# Patient Record
Sex: Male | Born: 1950 | Race: White | Hispanic: No | Marital: Married | Smoking: Former smoker
Health system: Southern US, Community
[De-identification: ages and names within clinical notes are randomized; demographics above are authoritative.]

## PROBLEM LIST (undated history)

## (undated) DIAGNOSIS — C801 Malignant (primary) neoplasm, unspecified: Secondary | ICD-10-CM

## (undated) DIAGNOSIS — K219 Gastro-esophageal reflux disease without esophagitis: Secondary | ICD-10-CM

## (undated) DIAGNOSIS — E785 Hyperlipidemia, unspecified: Secondary | ICD-10-CM

## (undated) DIAGNOSIS — M1990 Unspecified osteoarthritis, unspecified site: Secondary | ICD-10-CM

## (undated) DIAGNOSIS — T7840XA Allergy, unspecified, initial encounter: Secondary | ICD-10-CM

## (undated) DIAGNOSIS — R519 Headache, unspecified: Secondary | ICD-10-CM

## (undated) DIAGNOSIS — I10 Essential (primary) hypertension: Secondary | ICD-10-CM

## (undated) DIAGNOSIS — L089 Local infection of the skin and subcutaneous tissue, unspecified: Secondary | ICD-10-CM

## (undated) DIAGNOSIS — Z8619 Personal history of other infectious and parasitic diseases: Secondary | ICD-10-CM

## (undated) DIAGNOSIS — Z87442 Personal history of urinary calculi: Secondary | ICD-10-CM

## (undated) DIAGNOSIS — R296 Repeated falls: Secondary | ICD-10-CM

## (undated) DIAGNOSIS — B958 Unspecified staphylococcus as the cause of diseases classified elsewhere: Secondary | ICD-10-CM

## (undated) DIAGNOSIS — H269 Unspecified cataract: Secondary | ICD-10-CM

## (undated) DIAGNOSIS — F419 Anxiety disorder, unspecified: Secondary | ICD-10-CM

## (undated) DIAGNOSIS — I8390 Asymptomatic varicose veins of unspecified lower extremity: Secondary | ICD-10-CM

## (undated) DIAGNOSIS — H9319 Tinnitus, unspecified ear: Secondary | ICD-10-CM

## (undated) DIAGNOSIS — S42002A Fracture of unspecified part of left clavicle, initial encounter for closed fracture: Secondary | ICD-10-CM

## (undated) HISTORY — PX: TONGUE SURGERY: SHX810

## (undated) HISTORY — PX: I & D EXTREMITY: SHX5045

## (undated) HISTORY — PX: BRISTOW PROCEDURE: SHX5186

## (undated) HISTORY — PX: OTHER SURGICAL HISTORY: SHX169

## (undated) HISTORY — DX: Allergy, unspecified, initial encounter: T78.40XA

## (undated) HISTORY — PX: TOTAL KNEE ARTHROPLASTY: SHX125

## (undated) HISTORY — PX: LITHOTRIPSY: SUR834

## (undated) HISTORY — PX: PICC LINE PLACE PERIPHERAL (ARMC HX): HXRAD1248

## (undated) HISTORY — PX: JOINT REPLACEMENT: SHX530

## (undated) HISTORY — PX: SHOULDER ARTHROSCOPY: SHX128

## (undated) HISTORY — PX: LEG SURGERY: SHX1003

## (undated) HISTORY — DX: Hyperlipidemia, unspecified: E78.5

## (undated) HISTORY — PX: NASAL TURBINATE REDUCTION: SHX2072

## (undated) HISTORY — DX: Unspecified cataract: H26.9

---

## 2007-03-13 ENCOUNTER — Ambulatory Visit: Payer: Self-pay | Admitting: Gastroenterology

## 2007-03-25 ENCOUNTER — Ambulatory Visit: Payer: Self-pay | Admitting: Gastroenterology

## 2007-03-25 ENCOUNTER — Encounter: Payer: Self-pay | Admitting: Gastroenterology

## 2011-10-16 DIAGNOSIS — C801 Malignant (primary) neoplasm, unspecified: Secondary | ICD-10-CM

## 2011-10-16 HISTORY — DX: Malignant (primary) neoplasm, unspecified: C80.1

## 2011-10-19 DIAGNOSIS — I8390 Asymptomatic varicose veins of unspecified lower extremity: Secondary | ICD-10-CM | POA: Insufficient documentation

## 2011-10-19 DIAGNOSIS — R0683 Snoring: Secondary | ICD-10-CM | POA: Insufficient documentation

## 2011-11-07 DIAGNOSIS — K148 Other diseases of tongue: Secondary | ICD-10-CM | POA: Insufficient documentation

## 2011-11-23 DIAGNOSIS — C029 Malignant neoplasm of tongue, unspecified: Secondary | ICD-10-CM | POA: Insufficient documentation

## 2011-11-28 DIAGNOSIS — C01 Malignant neoplasm of base of tongue: Secondary | ICD-10-CM | POA: Insufficient documentation

## 2012-04-11 DIAGNOSIS — Z9660 Presence of unspecified orthopedic joint implant: Secondary | ICD-10-CM | POA: Insufficient documentation

## 2012-04-11 DIAGNOSIS — Z9889 Other specified postprocedural states: Secondary | ICD-10-CM | POA: Insufficient documentation

## 2013-03-31 DIAGNOSIS — M899 Disorder of bone, unspecified: Secondary | ICD-10-CM | POA: Insufficient documentation

## 2013-03-31 DIAGNOSIS — S3991XA Unspecified injury of abdomen, initial encounter: Secondary | ICD-10-CM | POA: Insufficient documentation

## 2014-04-22 DIAGNOSIS — M799 Soft tissue disorder, unspecified: Secondary | ICD-10-CM | POA: Insufficient documentation

## 2014-04-22 DIAGNOSIS — M2550 Pain in unspecified joint: Secondary | ICD-10-CM | POA: Insufficient documentation

## 2014-07-07 ENCOUNTER — Encounter: Payer: Self-pay | Admitting: Emergency Medicine

## 2014-07-07 ENCOUNTER — Emergency Department (INDEPENDENT_AMBULATORY_CARE_PROVIDER_SITE_OTHER): Payer: 59

## 2014-07-07 ENCOUNTER — Emergency Department
Admission: EM | Admit: 2014-07-07 | Discharge: 2014-07-07 | Disposition: A | Discharge status: Home / Self Care | Payer: 59 | Source: Home / Self Care | Attending: Family Medicine | Admitting: Family Medicine

## 2014-07-07 DIAGNOSIS — S70359A Superficial foreign body, unspecified thigh, initial encounter: Secondary | ICD-10-CM

## 2014-07-07 DIAGNOSIS — Z1810 Retained metal fragments, unspecified: Secondary | ICD-10-CM

## 2014-07-07 DIAGNOSIS — S70259A Superficial foreign body, unspecified hip, initial encounter: Secondary | ICD-10-CM

## 2014-07-07 DIAGNOSIS — S80859A Superficial foreign body, unspecified lower leg, initial encounter: Secondary | ICD-10-CM

## 2014-07-07 DIAGNOSIS — IMO0002 Reserved for concepts with insufficient information to code with codable children: Secondary | ICD-10-CM

## 2014-07-07 DIAGNOSIS — S80851A Superficial foreign body, right lower leg, initial encounter: Secondary | ICD-10-CM

## 2014-07-07 DIAGNOSIS — S90559A Superficial foreign body, unspecified ankle, initial encounter: Secondary | ICD-10-CM

## 2014-07-07 HISTORY — DX: Local infection of the skin and subcutaneous tissue, unspecified: B95.8

## 2014-07-07 HISTORY — DX: Malignant (primary) neoplasm, unspecified: C80.1

## 2014-07-07 HISTORY — DX: Unspecified staphylococcus as the cause of diseases classified elsewhere: L08.9

## 2014-07-07 MED ORDER — SULFAMETHOXAZOLE-TRIMETHOPRIM 800-160 MG PO TABS: 2.0000 | ORAL_TABLET | Freq: Two times a day (BID) | ORAL | Status: DC

## 2014-07-07 MED ORDER — CEPHALEXIN 500 MG PO CAPS: 500.0000 mg | ORAL_CAPSULE | Freq: Four times a day (QID) | ORAL | Status: DC

## 2014-07-07 MED ORDER — TETANUS-DIPHTH-ACELL PERTUSSIS 5-2.5-18.5 LF-MCG/0.5 IM SUSP
0.5000 mL | Freq: Once | INTRAMUSCULAR | Status: AC
  Administered 2014-07-07: 0.5 mL via INTRAMUSCULAR

## 2014-07-07 NOTE — Discharge Instructions (Signed)
Thank you for coming in today. Follow up with one of the general surgery groups. Go to the emergency room if you develop a fever or significant pus from your leg. Take both antibiotics for one week. Come back as needed.   Sliver Removal You have had a sliver (splinter) removed. This has caused a wound that extends through some or all layers of the skin and possibly into the subcutaneous tissue. This is the tissue just beneath the skin. Because these wounds can not be cleaned well, it is necessary to watch closely for infection. AFTER THE PROCEDURE  If a cut (incision) was necessary to remove this, it may have been repaired for you by your caregiver either with suturing, stapling, or adhesive strips. These keep together the skin edges and allow better and faster healing. HOME CARE INSTRUCTIONS   A dressing may have been applied. This may be changed once per day or as instructed. If the dressing sticks, it may be soaked off with a gauze pad or clean cloth that has been dampened with soapy water or hydrogen peroxide.  It is difficult to remove all slivers or foreign bodies as they may break or splinter into smaller pieces. Be aware that your body will work to remove the foreign substance. That is, the foreign body may work itself out of the wound. That is normal.  Watch for signs of infection and notify your caregiver if you suspect a sliver or foreign body remains in the wound.  You may have received a recommendation to follow up with your physician or a specialist. It is very important to call for or keep follow-up appointments in order to avoid infection or other complications.  Only take over-the-counter or prescription medicines for pain, discomfort, or fever as directed by your caregiver.  If antibiotics were prescribed, be sure to finish all of the medicine. If you did not receive a tetanus shot today because you did not recall when your last one was given, check with your caregiver in  the next day or two during follow up to determine if one is needed. SEEK MEDICAL CARE IF:   The area around the wound has new or worsening redness or tenderness.  Pus is coming from the wound  There is a foul smell from the wound or dressing  The edges of a wound that had been repaired break open SEEK IMMEDIATE MEDICAL CARE IF:   Red streaks are coming from the wound  An unexplained oral temperature above 102 F (38.9 C) develops. Document Released: 09/28/2000 Document Revised: 12/24/2011 Document Reviewed: 05/17/2008 [facility] Patient Information 2015 [place], [place]. This information is not intended to replace advice given to you by your health care provider. Make sure you discuss any questions you have with your health care provider.

## 2014-07-07 NOTE — ED Notes (Signed)
Pt reports that a piece of metal hit his RLE yesterday. He c/o pain and redness in the area.

## 2014-07-07 NOTE — ED Provider Notes (Signed)
Allen Robertson is a 63 y.o. male who presents to Urgent Care today for right lower leg pain and swelling. Patient was working with metal yesterday when a small piece hit him in the lower right leg. He notes pain redness and swelling. He is concerned that he may have a retained piece of metal.  No medications used yet. 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  His last tetanus vaccination was in 2009   Past Medical History  Diagnosis Date  . Cancer     tongue  . Staph skin infection    History  Substance Use Topics  . Smoking status: Former Research scientist (life sciences)  . Smokeless tobacco: Not on file  . Alcohol Use: Yes   ROS as above Medications: No current facility-administered medications for this encounter.   Current Outpatient Prescriptions  Medication Sig Dispense Refill  . ibuprofen (ADVIL,MOTRIN) 800 MG tablet Take 800 mg by mouth every 8 (eight) hours as needed.      . Multiple Vitamins-Minerals (CENTRUM ADULTS PO) Take by mouth.      . cephALEXin (KEFLEX) 500 MG capsule Take 1 capsule (500 mg total) by mouth 4 (four) times daily.  28 capsule  0  . sulfamethoxazole-trimethoprim (SEPTRA DS) 800-160 MG per tablet Take 2 tablets by mouth 2 (two) times daily.  28 tablet  0    Exam:  BP 122/78  Pulse 81  Temp(Src) 98.2 F (36.8 C) (Oral)  Resp 18  Ht 6\' 2"  (1.88 m)  Wt 274 lb (124.286 kg)  BMI 35.16 kg/m2  SpO2 97% Gen: Well NAD Right leg: Medial distal posterior lower leg with small puncture wound with mild surrounding erythema. No fluctuance or induration no. No pus expressible. Foot motion capillary refill and pulses are intact.  Attempted foreign body removal:  Consent obtained and timeout performed.  Area cleaned with alcohol A small incision was made at the puncture wound. Small hemostats were used to explore the wound. No metallic foreign body was palpable or visible. A decision was made to discontinue the procedure at this time. Minimal blood loss less than 30 mL.    No results  found for this or any previous visit (from the past 24 hour(s)). Dg Tibia/fibula Right  07/07/2014   CLINICAL DATA:  63 year old male with potential foreign body in his lower leg. Pain swelling and redness.  EXAM: RIGHT TIBIA AND FIBULA - 2 VIEW  COMPARISON:  None.  FINDINGS: Metallic radiopaque object projects medial to the distal third of the tibia on the anterior view and on the lateral view this appears to be in the posterior leg. This measures 2-3 mm  No acute bony abnormality.  IMPRESSION: No acute bony abnormality.  Radiopaque foreign body at the posterior medial leg, overlying the distal third of the tibia measures approximately 2-3 mm.  Signed,  Dulcy Fanny. Earleen Newport, DO  [facility]  [facility]   Electronically Signed   By: Corrie Mckusick O.D.   On: 07/07/2014 20:20    Assessment and Plan: 63 y.o. male with right leg foreign body. Unable to easily remove at bedside. Tetanus vaccination updated. Patient treated with antibiotics empirically Keflex and Bactrim. Followup with general surgery for further management. Patient may need fluoroscopic guided foreign body removal versus routine management.  Discussed warning signs or symptoms. Please see discharge instructions. Patient expresses understanding.     Gregor Hams, MD 07/08/14 [phone]

## 2014-07-08 DIAGNOSIS — M799 Soft tissue disorder, unspecified: Secondary | ICD-10-CM | POA: Insufficient documentation

## 2014-07-08 DIAGNOSIS — M795 Residual foreign body in soft tissue: Secondary | ICD-10-CM | POA: Insufficient documentation

## 2015-03-16 ENCOUNTER — Other Ambulatory Visit (HOSPITAL_COMMUNITY): Payer: Self-pay | Admitting: Orthopedic Surgery

## 2015-03-16 DIAGNOSIS — M1711 Unilateral primary osteoarthritis, right knee: Secondary | ICD-10-CM

## 2015-03-25 ENCOUNTER — Ambulatory Visit (HOSPITAL_COMMUNITY)
Admission: RE | Admit: 2015-03-25 | Discharge: 2015-03-25 | Disposition: A | Discharge status: Home / Self Care | Payer: Non-veteran care | Source: Ambulatory Visit | Attending: Orthopedic Surgery | Admitting: Orthopedic Surgery

## 2015-03-25 ENCOUNTER — Encounter (HOSPITAL_COMMUNITY)
Admission: RE | Admit: 2015-03-25 | Discharge: 2015-03-25 | Disposition: A | Discharge status: Home / Self Care | Payer: Non-veteran care | Source: Ambulatory Visit | Attending: Orthopedic Surgery | Admitting: Orthopedic Surgery

## 2015-03-25 DIAGNOSIS — M1711 Unilateral primary osteoarthritis, right knee: Secondary | ICD-10-CM

## 2015-03-25 DIAGNOSIS — Z96651 Presence of right artificial knee joint: Secondary | ICD-10-CM | POA: Diagnosis not present

## 2015-03-25 MED ORDER — TECHNETIUM TC 99M MEDRONATE IV KIT
25.0000 | PACK | Freq: Once | INTRAVENOUS | Status: AC | PRN
  Administered 2015-03-25: 25 via INTRAVENOUS

## 2015-08-31 ENCOUNTER — Encounter (HOSPITAL_COMMUNITY): Payer: Self-pay

## 2015-08-31 ENCOUNTER — Encounter (HOSPITAL_COMMUNITY)
Admission: RE | Admit: 2015-08-31 | Discharge: 2015-08-31 | Disposition: A | Discharge status: Home / Self Care | Payer: 59 | Source: Ambulatory Visit | Attending: Orthopedic Surgery | Admitting: Orthopedic Surgery

## 2015-08-31 DIAGNOSIS — Z01812 Encounter for preprocedural laboratory examination: Secondary | ICD-10-CM | POA: Insufficient documentation

## 2015-08-31 HISTORY — DX: Personal history of other infectious and parasitic diseases: Z86.19

## 2015-08-31 HISTORY — DX: Repeated falls: R29.6

## 2015-08-31 HISTORY — DX: Tinnitus, unspecified ear: H93.19

## 2015-08-31 HISTORY — DX: Personal history of urinary calculi: Z87.442

## 2015-08-31 HISTORY — DX: Asymptomatic varicose veins of unspecified lower extremity: I83.90

## 2015-08-31 HISTORY — DX: Fracture of unspecified part of left clavicle, initial encounter for closed fracture: S42.002A

## 2015-08-31 HISTORY — DX: Gastro-esophageal reflux disease without esophagitis: K21.9

## 2015-08-31 LAB — URINALYSIS, ROUTINE W REFLEX MICROSCOPIC
Bilirubin Urine: NEGATIVE
Glucose, UA: NEGATIVE mg/dL
HGB URINE DIPSTICK: NEGATIVE
Ketones, ur: NEGATIVE mg/dL
Leukocytes, UA: NEGATIVE
Nitrite: NEGATIVE
PH: 6.5 (ref 5.0–8.0)
Protein, ur: NEGATIVE mg/dL
SPECIFIC GRAVITY, URINE: 1.017 (ref 1.005–1.030)

## 2015-08-31 LAB — SURGICAL PCR SCREEN
MRSA, PCR: NEGATIVE
STAPHYLOCOCCUS AUREUS: NEGATIVE

## 2015-08-31 LAB — PROTIME-INR
INR: 1.12 (ref 0.00–1.49)
PROTHROMBIN TIME: 14.6 s (ref 11.6–15.2)

## 2015-08-31 LAB — BASIC METABOLIC PANEL
ANION GAP: 7 (ref 5–15)
BUN: 18 mg/dL (ref 6–20)
CALCIUM: 9.5 mg/dL (ref 8.9–10.3)
CO2: 27 mmol/L (ref 22–32)
CREATININE: 1.14 mg/dL (ref 0.61–1.24)
Chloride: 105 mmol/L (ref 101–111)
GLUCOSE: 84 mg/dL (ref 65–99)
Potassium: 4.6 mmol/L (ref 3.5–5.1)
Sodium: 139 mmol/L (ref 135–145)

## 2015-08-31 LAB — TYPE AND SCREEN
ABO/RH(D): O POS
Antibody Screen: NEGATIVE

## 2015-08-31 LAB — CBC
HCT: 46.4 % (ref 39.0–52.0)
Hemoglobin: 16.2 g/dL (ref 13.0–17.0)
MCH: 33.8 pg (ref 26.0–34.0)
MCHC: 34.9 g/dL (ref 30.0–36.0)
MCV: 96.9 fL (ref 78.0–100.0)
PLATELETS: 173 10*3/uL (ref 150–400)
RBC: 4.79 MIL/uL (ref 4.22–5.81)
RDW: 12.6 % (ref 11.5–15.5)
WBC: 5.8 10*3/uL (ref 4.0–10.5)

## 2015-08-31 LAB — ABO/RH: ABO/RH(D): O POS

## 2015-08-31 LAB — APTT: APTT: 33 s (ref 24–37)

## 2015-08-31 NOTE — Progress Notes (Addendum)
Clearance note per chart per Dr Claiborne Billings 07/05/2015 and 06/08/2015  H&P per Claiborne Billings 07/05/2015 on chart

## 2015-08-31 NOTE — Patient Instructions (Signed)
Allen Robertson  08/31/2015   Your procedure is scheduled on: Monday September 12, 2015   Report to [facility]  Entrance take [place]  elevators to 3rd floor to  [place] at 5:30 AM.  Call this number if you have problems the morning of surgery [phone]   Remember: ONLY 1 PERSON MAY GO WITH YOU TO SHORT STAY TO GET  READY MORNING OF [place].  Do not eat food or drink liquids :After Midnight.     Take these medicines the morning of surgery with A SIP OF WATER: NONE                               You may not have any metal on your body including hair pins and              piercings  Do not wear jewelry,  lotions, powders or colognes, deodorant                           Men may shave face and neck.   Do not bring valuables to the hospital. [place].  Contacts, dentures or bridgework may not be worn into surgery.  Leave suitcase in the car. After surgery it may be brought to your room.    Please read over the following fact sheets you were given:MRSA INFORMATION SHEET; INCENTIVE SPIROMETER; BLOOD TRANSFUSION INFORMATION SHEET  _____________________________________________________________________             [facility] - Preparing for Surgery Before surgery, you can play an important role.  Because skin is not sterile, your skin needs to be as free of germs as possible.  You can reduce the number of germs on your skin by washing with CHG (chlorahexidine gluconate) soap before surgery.  CHG is an antiseptic cleaner which kills germs and bonds with the skin to continue killing germs even after washing. Please DO NOT use if you have an allergy to CHG or antibacterial soaps.  If your skin becomes reddened/irritated stop using the CHG and inform your nurse when you arrive at Short Stay. Do not shave (including legs and underarms) for at least 48 hours prior to the first CHG shower.  You may shave  your face/neck. Please follow these instructions carefully:  1.  Shower with CHG Soap the night before surgery and the  morning of Surgery.  2.  If you choose to wash your hair, wash your hair first as usual with your  normal  shampoo.  3.  After you shampoo, rinse your hair and body thoroughly to remove the  shampoo.                           4.  Use CHG as you would any other liquid soap.  You can apply chg directly  to the skin and wash                       Gently with a scrungie or clean washcloth.  5.  Apply the CHG Soap to your body ONLY FROM THE NECK DOWN.   Do not use on face/ open  Wound or open sores. Avoid contact with eyes, ears mouth and genitals (private parts).                       Wash face,  Genitals (private parts) with your normal soap.             6.  Wash thoroughly, paying special attention to the area where your surgery  will be performed.  7.  Thoroughly rinse your body with warm water from the neck down.  8.  DO NOT shower/wash with your normal soap after using and rinsing off  the CHG Soap.                9.  Pat yourself dry with a clean towel.            10.  Wear clean pajamas.            11.  Place clean sheets on your bed the night of your first shower and do not  sleep with pets. Day of Surgery : Do not apply any lotions/deodorants the morning of surgery.  Please wear clean clothes to the hospital/surgery center.  FAILURE TO FOLLOW THESE INSTRUCTIONS MAY RESULT IN THE CANCELLATION OF YOUR SURGERY PATIENT SIGNATURE_________________________________  NURSE SIGNATURE__________________________________  ________________________________________________________________________   Allen Robertson  An incentive spirometer is a tool that can help keep your lungs clear and active. This tool measures how well you are filling your lungs with each breath. Taking long deep breaths may help reverse or decrease the chance of developing  breathing (pulmonary) problems (especially infection) following:  A long period of time when you are unable to move or be active. BEFORE THE PROCEDURE   If the spirometer includes an indicator to show your best effort, your nurse or respiratory therapist will set it to a desired goal.  If possible, sit up straight or lean slightly forward. Try not to slouch.  Hold the incentive spirometer in an upright position. INSTRUCTIONS FOR USE   Sit on the edge of your bed if possible, or sit up as far as you can in bed or on a chair.  Hold the incentive spirometer in an upright position.  Breathe out normally.  Place the mouthpiece in your mouth and seal your lips tightly around it.  Breathe in slowly and as deeply as possible, raising the piston or the ball toward the top of the column.  Hold your breath for 3-5 seconds or for as long as possible. Allow the piston or ball to fall to the bottom of the column.  Remove the mouthpiece from your mouth and breathe out normally.  Rest for a few seconds and repeat Steps 1 through 7 at least 10 times every 1-2 hours when you are awake. Take your time and take a few normal breaths between deep breaths.  The spirometer may include an indicator to show your best effort. Use the indicator as a goal to work toward during each repetition.  After each set of 10 deep breaths, practice coughing to be sure your lungs are clear. If you have an incision (the cut made at the time of surgery), support your incision when coughing by placing a pillow or rolled up towels firmly against it. Once you are able to get out of bed, walk around indoors and cough well. You may stop using the incentive spirometer when instructed by your caregiver.  RISKS AND COMPLICATIONS  Take your time so you do not get  dizzy or light-headed.  If you are in pain, you may need to take or ask for pain medication before doing incentive spirometry. It is harder to take a deep breath if you  are having pain. AFTER USE  Rest and breathe slowly and easily.  It can be helpful to keep track of a log of your progress. Your caregiver can provide you with a simple table to help with this. If you are using the spirometer at home, follow these instructions: [place] IF:   You are having difficultly using the spirometer.  You have trouble using the spirometer as often as instructed.  Your pain medication is not giving enough relief while using the spirometer.  You develop fever of 100.5 F (38.1 C) or higher. SEEK IMMEDIATE MEDICAL CARE IF:   You cough up bloody sputum that had not been present before.  You develop fever of 102 F (38.9 C) or greater.  You develop worsening pain at or near the incision site. MAKE SURE YOU:   Understand these instructions.  Will watch your condition.  Will get help right away if you are not doing well or get worse. Document Released: 02/11/2007 Document Revised: 12/24/2011 Document Reviewed: 04/14/2007 [employer] Patient Information 2014 [employer], [place].   ________________________________________________________________________  WHAT IS A BLOOD TRANSFUSION? Blood Transfusion Information  A transfusion is the replacement of blood or some of its parts. Blood is made up of multiple cells which provide different functions.  Red blood cells carry oxygen and are used for blood loss replacement.  White blood cells fight against infection.  Platelets control bleeding.  Plasma helps clot blood.  Other blood products are available for specialized needs, such as hemophilia or other clotting disorders. BEFORE THE TRANSFUSION  Who gives blood for transfusions?   Healthy volunteers who are fully evaluated to make sure their blood is safe. This is blood bank blood. Transfusion therapy is the safest it has ever been in the practice of medicine. Before blood is taken from a donor, a complete history is taken to make sure that person has  no history of diseases nor engages in risky social behavior (examples are intravenous drug use or sexual activity with multiple partners). The donor's travel history is screened to minimize risk of transmitting infections, such as malaria. The donated blood is tested for signs of infectious diseases, such as HIV and hepatitis. The blood is then tested to be sure it is compatible with you in order to minimize the chance of a transfusion reaction. If you or a relative donates blood, this is often done in anticipation of surgery and is not appropriate for emergency situations. It takes many days to process the donated blood. RISKS AND COMPLICATIONS Although transfusion therapy is very safe and saves many lives, the main dangers of transfusion include:   Getting an infectious disease.  Developing a transfusion reaction. This is an allergic reaction to something in the blood you were given. Every precaution is taken to prevent this. The decision to have a blood transfusion has been considered carefully by your caregiver before blood is given. Blood is not given unless the benefits outweigh the risks. AFTER THE TRANSFUSION  Right after receiving a blood transfusion, you will usually feel much better and more energetic. This is especially true if your red blood cells have gotten low (anemic). The transfusion raises the level of the red blood cells which carry oxygen, and this usually causes an energy increase.  The nurse administering the transfusion will  monitor you carefully for complications. HOME CARE INSTRUCTIONS  No special instructions are needed after a transfusion. You may find your energy is better. Speak with your caregiver about any limitations on activity for underlying diseases you may have. SEEK MEDICAL CARE IF:   Your condition is not improving after your transfusion.  You develop redness or irritation at the intravenous (IV) site. SEEK IMMEDIATE MEDICAL CARE IF:  Any of the following  symptoms occur over the next 12 hours:  Shaking chills.  You have a temperature by mouth above 102 F (38.9 C), not controlled by medicine.  Chest, back, or muscle pain.  People around you feel you are not acting correctly or are confused.  Shortness of breath or difficulty breathing.  Dizziness and fainting.  You get a rash or develop hives.  You have a decrease in urine output.  Your urine turns a dark color or changes to pink, red, or brown. Any of the following symptoms occur over the next 10 days:  You have a temperature by mouth above 102 F (38.9 C), not controlled by medicine.  Shortness of breath.  Weakness after normal activity.  The white part of the eye turns yellow (jaundice).  You have a decrease in the amount of urine or are urinating less often.  Your urine turns a dark color or changes to pink, red, or brown. Document Released: 09/28/2000 Document Revised: 12/24/2011 Document Reviewed: 05/17/2008 [facility] Patient Information 2014 [place], [place].  _______________________________________________________________________

## 2015-09-10 NOTE — Anesthesia Preprocedure Evaluation (Addendum)
Anesthesia Evaluation  Patient identified by MRN, date of birth, ID band Patient awake    Reviewed: Allergy & Precautions, NPO status , Patient's Chart, lab work & pertinent test results  History of Anesthesia Complications Negative for: history of anesthetic complications  Airway Mallampati: II  TM Distance: >3 FB Neck ROM: Full    Dental  (+) Caps, Dental Advisory Given   Pulmonary former smoker,    Pulmonary exam normal        Cardiovascular negative cardio ROS Normal cardiovascular exam     Neuro/Psych negative neurological ROS  negative psych ROS   GI/Hepatic negative GI ROS, Neg liver ROS, GERD  ,  Endo/Other  Morbid obesity  Renal/GU negative Renal ROS     Musculoskeletal   Abdominal   Peds  Hematology   Anesthesia Other Findings   Reproductive/Obstetrics                           Anesthesia Physical Anesthesia Plan  ASA: II  Anesthesia Plan: MAC and Spinal   Post-op Pain Management:    Induction:   Airway Management Planned: Simple Face Mask  Additional Equipment:   Intra-op Plan:   Post-operative Plan:   Informed Consent: I have reviewed the patients History and Physical, chart, labs and discussed the procedure including the risks, benefits and alternatives for the proposed anesthesia with the patient or authorized representative who has indicated his/her understanding and acceptance.   Dental advisory given  Plan Discussed with: CRNA, Anesthesiologist and Surgeon  Anesthesia Plan Comments:       Anesthesia Quick Evaluation

## 2015-09-11 MED ORDER — DEXTROSE 5 % IV SOLN
3.0000 g | INTRAVENOUS | Status: AC
  Administered 2015-09-12: 3 g via INTRAVENOUS
  Filled 2015-09-11: qty 3000

## 2015-09-11 NOTE — H&P (Signed)
TOTAL KNEE REVISION ADMISSION H&P  Patient is being admitted for conversion of right UKR to TKA.  Subjective:  Chief Complaint:  Right knee pain S/P UKR  HPI: Allen Robertson, 64 y.o. male, has a history of pain and functional disability in the right knee(s) due to failed previous arthroplasty and patient has failed non-surgical conservative treatments for greater than 12 weeks to include NSAID's and/or analgesics, corticosteriod injections, viscosupplementation injections and activity modification. The indications for the revision of the total knee arthroplasty are pain after a UKR. Onset of symptoms was gradual starting 4+ years ago with gradually worsening course since that time.  Prior procedures on the right knee(s) include unicompartmental arthroplasty.  Patient currently rates pain in the right knee(s) at 9 out of 10 with activity. There is night pain, worsening of pain with activity and weight bearing, pain that interferes with activities of daily living, pain with passive range of motion, crepitus and joint swelling.  Patient has evidence of previous UKR by imaging studies. This condition presents safety issues increasing the risk of falls.  There is no current active infection.  Risks, benefits and expectations were discussed with the patient.  Risks including but not limited to the risk of anesthesia, blood clots, nerve damage, blood vessel damage, failure of the prosthesis, infection and up to and including death.  Patient understand the risks, benefits and expectations and wishes to proceed with surgery.   PCP: Olin Hauser, MD  D/C Plans:      Home with HHPT  Post-op Meds:       No Rx given  Tranexamic Acid:      To be given - IV  Decadron:      Is to be given  FYI:     ASA   Norco (ok per patient)     Past Medical History  Diagnosis Date  . Cancer (HCC)     tongue  . Staph skin infection   . Tinnitus   . History of kidney stones   . GERD (gastroesophageal reflux  disease)   . History of chicken pox   . Fracture of left clavicle     2000 / also had infection had to have IV infusion therapy   . Recurrent falls   . Varicose veins     Past Surgical History  Procedure Laterality Date  . Shoulder arthroscopy Left   . Total knee arthroplasty Right     partial  . Tongue surgery      robotic assisted with bilateral neck dissection 11/2011  . Leg surgery Right   . Bristow procedure      left shoulder   . I&d extremity      right lower leg   . Picc line place peripheral ([facility] hx)      history of / secondary to right lower leg infection   . Lithotripsy    . Venous ablation right thigh       2009    No prescriptions prior to admission   Allergies  Allergen Reactions  . Codeine Hives  . Percocet [Oxycodone-Acetaminophen] Rash    Social History  Substance Use Topics  . Smoking status: Former Smoker -- 1.00 packs/day for 9 years    Types: Cigarettes    Quit date: 10/15/1980  . Smokeless tobacco: Former Systems developer    Types: Chew    Quit date: 10/16/2007  . Alcohol Use: Yes     Comment: socially     Family History  Problem Relation  Age of Onset  . Hypertension Mother   . Leukemia Father      Review of Systems  Constitutional: Negative.   HENT: Positive for tinnitus.   Eyes: Negative.   Respiratory: Negative.   Cardiovascular: Negative.   Gastrointestinal: Positive for heartburn.  Genitourinary: Negative.   Musculoskeletal: Positive for joint pain.  Skin: Negative.   Neurological: Negative.   Endo/Heme/Allergies: Negative.      Objective:  Physical Exam  Constitutional: He is oriented to person, place, and time. He appears well-developed and well-nourished.  HENT:  Head: Normocephalic and atraumatic.  Eyes: Pupils are equal, round, and reactive to light.  Neck: Neck supple. No JVD present. No tracheal deviation present. No thyromegaly present.  Cardiovascular: Normal rate, regular rhythm, normal heart sounds and intact distal  pulses.   Respiratory: Effort normal and breath sounds normal. No stridor. No respiratory distress. He has no wheezes.  GI: Soft. There is no tenderness. There is no guarding.  Musculoskeletal:       Right knee: He exhibits swelling, laceration (healed previous incision) and bony tenderness. He exhibits no ecchymosis, no deformity and normal alignment. Tenderness found.  Lymphadenopathy:    He has no cervical adenopathy.  Neurological: He is alert and oriented to person, place, and time.  Skin: Skin is warm and dry.  Psychiatric: He has a normal mood and affect.      Labs:  Estimated body mass index is 35.16 kg/(m^2) as calculated from the following:   Height as of 07/07/14: 6\' 2"  (1.88 m).   Weight as of 07/07/14: 124.286 kg (274 lb).  Imaging Review Plain radiographs demonstrate previous UKR of the right knee(s). The overall alignment is neutral.  The bone quality appears to be good for age and reported activity level.   Assessment/Plan:  Right knee with failed previous UKR.   The patient history, physical examination, clinical judgment of the provider and imaging studies are consistent with failed UKR of the right knee. Revision total knee arthroplasty is deemed medically necessary. The treatment options including medical management, injection therapy, arthroscopy and revision arthroplasty were discussed at length. The risks and benefits of revision total knee arthroplasty were presented and reviewed. The risks due to aseptic loosening, infection, stiffness, patella tracking problems, thromboembolic complications and other imponderables were discussed. The patient acknowledged the explanation, agreed to proceed with the plan and consent was signed. Patient is being admitted for inpatient treatment for surgery, pain control, PT, OT, prophylactic antibiotics, VTE prophylaxis, progressive ambulation and ADL's and discharge planning.The patient is planning to be discharged home with home  health services.     West Pugh Jocabed Cheese   PA-C  09/11/2015, 8:54 PM

## 2015-09-12 ENCOUNTER — Inpatient Hospital Stay (HOSPITAL_COMMUNITY): Payer: Non-veteran care | Admitting: Anesthesiology

## 2015-09-12 ENCOUNTER — Encounter (HOSPITAL_COMMUNITY): Payer: Self-pay | Admitting: *Deleted

## 2015-09-12 ENCOUNTER — Inpatient Hospital Stay (HOSPITAL_COMMUNITY)
Admission: RE | Admit: 2015-09-12 | Discharge: 2015-09-13 | DRG: 468 | Disposition: A | Discharge status: Home Health | Payer: Non-veteran care | Source: Ambulatory Visit | Attending: Orthopedic Surgery | Admitting: Orthopedic Surgery

## 2015-09-12 ENCOUNTER — Encounter (HOSPITAL_COMMUNITY)
Admission: RE | Disposition: A | Discharge status: Home Health | Payer: Self-pay | Source: Ambulatory Visit | Attending: Orthopedic Surgery

## 2015-09-12 DIAGNOSIS — Z9660 Presence of unspecified orthopedic joint implant: Secondary | ICD-10-CM | POA: Insufficient documentation

## 2015-09-12 DIAGNOSIS — K219 Gastro-esophageal reflux disease without esophagitis: Secondary | ICD-10-CM | POA: Diagnosis present

## 2015-09-12 DIAGNOSIS — M1711 Unilateral primary osteoarthritis, right knee: Secondary | ICD-10-CM | POA: Diagnosis present

## 2015-09-12 DIAGNOSIS — Z9889 Other specified postprocedural states: Secondary | ICD-10-CM

## 2015-09-12 DIAGNOSIS — E669 Obesity, unspecified: Secondary | ICD-10-CM | POA: Diagnosis present

## 2015-09-12 DIAGNOSIS — M25561 Pain in right knee: Secondary | ICD-10-CM | POA: Diagnosis present

## 2015-09-12 DIAGNOSIS — Y792 Prosthetic and other implants, materials and accessory orthopedic devices associated with adverse incidents: Secondary | ICD-10-CM | POA: Diagnosis present

## 2015-09-12 DIAGNOSIS — T84092A Other mechanical complication of internal right knee prosthesis, initial encounter: Secondary | ICD-10-CM | POA: Diagnosis present

## 2015-09-12 DIAGNOSIS — Z96651 Presence of right artificial knee joint: Secondary | ICD-10-CM

## 2015-09-12 DIAGNOSIS — Z87442 Personal history of urinary calculi: Secondary | ICD-10-CM | POA: Diagnosis not present

## 2015-09-12 DIAGNOSIS — Z6834 Body mass index (BMI) 34.0-34.9, adult: Secondary | ICD-10-CM | POA: Diagnosis not present

## 2015-09-12 DIAGNOSIS — Z87891 Personal history of nicotine dependence: Secondary | ICD-10-CM | POA: Diagnosis not present

## 2015-09-12 HISTORY — PX: TOTAL KNEE REVISION: SHX996

## 2015-09-12 SURGERY — TOTAL KNEE REVISION
Anesthesia: Monitor Anesthesia Care | Site: Knee | Laterality: Right

## 2015-09-12 MED ORDER — ONDANSETRON HCL 4 MG PO TABS: 4.0000 mg | ORAL_TABLET | Freq: Four times a day (QID) | ORAL | Status: DC | PRN

## 2015-09-12 MED ORDER — SODIUM CHLORIDE 0.9 % IJ SOLN
INTRAMUSCULAR | Status: AC
  Filled 2015-09-12: qty 50

## 2015-09-12 MED ORDER — SODIUM CHLORIDE 0.9 % IJ SOLN
INTRAMUSCULAR | Status: DC | PRN
  Administered 2015-09-12: 30 mL

## 2015-09-12 MED ORDER — FERROUS SULFATE 325 (65 FE) MG PO TABS
325.0000 mg | ORAL_TABLET | Freq: Three times a day (TID) | ORAL | Status: DC
  Administered 2015-09-13 (×2): 325 mg via ORAL
  Filled 2015-09-12 (×4): qty 1

## 2015-09-12 MED ORDER — MIDAZOLAM HCL 2 MG/2ML IJ SOLN
INTRAMUSCULAR | Status: AC
  Filled 2015-09-12: qty 2

## 2015-09-12 MED ORDER — TOBRAMYCIN SULFATE 1.2 G IJ SOLR
INTRAMUSCULAR | Status: AC
  Filled 2015-09-12: qty 2.4

## 2015-09-12 MED ORDER — POTASSIUM CHLORIDE 2 MEQ/ML IV SOLN
INTRAVENOUS | Status: DC
  Administered 2015-09-12 – 2015-09-13 (×3): via INTRAVENOUS
  Filled 2015-09-12 (×6): qty 1000

## 2015-09-12 MED ORDER — GLYCOPYRROLATE 0.2 MG/ML IJ SOLN
INTRAMUSCULAR | Status: DC | PRN
  Administered 2015-09-12 (×2): 0.1 mg via INTRAVENOUS

## 2015-09-12 MED ORDER — KETOROLAC TROMETHAMINE 30 MG/ML IJ SOLN
INTRAMUSCULAR | Status: DC | PRN
  Administered 2015-09-12: 30 mg via INTRAVENOUS

## 2015-09-12 MED ORDER — HYDROMORPHONE HCL 1 MG/ML IJ SOLN
0.5000 mg | INTRAMUSCULAR | Status: DC | PRN
  Administered 2015-09-12 (×2): 1 mg via INTRAVENOUS
  Filled 2015-09-12 (×3): qty 1

## 2015-09-12 MED ORDER — ASPIRIN EC 325 MG PO TBEC
325.0000 mg | DELAYED_RELEASE_TABLET | Freq: Two times a day (BID) | ORAL | Status: DC
  Administered 2015-09-12 – 2015-09-13 (×2): 325 mg via ORAL
  Filled 2015-09-12 (×4): qty 1

## 2015-09-12 MED ORDER — PROPOFOL 10 MG/ML IV BOLUS
INTRAVENOUS | Status: DC | PRN
  Administered 2015-09-12: 20 mg via INTRAVENOUS
  Administered 2015-09-12 (×2): 30 mg via INTRAVENOUS
  Administered 2015-09-12: 40 mg via INTRAVENOUS
  Administered 2015-09-12: 20 mg via INTRAVENOUS
  Administered 2015-09-12: 30 mg via INTRAVENOUS
  Administered 2015-09-12 (×3): 20 mg via INTRAVENOUS
  Administered 2015-09-12: 50 mg via INTRAVENOUS
  Administered 2015-09-12 (×4): 20 mg via INTRAVENOUS
  Administered 2015-09-12 (×3): 30 mg via INTRAVENOUS
  Administered 2015-09-12: 10 mg via INTRAVENOUS
  Administered 2015-09-12: 30 mg via INTRAVENOUS
  Administered 2015-09-12 (×2): 20 mg via INTRAVENOUS
  Administered 2015-09-12 (×2): 30 mg via INTRAVENOUS
  Administered 2015-09-12: 50 mg via INTRAVENOUS
  Administered 2015-09-12: 20 mg via INTRAVENOUS
  Administered 2015-09-12: 40 mg via INTRAVENOUS
  Administered 2015-09-12 (×2): 30 mg via INTRAVENOUS
  Administered 2015-09-12: 10 mg via INTRAVENOUS
  Administered 2015-09-12: 20 mg via INTRAVENOUS

## 2015-09-12 MED ORDER — HYDROCODONE-ACETAMINOPHEN 7.5-325 MG PO TABS
1.0000 | ORAL_TABLET | ORAL | Status: DC
  Administered 2015-09-12 – 2015-09-13 (×5): 2 via ORAL
  Filled 2015-09-12 (×7): qty 2

## 2015-09-12 MED ORDER — MAGNESIUM CITRATE PO SOLN: 1.0000 | Freq: Once | ORAL | Status: DC | PRN

## 2015-09-12 MED ORDER — DOCUSATE SODIUM 100 MG PO CAPS
100.0000 mg | ORAL_CAPSULE | Freq: Two times a day (BID) | ORAL | Status: DC
  Administered 2015-09-12 – 2015-09-13 (×2): 100 mg via ORAL

## 2015-09-12 MED ORDER — METHOCARBAMOL 1000 MG/10ML IJ SOLN
500.0000 mg | Freq: Four times a day (QID) | INTRAVENOUS | Status: DC | PRN
  Filled 2015-09-12: qty 5

## 2015-09-12 MED ORDER — BISACODYL 10 MG RE SUPP: 10.0000 mg | Freq: Every day | RECTAL | Status: DC | PRN

## 2015-09-12 MED ORDER — SODIUM CHLORIDE 0.9 % IR SOLN
Status: DC | PRN
  Administered 2015-09-12: 1000 mL

## 2015-09-12 MED ORDER — PHENOL 1.4 % MT LIQD
1.0000 | OROMUCOSAL | Status: DC | PRN
  Filled 2015-09-12: qty 177

## 2015-09-12 MED ORDER — METOCLOPRAMIDE HCL 10 MG PO TABS: 5.0000 mg | ORAL_TABLET | Freq: Three times a day (TID) | ORAL | Status: DC | PRN

## 2015-09-12 MED ORDER — TRANEXAMIC ACID 1000 MG/10ML IV SOLN
1000.0000 mg | Freq: Once | INTRAVENOUS | Status: AC
  Administered 2015-09-12: 1000 mg via INTRAVENOUS
  Filled 2015-09-12: qty 10

## 2015-09-12 MED ORDER — ONDANSETRON HCL 4 MG/2ML IJ SOLN: 4.0000 mg | Freq: Four times a day (QID) | INTRAMUSCULAR | Status: DC | PRN

## 2015-09-12 MED ORDER — METOCLOPRAMIDE HCL 5 MG/ML IJ SOLN
INTRAMUSCULAR | Status: AC
  Filled 2015-09-12: qty 2

## 2015-09-12 MED ORDER — PROPOFOL 10 MG/ML IV BOLUS
INTRAVENOUS | Status: AC
  Filled 2015-09-12: qty 20

## 2015-09-12 MED ORDER — PROMETHAZINE HCL 25 MG/ML IJ SOLN
6.2500 mg | INTRAMUSCULAR | Status: DC | PRN
Start: 2015-09-12 — End: 2015-09-12

## 2015-09-12 MED ORDER — ATORVASTATIN CALCIUM 10 MG PO TABS
10.0000 mg | ORAL_TABLET | Freq: Every day | ORAL | Status: DC
  Administered 2015-09-12: 10 mg via ORAL
  Filled 2015-09-12 (×2): qty 1

## 2015-09-12 MED ORDER — METOCLOPRAMIDE HCL 5 MG/ML IJ SOLN
INTRAMUSCULAR | Status: DC | PRN
  Administered 2015-09-12: 10 mg via INTRAVENOUS

## 2015-09-12 MED ORDER — KETAMINE HCL 10 MG/ML IJ SOLN
INTRAMUSCULAR | Status: DC | PRN
  Administered 2015-09-12: 20 mg via INTRAVENOUS

## 2015-09-12 MED ORDER — DEXAMETHASONE SODIUM PHOSPHATE 10 MG/ML IJ SOLN
INTRAMUSCULAR | Status: AC
  Filled 2015-09-12: qty 1

## 2015-09-12 MED ORDER — CELECOXIB 200 MG PO CAPS
200.0000 mg | ORAL_CAPSULE | Freq: Two times a day (BID) | ORAL | Status: DC
  Administered 2015-09-12 – 2015-09-13 (×2): 200 mg via ORAL
  Filled 2015-09-12 (×3): qty 1

## 2015-09-12 MED ORDER — PHENYLEPHRINE 40 MCG/ML (10ML) SYRINGE FOR IV PUSH (FOR BLOOD PRESSURE SUPPORT)
PREFILLED_SYRINGE | INTRAVENOUS | Status: AC
  Filled 2015-09-12: qty 10

## 2015-09-12 MED ORDER — ALUM & MAG HYDROXIDE-SIMETH 200-200-20 MG/5ML PO SUSP: 30.0000 mL | ORAL | Status: DC | PRN

## 2015-09-12 MED ORDER — BUPIVACAINE-EPINEPHRINE 0.25% -1:200000 IJ SOLN
INTRAMUSCULAR | Status: DC | PRN
  Administered 2015-09-12: 30 mL

## 2015-09-12 MED ORDER — 0.9 % SODIUM CHLORIDE (POUR BTL) OPTIME
TOPICAL | Status: DC | PRN
  Administered 2015-09-12: 1000 mL

## 2015-09-12 MED ORDER — BUPIVACAINE IN DEXTROSE 0.75-8.25 % IT SOLN
INTRATHECAL | Status: DC | PRN
  Administered 2015-09-12: 15 mg via INTRATHECAL

## 2015-09-12 MED ORDER — LIDOCAINE HCL (CARDIAC) 20 MG/ML IV SOLN
INTRAVENOUS | Status: AC
  Filled 2015-09-12: qty 5

## 2015-09-12 MED ORDER — LACTATED RINGERS IV SOLN
INTRAVENOUS | Status: DC | PRN
  Administered 2015-09-12 (×2): via INTRAVENOUS

## 2015-09-12 MED ORDER — KETAMINE HCL 10 MG/ML IJ SOLN
INTRAMUSCULAR | Status: AC
  Filled 2015-09-12: qty 1

## 2015-09-12 MED ORDER — ONDANSETRON HCL 4 MG/2ML IJ SOLN
INTRAMUSCULAR | Status: AC
  Filled 2015-09-12: qty 2

## 2015-09-12 MED ORDER — VANCOMYCIN HCL 1000 MG IV SOLR
INTRAVENOUS | Status: AC
  Filled 2015-09-12: qty 2000

## 2015-09-12 MED ORDER — ONDANSETRON HCL 4 MG/2ML IJ SOLN
INTRAMUSCULAR | Status: DC | PRN
  Administered 2015-09-12: 4 mg via INTRAVENOUS

## 2015-09-12 MED ORDER — PHENYLEPHRINE HCL 10 MG/ML IJ SOLN
INTRAMUSCULAR | Status: DC | PRN
  Administered 2015-09-12: 80 ug via INTRAVENOUS
  Administered 2015-09-12 (×3): 40 ug via INTRAVENOUS
  Administered 2015-09-12 (×2): 80 ug via INTRAVENOUS
  Administered 2015-09-12 (×2): 40 ug via INTRAVENOUS
  Administered 2015-09-12: 80 ug via INTRAVENOUS
  Administered 2015-09-12 (×2): 40 ug via INTRAVENOUS

## 2015-09-12 MED ORDER — DEXAMETHASONE SODIUM PHOSPHATE 10 MG/ML IJ SOLN
10.0000 mg | Freq: Once | INTRAMUSCULAR | Status: AC
  Administered 2015-09-13: 10 mg via INTRAVENOUS
  Filled 2015-09-12: qty 1

## 2015-09-12 MED ORDER — DIPHENHYDRAMINE HCL 25 MG PO CAPS: 25.0000 mg | ORAL_CAPSULE | Freq: Four times a day (QID) | ORAL | Status: DC | PRN

## 2015-09-12 MED ORDER — PROPOFOL 10 MG/ML IV BOLUS
INTRAVENOUS | Status: AC
  Filled 2015-09-12: qty 40

## 2015-09-12 MED ORDER — KETOROLAC TROMETHAMINE 30 MG/ML IJ SOLN
INTRAMUSCULAR | Status: AC
  Filled 2015-09-12: qty 1

## 2015-09-12 MED ORDER — MIDAZOLAM HCL 5 MG/5ML IJ SOLN
INTRAMUSCULAR | Status: DC | PRN
  Administered 2015-09-12: 2 mg via INTRAVENOUS
  Administered 2015-09-12 (×2): 1 mg via INTRAVENOUS

## 2015-09-12 MED ORDER — METOCLOPRAMIDE HCL 5 MG/ML IJ SOLN: 5.0000 mg | Freq: Three times a day (TID) | INTRAMUSCULAR | Status: DC | PRN

## 2015-09-12 MED ORDER — POLYETHYLENE GLYCOL 3350 17 G PO PACK
17.0000 g | PACK | Freq: Two times a day (BID) | ORAL | Status: DC
  Administered 2015-09-12 – 2015-09-13 (×2): 17 g via ORAL

## 2015-09-12 MED ORDER — CEFAZOLIN SODIUM-DEXTROSE 2-3 GM-% IV SOLR
2.0000 g | Freq: Four times a day (QID) | INTRAVENOUS | Status: AC
Start: 2015-09-12 — End: 2015-09-12
  Administered 2015-09-12 (×2): 2 g via INTRAVENOUS
  Filled 2015-09-12 (×2): qty 50

## 2015-09-12 MED ORDER — DEXAMETHASONE SODIUM PHOSPHATE 10 MG/ML IJ SOLN: 10.0000 mg | Freq: Once | INTRAMUSCULAR | Status: DC

## 2015-09-12 MED ORDER — MENTHOL 3 MG MT LOZG: 1.0000 | LOZENGE | OROMUCOSAL | Status: DC | PRN

## 2015-09-12 MED ORDER — HYDROMORPHONE HCL 1 MG/ML IJ SOLN: 0.2500 mg | INTRAMUSCULAR | Status: DC | PRN

## 2015-09-12 MED ORDER — CHLORHEXIDINE GLUCONATE 4 % EX LIQD: 60.0000 mL | Freq: Once | CUTANEOUS | Status: DC

## 2015-09-12 MED ORDER — METHOCARBAMOL 500 MG PO TABS: 500.0000 mg | ORAL_TABLET | Freq: Four times a day (QID) | ORAL | Status: DC | PRN

## 2015-09-12 MED ORDER — LIDOCAINE HCL (CARDIAC) 20 MG/ML IV SOLN
INTRAVENOUS | Status: DC | PRN
  Administered 2015-09-12: 50 mg via INTRAVENOUS

## 2015-09-12 MED ORDER — DEXAMETHASONE SODIUM PHOSPHATE 10 MG/ML IJ SOLN
INTRAMUSCULAR | Status: DC | PRN
  Administered 2015-09-12: 10 mg via INTRAVENOUS

## 2015-09-12 MED ORDER — BUPIVACAINE-EPINEPHRINE (PF) 0.25% -1:200000 IJ SOLN
INTRAMUSCULAR | Status: AC
  Filled 2015-09-12: qty 30

## 2015-09-12 SURGICAL SUPPLY — 56 items
BAG DECANTER FOR FLEXI CONT (MISCELLANEOUS) IMPLANT
BAG ZIPLOCK 12X15 (MISCELLANEOUS) IMPLANT
BANDAGE ELASTIC 6 VELCRO ST LF (GAUZE/BANDAGES/DRESSINGS) ×2 IMPLANT
BLADE SAW SGTL 13.0X1.19X90.0M (BLADE) ×2 IMPLANT
BLADE SAW SGTL 81X20 HD (BLADE) ×2 IMPLANT
BONE CEMENT GENTAMICIN (Cement) ×4 IMPLANT
BRUSH FEMORAL CANAL (MISCELLANEOUS) IMPLANT
CAP KNEE TOTAL 3 SIGMA ×2 IMPLANT
CEMENT BONE GENTAMICIN 40 (Cement) ×2 IMPLANT
CLOTH BEACON ORANGE TIMEOUT ST (SAFETY) ×2 IMPLANT
CUFF TOURN SGL QUICK 34 (TOURNIQUET CUFF) ×1
CUFF TRNQT CYL 34X4X40X1 (TOURNIQUET CUFF) ×1 IMPLANT
DRAPE U-SHAPE 47X51 STRL (DRAPES) ×2 IMPLANT
DRSG ADAPTIC 3X8 NADH LF (GAUZE/BANDAGES/DRESSINGS) IMPLANT
DRSG AQUACEL AG ADV 3.5X10 (GAUZE/BANDAGES/DRESSINGS) ×2 IMPLANT
DRSG PAD ABDOMINAL 8X10 ST (GAUZE/BANDAGES/DRESSINGS) IMPLANT
DRSG TEGADERM 4X4.75 (GAUZE/BANDAGES/DRESSINGS) IMPLANT
DURAPREP 26ML APPLICATOR (WOUND CARE) ×4 IMPLANT
ELECT REM PT RETURN 9FT ADLT (ELECTROSURGICAL) ×2
ELECTRODE REM PT RTRN 9FT ADLT (ELECTROSURGICAL) ×1 IMPLANT
GAUZE SPONGE 2X2 8PLY STRL LF (GAUZE/BANDAGES/DRESSINGS) IMPLANT
GAUZE SPONGE 4X4 12PLY STRL (GAUZE/BANDAGES/DRESSINGS) IMPLANT
GLOVE BIOGEL M 7.0 STRL (GLOVE) IMPLANT
GLOVE BIOGEL PI IND STRL 7.5 (GLOVE) ×1 IMPLANT
GLOVE BIOGEL PI IND STRL 8.5 (GLOVE) ×1 IMPLANT
GLOVE BIOGEL PI INDICATOR 7.5 (GLOVE) ×1
GLOVE BIOGEL PI INDICATOR 8.5 (GLOVE) ×1
GLOVE ECLIPSE 8.0 STRL XLNG CF (GLOVE) ×2 IMPLANT
GLOVE ORTHO TXT STRL SZ7.5 (GLOVE) ×4 IMPLANT
GOWN STRL REUS W/TWL LRG LVL3 (GOWN DISPOSABLE) ×2 IMPLANT
GOWN STRL REUS W/TWL XL LVL3 (GOWN DISPOSABLE) ×2 IMPLANT
HANDPIECE INTERPULSE COAX TIP (DISPOSABLE) ×1
IMMOBILIZER KNEE 20 (SOFTGOODS)
IMMOBILIZER KNEE 20 THIGH 36 (SOFTGOODS) IMPLANT
LIQUID BAND (GAUZE/BANDAGES/DRESSINGS) ×2 IMPLANT
MANIFOLD NEPTUNE II (INSTRUMENTS) ×2 IMPLANT
NS IRRIG 1000ML POUR BTL (IV SOLUTION) ×2 IMPLANT
PACK TOTAL KNEE CUSTOM (KITS) ×2 IMPLANT
PADDING CAST COTTON 6X4 STRL (CAST SUPPLIES) IMPLANT
POSITIONER SURGICAL ARM (MISCELLANEOUS) ×2 IMPLANT
SET HNDPC FAN SPRY TIP SCT (DISPOSABLE) ×1 IMPLANT
SET PAD KNEE POSITIONER (MISCELLANEOUS) ×2 IMPLANT
SPONGE GAUZE 2X2 STER 10/PKG (GAUZE/BANDAGES/DRESSINGS)
SPONGE LAP 18X18 X RAY DECT (DISPOSABLE) IMPLANT
STAPLER VISISTAT 35W (STAPLE) IMPLANT
SUT MNCRL AB 3-0 PS2 18 (SUTURE) ×2 IMPLANT
SUT VIC AB 1 CT1 36 (SUTURE) ×2 IMPLANT
SUT VIC AB 2-0 CT1 27 (SUTURE) ×3
SUT VIC AB 2-0 CT1 TAPERPNT 27 (SUTURE) ×3 IMPLANT
SUT VLOC 180 0 24IN GS25 (SUTURE) ×2 IMPLANT
SYR 50ML LL SCALE MARK (SYRINGE) ×2 IMPLANT
TOWER CARTRIDGE SMART MIX (DISPOSABLE) ×2 IMPLANT
TRAY FOLEY W/METER SILVER 16FR (SET/KITS/TRAYS/PACK) ×2 IMPLANT
TUBE KAMVAC SUCTION (TUBING) IMPLANT
WATER STERILE IRR 1500ML POUR (IV SOLUTION) ×2 IMPLANT
WRAP KNEE MAXI GEL POST OP (GAUZE/BANDAGES/DRESSINGS) ×2 IMPLANT

## 2015-09-12 NOTE — Anesthesia Postprocedure Evaluation (Signed)
Anesthesia Post Note  Patient: Allen Robertson  Procedure(s) Performed: Procedure(s) (LRB): RIGHT TOTAL KNEE REVISION (Right)  Patient location during evaluation: PACU Anesthesia Type: Spinal and MAC Level of consciousness: awake and alert Pain management: pain level controlled Vital Signs Assessment: post-procedure vital signs reviewed and stable Respiratory status: spontaneous breathing and respiratory function stable Cardiovascular status: blood pressure returned to baseline and stable Postop Assessment: Spinal receding Anesthetic complications: no    Last Vitals:  Filed Vitals:   09/12/15 1000 09/12/15 1015  BP: 124/83 119/67  Pulse: 81 77  Temp:  36.4 C  Resp: 11 11    Last Pain:  Filed Vitals:   09/12/15 1023  PainSc: 0-No pain            L Sensory Level: L5-Outer lower leg, top of foot, great toe R Sensory Level: S1-Sole of foot, small toes  Anandi Abramo DANIEL

## 2015-09-12 NOTE — Interval H&P Note (Signed)
History and Physical Interval Note:  09/12/2015 7:13 AM  Allen Robertson  has presented today for surgery, with the diagnosis of FAILED RIGHT UNI KNEE  The various methods of treatment have been discussed with the patient and family. After consideration of risks, benefits and other options for treatment, the patient has consented to  Procedure(s): RIGHT TOTAL KNEE REVISION (Right) as a surgical intervention .  The patient's history has been reviewed, patient examined, no change in status, stable for surgery.  I have reviewed the patient's chart and labs.  Questions were answered to the patient's satisfaction.     Mauri Pole

## 2015-09-12 NOTE — Brief Op Note (Signed)
09/12/2015  8:55 AM  PATIENT:  Allen Robertson  64 y.o. male  PRE-OPERATIVE DIAGNOSIS:  FAILED RIGHT UNIcompartmental  KNEE replacement  POST-OPERATIVE DIAGNOSIS:  FAILED RIGHT UNIcompartmental  KNEE replacement  PROCEDURE:  Procedure(s): RIGHT TOTAL KNEE REVISION (Right)  SURGEON:  Surgeon(s) and Role:    * Paralee Cancel, MD - Primary  PHYSICIAN ASSISTANT: Danae Orleans, PA-C  ANESTHESIA:   spinal  EBL:  Total I/O In: 1000 [I.V.:1000] Out: 575 [Urine:525; Blood:50]  BLOOD ADMINISTERED:none  DRAINS: none   LOCAL MEDICATIONS USED:  MARCAINE     SPECIMEN:  No Specimen  DISPOSITION OF SPECIMEN:  N/A  COUNTS:  YES  TOURNIQUET:   Total Tourniquet Time Documented: Thigh (Right) - 438 minutes Total: Thigh (Right) - 38 minutes   DICTATION: .Other Dictation: Dictation Number [id number]  PLAN OF CARE: Admit to inpatient   PATIENT DISPOSITION:  PACU - hemodynamically stable.   Delay start of Pharmacological VTE agent (>24hrs) due to surgical blood loss or risk of bleeding: no

## 2015-09-12 NOTE — Transfer of Care (Signed)
Immediate Anesthesia Transfer of Care Note  Patient: Allen Robertson  Procedure(s) Performed: Procedure(s): RIGHT TOTAL KNEE REVISION (Right)  Patient Location: PACU  Anesthesia Type:MAC and Spinal  Level of Consciousness: Patient easily awoken, sedated, comfortable, cooperative, following commands, responds to stimulation.   Airway & Oxygen Therapy: Patient spontaneously breathing, ventilating well, oxygen via simple oxygen mask.  Post-op Assessment: Report given to PACU RN, vital signs reviewed and stable.   Post vital signs: Reviewed and stable.  Complications: No apparent anesthesia complications

## 2015-09-12 NOTE — Anesthesia Procedure Notes (Signed)
Spinal Patient location during procedure: OR Start time: 09/12/2015 7:18 AM End time: 09/12/2015 7:28 AM Staffing Anesthesiologist: Duane Boston Performed by: anesthesiologist  Preanesthetic Checklist Completed: patient identified, surgical consent, pre-op evaluation, timeout performed, IV checked, risks and benefits discussed and monitors and equipment checked Spinal Block Patient position: sitting Prep: DuraPrep Patient monitoring: cardiac monitor, continuous pulse ox and blood pressure Approach: midline Location: L2-3 Injection technique: single-shot Needle Needle type: Pencan  Needle gauge: 24 G Needle length: 9 cm Additional Notes Functioning IV was confirmed and monitors were applied. Sterile prep and drape, including hand hygiene and sterile gloves were used. The patient was positioned and the spine was prepped. The skin was anesthetized with lidocaine.  Free flow of clear CSF was obtained prior to injecting local anesthetic into the CSF.  The spinal needle aspirated freely following injection.  The needle was carefully withdrawn.  The patient tolerated the procedure well.

## 2015-09-12 NOTE — Evaluation (Addendum)
Physical Therapy Evaluation Patient Details Name: Allen Robertson MRN: [id number] DOB: Oct 14, 1951 Today's Date: 09/12/2015   History of Present Illness  R UKA conversion to TKA   Clinical Impression  Pt is s/p TKA resulting in the deficits listed below (see PT Problem List). Pt ambulated 7' with RW and min/guard assist, initiated TKA exercises. Good progress expected Pt will benefit from skilled PT to increase their independence and safety with mobility to allow discharge to the venue listed below.      Follow Up Recommendations Home health PT    Equipment Recommendations  Rolling walker with 5" wheels    Recommendations for Other Services       Precautions / Restrictions Precautions Precautions: Knee Restrictions Weight Bearing Restrictions: No Other Position/Activity Restrictions: WBAT      Mobility  Bed Mobility Overal bed mobility: Needs Assistance Bed Mobility: Supine to Sit     Supine to sit: Min assist     General bed mobility comments: support for RLE  Transfers Overall transfer level: Needs assistance Equipment used: Rolling walker (2 wheeled) Transfers: Sit to/from Stand Sit to Stand: Min assist;From elevated surface         General transfer comment: min A to rise  Ambulation/Gait Ambulation/Gait assistance: Min guard Ambulation Distance (Feet): 60 Feet Assistive device: Rolling walker (2 wheeled) Gait Pattern/deviations: Step-to pattern;Antalgic   Gait velocity interpretation: Below normal speed for age/gender General Gait Details: verbal cues for sequencing and to lift head, distance limited by fatigue  Stairs            Wheelchair Mobility    Modified Rankin (Stroke Patients Only)       Balance Overall balance assessment: Modified Independent                                           Pertinent Vitals/Pain Pain Assessment: 0-10 Pain Score: 4  Pain Location: R knee Pain Descriptors / Indicators:  Sore Pain Intervention(s): Premedicated before session;Monitored during session;Limited activity within patient's tolerance;Ice applied    Home Living Family/patient expects to be discharged to:: Private residence Living Arrangements: Spouse/significant other Available Help at Discharge: Family;Available 24 hours/day Type of Home: House Home Access: Stairs to enter Entrance Stairs-Rails: Right Entrance Stairs-Number of Steps: 2 Home Layout: One level Home Equipment: Shower seat - built in;Crutches;Cane - single point      Prior Function Level of Independence: Independent               Hand Dominance        Extremity/Trunk Assessment                         Communication   Communication: No difficulties  Cognition Arousal/Alertness: Awake/alert Behavior During Therapy: WFL for tasks assessed/performed Overall Cognitive Status: Within Functional Limits for tasks assessed                      General Comments      Exercises Total Joint Exercises Ankle Circles/Pumps: AROM;Both;10 reps;Supine Quad Sets: AROM;Both;10 reps;Supine Heel Slides: AAROM;Right;10 reps;Supine      Assessment/Plan    PT Assessment Patient needs continued PT services  PT Diagnosis Difficulty walking;Acute pain   PT Problem List Decreased strength;Decreased range of motion;Pain;Decreased activity tolerance;Decreased mobility;Decreased knowledge of use of DME  PT Treatment Interventions DME instruction;Gait training;Stair training;Functional mobility training;Therapeutic  activities;Patient/family education;Therapeutic exercise   PT Goals (Current goals can be found in the Care Plan section) Acute Rehab PT Goals Patient Stated Goal: to play golf PT Goal Formulation: With patient/family Time For Goal Achievement: 09/16/15 Potential to Achieve Goals: Good    Frequency 7X/week   Barriers to discharge        Co-evaluation               End of Session Equipment  Utilized During Treatment: Gait belt Activity Tolerance: Patient tolerated treatment well Patient left: in chair;with call bell/phone within reach Nurse Communication: Mobility status         Time: [id number] PT Time Calculation (min) (ACUTE ONLY): 24 min   Charges:   PT Evaluation $Initial PT Evaluation Tier I: 1 Procedure PT Treatments $Gait Training: 8-22 mins   PT G Codes:        Philomena Doheny 09/12/2015, 4:44 PM [phone]

## 2015-09-12 NOTE — Discharge Instructions (Signed)

## 2015-09-13 DIAGNOSIS — E669 Obesity, unspecified: Secondary | ICD-10-CM | POA: Diagnosis present

## 2015-09-13 LAB — CBC
HEMATOCRIT: 37.5 % — AB (ref 39.0–52.0)
Hemoglobin: 13 g/dL (ref 13.0–17.0)
MCH: 33.3 pg (ref 26.0–34.0)
MCHC: 34.7 g/dL (ref 30.0–36.0)
MCV: 96.2 fL (ref 78.0–100.0)
PLATELETS: 170 10*3/uL (ref 150–400)
RBC: 3.9 MIL/uL — ABNORMAL LOW (ref 4.22–5.81)
RDW: 12.7 % (ref 11.5–15.5)
WBC: 11.7 10*3/uL — AB (ref 4.0–10.5)

## 2015-09-13 LAB — BASIC METABOLIC PANEL
ANION GAP: 6 (ref 5–15)
BUN: 15 mg/dL (ref 6–20)
CALCIUM: 8.7 mg/dL — AB (ref 8.9–10.3)
CO2: 25 mmol/L (ref 22–32)
CREATININE: 0.73 mg/dL (ref 0.61–1.24)
Chloride: 107 mmol/L (ref 101–111)
GFR calc Af Amer: >60 mL/min (ref >60)
GLUCOSE: 145 mg/dL — AB (ref 65–99)
Potassium: 4.3 mmol/L (ref 3.5–5.1)
Sodium: 138 mmol/L (ref 135–145)

## 2015-09-13 MED ORDER — FERROUS SULFATE 325 (65 FE) MG PO TABS: 325.0000 mg | ORAL_TABLET | Freq: Three times a day (TID) | ORAL | Status: DC

## 2015-09-13 MED ORDER — DOCUSATE SODIUM 100 MG PO CAPS: 100.0000 mg | ORAL_CAPSULE | Freq: Two times a day (BID) | ORAL | Status: DC

## 2015-09-13 MED ORDER — METHOCARBAMOL 500 MG PO TABS: 500.0000 mg | ORAL_TABLET | Freq: Four times a day (QID) | ORAL | Status: DC | PRN

## 2015-09-13 MED ORDER — POLYETHYLENE GLYCOL 3350 17 G PO PACK: 17.0000 g | PACK | Freq: Two times a day (BID) | ORAL | Status: DC

## 2015-09-13 MED ORDER — ASPIRIN 325 MG PO TBEC: 325.0000 mg | DELAYED_RELEASE_TABLET | Freq: Two times a day (BID) | ORAL | Status: AC

## 2015-09-13 MED ORDER — HYDROCODONE-ACETAMINOPHEN 7.5-325 MG PO TABS: 1.0000 | ORAL_TABLET | ORAL | Status: DC | PRN

## 2015-09-13 NOTE — Progress Notes (Signed)
     Subjective: 1 Day Post-Op Procedure(s) (LRB): RIGHT TOTAL KNEE REVISION (Right)   Patient reports pain as mild, pain controlled. No events throughout the night.  Waiting on delivery of DME equipment to the room.  Ready to be discharged home.  Objective:   VITALS:   Filed Vitals:   09/13/15 0544 09/13/15 1056  BP: 122/70 152/80  Pulse: 75 83  Temp: 98.3 F (36.8 C) 98.6 F (37 C)  Resp: 18 16    Dorsiflexion/Plantar flexion intact Incision: dressing C/D/I No cellulitis present Compartment soft  LABS  Recent Labs  09/13/15 0430  HGB 13.0  HCT 37.5*  WBC 11.7*  PLT 170     Recent Labs  09/13/15 0430  NA 138  K 4.3  BUN 15  CREATININE 0.73  GLUCOSE 145*     Assessment/Plan: 1 Day Post-Op Procedure(s) (LRB): RIGHT TOTAL KNEE REVISION (Right) Foley cath d/c'ed Discussed the Aquacel dressing and care for the dressing. Advance diet Up with therapy D/C IV fluids Discharge home with home health  Follow up in 2 weeks at [facility]. Follow up with OLIN,Lancer Thurner D in 2 weeks.  Contact information:  [facility] [address], [place] [phone]    Obese (BMI 30-39.9); Morbid Obesity (BMI >40)  Estimated body mass index is 34.01 kg/(m^2) as calculated from the following:   Height as of this encounter: 6\' 2"  (1.88 m).   Weight as of this encounter: 120.203 kg (265 lb). Patient also counseled that weight may inhibit the healing process Patient counseled that losing weight will help with future health issues         Allen Robertson. Allen Robertson   PAC  09/13/2015, 12:39 PM

## 2015-09-13 NOTE — Progress Notes (Signed)
Physical Therapy Treatment Patient Details Name: Allen Robertson MRN: [id number] DOB: 12-30-1950 Today's Date: 09/13/2015    History of Present Illness R UKA to TKA     PT Comments    Progressing very well with mobility. Practiced/reviewed exercises, gait training, and stair training. All education completed. Ready to d/c from PT standpoint.  Follow Up Recommendations  Home health PT     Equipment Recommendations  Rolling walker with 5" wheels    Recommendations for Other Services       Precautions / Restrictions Precautions Precautions: Knee Restrictions Weight Bearing Restrictions: No Other Position/Activity Restrictions: WBAT    Mobility  Bed Mobility Overal bed mobility: Needs Assistance Bed Mobility: Supine to Sit;Sit to Supine     Supine to sit: Supervision Sit to supine: Supervision   General bed mobility comments: for safety  Transfers Overall transfer level: Needs assistance Equipment used: Rolling walker (2 wheeled) Transfers: Sit to/from Stand Sit to Stand: Supervision         General transfer comment: for safety  Ambulation/Gait Ambulation/Gait assistance: Min guard Ambulation Distance (Feet): 175 Feet Assistive device: Rolling walker (2 wheeled) Gait Pattern/deviations: Step-to pattern;Step-through pattern;Antalgic     General Gait Details: close guard for safety. Some difficulty with step through gait pattern so instructed pt to continue to step to pattern until less painful and stability improves   Stairs Stairs: Yes   Stair Management: Step to pattern;Backwards;With walker;One rail Left (1 step x1, 2 steps x 1) Number of Stairs: 2 General stair comments: Initially practiced climbing one step with walker however wife reported pt actually has two steps to enter home. Practiced up and over portable steps x1 with use of L handrail.   Wheelchair Mobility    Modified Rankin (Stroke Patients Only)       Balance                                     Cognition Arousal/Alertness: Awake/alert Behavior During Therapy: WFL for tasks assessed/performed Overall Cognitive Status: Within Functional Limits for tasks assessed                      Exercises Total Joint Exercises Ankle Circles/Pumps: AROM;Both;10 reps;Supine Quad Sets: AROM;Both;10 reps;Supine Hip ABduction/ADduction: AROM;Right;10 reps;Supine Straight Leg Raises: AROM;Right;10 reps;Supine Goniometric ROM: ~10-90 degrees    General Comments        Pertinent Vitals/Pain Pain Assessment: 0-10 Pain Score: 4  Pain Location: R knee Pain Descriptors / Indicators: Sore Pain Intervention(s): Monitored during session;Ice applied;Repositioned    Home Living Family/patient expects to be discharged to:: Private residence Living Arrangements: Spouse/significant other Available Help at Discharge: Family;Available 24 hours/day         Home Equipment: Shower seat - built in;Crutches;Cane - single point      Prior Function Level of Independence: Independent          PT Goals (current goals can now be found in the care plan section) Acute Rehab PT Goals Patient Stated Goal: to play golf Progress towards PT goals: Progressing toward goals    Frequency  7X/week    PT Plan Current plan remains appropriate    Co-evaluation             End of Session   Activity Tolerance: Patient tolerated treatment well Patient left: in bed;with call bell/phone within reach;with family/visitor present     Time: [id number] PT Time  Calculation (min) (ACUTE ONLY): 26 min  Charges:  $Gait Training: 8-22 mins $Therapeutic Exercise: 8-22 mins                    G Codes:      Allen Robertson, MPT Pager: [phone]

## 2015-09-13 NOTE — Discharge Summary (Signed)
Physician Discharge Summary  Patient ID: Allen Robertson MRN: [id number] DOB/AGE: [birth year] 64 y.o.  Admit date: 09/12/2015 Discharge date:  09/13/2015  Procedures:  Procedure(s) (LRB): RIGHT TOTAL KNEE REVISION (Right)  Attending Physician:  Dr. 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 Cancer (HCC)     tongue  . Staph skin infection   . Tinnitus   . History of kidney stones   . GERD (gastroesophageal reflux disease)   . History of chicken pox   . Fracture of left clavicle     2000 / also had infection had to have IV infusion therapy   . Recurrent falls   . Varicose veins     HPI:    Allen Robertson, 64 y.o. male, has a history of pain and functional disability in the right knee(s) due to failed previous arthroplasty and patient has failed non-surgical conservative treatments for greater than 12 weeks to include NSAID's and/or analgesics, corticosteriod injections, viscosupplementation injections and activity modification. The indications for the revision of the total knee arthroplasty are pain after a UKR. Onset of symptoms was gradual starting 4+ years ago with gradually worsening course since that time. Prior procedures on the right knee(s) include unicompartmental arthroplasty. Patient currently rates pain in the right knee(s) at 9 out of 10 with activity. There is night pain, worsening of pain with activity and weight bearing, pain that interferes with activities of daily living, pain with passive range of motion, crepitus and joint swelling. Patient has evidence of previous UKR by imaging studies. This condition presents safety issues increasing the risk of falls. There is no current active infection. Risks, benefits and expectations were discussed with the patient. Risks  including but not limited to the risk of anesthesia, blood clots, nerve damage, blood vessel damage, failure of the prosthesis, infection and up to and including death. Patient understand the risks, benefits and expectations and wishes to proceed with surgery.   PCP: Olin Hauser, MD   Discharged Condition: good  Hospital Course:  Patient underwent the above stated procedure on 09/12/2015. Patient tolerated the procedure well and brought to the recovery room in good condition and subsequently to the floor.  POD #1 BP: 152/80 ; Pulse: 83 ; Temp: 98.6 F (37 C) ; Resp: 16 Patient reports pain as mild, pain controlled. No events throughout the night. Waiting on delivery of DME equipment to the room. Ready to be discharged home. Dorsiflexion/plantar flexion intact, incision: dressing C/D/I, no cellulitis present and compartment soft.   LABS  Basename    HGB     13.0  HCT     37.5    Discharge Exam: General appearance: alert, cooperative and no distress Extremities: Homans sign is negative, no sign of DVT, no edema, redness or tenderness in the calves or thighs and no ulcers, gangrene or trophic changes  Disposition: Home with follow up in 2 weeks   Follow-up Information    Follow up with Mauri Pole, MD. Schedule an appointment as soon as possible for a visit in 2 weeks.   Specialty:  Orthopedic Surgery   Contact information:   [address] [place] [address] [phone]       Follow up with [facility].   Why:  physical  therapy   Contact information:   [address] [place] [address] [phone]       Follow up with [facility].   Specialty:  General Practice   Why:  Rolling walker and 3-n-1   Contact information:   [place] [address] [phone]       Discharge Instructions    Call MD / Call 911    Complete by:  As directed   If you experience chest pain or shortness of breath,  CALL 911 and be transported to the hospital emergency room.  If you develope a fever above 101 F, pus (white drainage) or increased drainage or redness at the wound, or calf pain, call your surgeon's office.     Change dressing    Complete by:  As directed   Maintain surgical dressing until follow up in the clinic. If the edges start to pull up, may reinforce with tape. If the dressing is no longer working, may remove and cover with gauze and tape, but must keep the area dry and clean.  Call with any questions or concerns.     Constipation Prevention    Complete by:  As directed   Drink plenty of fluids.  Prune juice may be helpful.  You may use a stool softener, such as Colace (over the counter) 100 mg twice a day.  Use MiraLax (over the counter) for constipation as needed.     Diet - low sodium heart healthy    Complete by:  As directed      Discharge instructions    Complete by:  As directed   Maintain surgical dressing until follow up in the clinic. If the edges start to pull up, may reinforce with tape. If the dressing is no longer working, may remove and cover with gauze and tape, but must keep the area dry and clean.  Follow up in 2 weeks at [facility]. Call with any questions or concerns.     Increase activity slowly as tolerated    Complete by:  As directed   Weight bearing as tolerated with assist device (walker, cane, etc) as directed, use it as long as suggested by your surgeon or therapist, typically at least 4-6 weeks.     TED hose    Complete by:  As directed   Use stockings (TED hose) for 2 weeks on both leg(s).  You may remove them at night for sleeping.             Medication List    STOP taking these medications        ibuprofen 800 MG tablet  Commonly known as:  ADVIL,MOTRIN      TAKE these medications        aspirin 325 MG EC tablet  Take 1 tablet (325 mg total) by mouth 2 (two) times daily.     atorvastatin 10 MG tablet  Commonly known as:   LIPITOR  Take 10 mg by mouth daily.     calcium carbonate 1250 (500 CA) MG chewable tablet  Commonly known as:  OS-CAL  Chew 2 tablets by mouth daily.     CENTRUM ADULTS PO  Take 1 tablet by mouth daily.     docusate sodium 100 MG capsule  Commonly known as:  COLACE  Take 1 capsule (100 mg total) by mouth 2 (two) times daily.     ferrous sulfate 325 (65 FE) MG tablet  Take 1 tablet (325  mg total) by mouth 3 (three) times daily after meals.     HYDROcodone-acetaminophen 7.5-325 MG tablet  Commonly known as:  NORCO  Take 1-2 tablets by mouth every 4 (four) hours as needed for moderate pain.     methocarbamol 500 MG tablet  Commonly known as:  ROBAXIN  Take 1 tablet (500 mg total) by mouth every 6 (six) hours as needed for muscle spasms.     polyethylene glycol packet  Commonly known as:  MIRALAX / GLYCOLAX  Take 17 g by mouth 2 (two) times daily.     Probiotic Caps  Take 1 capsule by mouth daily.         Signed: [place] P[place]. Chandel Zaun   PA-C  09/13/2015, 3:13 PM

## 2015-09-13 NOTE — Evaluation (Signed)
Occupational Therapy Evaluation Patient Details Name: JORDA CUNG MRN: [id number] DOB: 1951/02/14 Today's Date: 09/13/2015    History of Present Illness R UKA to TKA    Clinical Impression   This 64 year old man was admitted for the above surgery.  All education was completed.  No further OT is needed at this time    Follow Up Recommendations  No OT follow up    Equipment Recommendations  None recommended by OT    Recommendations for Other Services       Precautions / Restrictions Precautions Precautions: Knee Restrictions Weight Bearing Restrictions: No Other Position/Activity Restrictions: WBAT      Mobility Bed Mobility   Bed Mobility: Supine to Sit     Supine to sit: Supervision     General bed mobility comments: pt managed RLE--HOB raised  Transfers Overall transfer level: Needs assistance Equipment used: Rolling walker (2 wheeled) Transfers: Sit to/from Stand Sit to Stand: Min guard         General transfer comment: for safety.  Cues for UE/LE placement    Balance                                            ADL Overall ADL's : Needs assistance/impaired     Grooming: Oral care;Supervision/safety;Standing                                 General ADL Comments: ambulated to bathroom and brushed teeth.  Pt reports he sat on commode yesterday and he feels comfortable with this transfer.  Reviewed shower transfer:  pt verbalizes sequence.  Pt has a shorter ledge at home. reviewed precautions     Vision     Perception     Praxis      Pertinent Vitals/Pain Pain Score: 3  Pain Location: R knee Pain Descriptors / Indicators: Sore Pain Intervention(s): Limited activity within patient's tolerance;Monitored during session;Premedicated before session;Repositioned (declined ice)     Hand Dominance     Extremity/Trunk Assessment Upper Extremity Assessment Upper Extremity Assessment: Overall WFL for tasks  assessed           Communication Communication Communication: No difficulties   Cognition Arousal/Alertness: Awake/alert Behavior During Therapy: WFL for tasks assessed/performed Overall Cognitive Status: Within Functional Limits for tasks assessed                     General Comments       Exercises       Shoulder Instructions      Home Living Family/patient expects to be discharged to:: Private residence Living Arrangements: Spouse/significant other Available Help at Discharge: Family;Available 24 hours/day               Bathroom Shower/Tub: Occupational psychologist: Handicapped height     Home Equipment: Shower seat - built in;Crutches;Cane - single point          Prior Functioning/Environment Level of Independence: Independent             OT Diagnosis: Acute pain   OT Problem List:     OT Treatment/Interventions:      OT Goals(Current goals can be found in the care plan section) Acute Rehab OT Goals Patient Stated Goal: to play golf  OT Frequency:  Barriers to D/C:            Co-evaluation              End of Session    Activity Tolerance: Patient tolerated treatment well Patient left: in chair;with call bell/phone within reach;with family/visitor present   Time: [id number] OT Time Calculation (min): 21 min Charges:  OT General Charges $OT Visit: 1 Procedure OT Evaluation $Initial OT Evaluation Tier I: 1 Procedure G-Codes:    Jedi Catalfamo Oct 08, 2015, 9:19 AM   Lesle Chris, OTR/L [phone] 08-Oct-2015

## 2015-09-13 NOTE — Care Management Note (Signed)
Case Management Note  Patient Details  Name: Allen Robertson MRN: [id number] Date of Birth: [birth year]  Subjective/Objective:     S/p Conversion of right partial knee arthroplasty to a right total knee arthroplasty, revision of right total knee replacement               Action/Plan: Discharge planning, spoke with patient and spouse at bedside. Has chosen [place] for [facility] services. Contacted [facility] for referral. [facility] for RW and 3-n-1, faxed orders to them at [phone], Garnette Czech. They will deliver DME to room prior to d/c today.  Expected Discharge Date:  09/14/15               Expected Discharge Plan:  [place]  In-House Referral:  NA  Discharge planning Services  CM Consult  Post Acute Care Choice:  Durable Medical Equipment, Home Health Choice offered to:  Patient  DME Arranged:  3-N-1, Walker rolling DME Agency:  [place]:  PT [facility] Agency:  [place]  Status of Service:  Completed, signed off  Medicare Important Message Given:    Date Medicare IM Given:    Medicare IM give by:    Date Additional Medicare IM Given:    Additional Medicare Important Message give by:     If discussed at [place] of Stay Meetings, dates discussed:    Additional Comments:  Guadalupe Maple, RN 09/13/2015, 1:20 PM

## 2015-09-13 NOTE — Op Note (Signed)
NAMEGIORGI, Allen NO.:  [id number]  MEDICAL RECORD NO.:  [id number]  LOCATION:  74                         FACILITY:  [facility]  PHYSICIAN:  Pietro Cassis. Alvan Dame, M.D.  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  PROCEDURE:  Conversion of right partial knee arthroplasty to a right total knee arthroplasty, revision of right total knee replacement.  COMPONENTS USED:  A DePuy rotating platform, Sigma knee System with a size 5 femur, 5 tibial tray, and a size 5 x 12.5 posterior stabilized insert.  SURGEON:  Pietro Cassis. Alvan Dame, M.D.  ASSISTANT:  Danae Orleans, PA-C.  Note that Mr. Allen Robertson was present for the entirety of the case from preoperative position, perioperative management of the operative extremity, general facilitation of the case and primary wound closure.  ANESTHESIA:  Spinal.  SPECIMENS:  None.  COMPLICATIONS:  None.  DRAINS:  None.  BLOOD LOSS:  Less than 100 mL.  TOURNIQUET:  38 minutes at 250 mmHg.  INDICATIONS FOR PROCEDURE:  Mr. Allen Robertson is a 64 year old pleasant male who had presented to the office for evaluation of painful right partial knee arthroplasty.  Workup was concerning for loosening, but no signs of infection.  He also had noted progressive degenerative joint changes. He had failed conservative measures and at this point ready to proceed with more definitive options.  We reviewed the risk of infection, DVT, component failure, need for future revision surgery in this setting.  We discussed the postoperative course and  expectations compared to partial reverse total knee arthroplasty.  He, at this point, was ready to proceed.  Consent was obtained for benefit of pain relief.  PROCEDURE IN DETAIL:  The patient was brought to the operative theater. Once adequate anesthesia, preoperative antibiotics, Ancef 1 g of tranexamic acid and 10 mg of Decadron administered, he was positioned supine on the operative table.  His right proximal thigh tourniquet was placed.  The right lower extremity was then prepped and draped in sterile fashion using a DeMayo leg holder.  A time-out was performed identifying the patient, planned procedure and extremity.  At this point, the right lower extremity was exsanguinated and tourniquet was elevated to 250 mmHg.  With the knee flexed, his old incision had demarcated.  We extended this proximal and distal for exposure in a total knee arthroplasty setting. Following soft tissue exposure, a median arthrotomy was made encountering clear synovial fluid.  I then also performed medial-based arthrotomy followed by synovectomy for exposure purposes.  The patient was noted to have advanced progressive degenerative changes of patellofemoral and lateral compartments of the knees.  At this point, attention was first directed to the patella.  The patella was everted with towel clips.  After removing the synovium around the patella, we measured the patella, precut measure would be 26 mm.  I resected  down to 15 mm and used a 41 patellar button.  The 41 button lug holes were drilled.  I removed the lateral remaining facet bone.  At this point, attention was directed to the distal femur.  The femoral canal was opened with a drill, irrigated to try to prevent fat emboli. Using an intramedullary guide, I then resected based on the distal femoral component 11 mm off the bone as the 10 mm seemed to be little bit distal on the lateral side.  At this point using an oscillating saw, the  distal femoral cut was made. At this point, as I removed the distal femoral component and then revisited the cut to make certain that it was flushed.  There was no significant bone loss on the distal femur.  At this point, the attention was directed to the tibia.  The tibia was subluxated anteriorly.  Further meniscectomies and scar debridement carried out as necessary for exposure purposes.  Then, using an extramedullary guide, using the stylus set at 10 mm off the lateral proximal tibia, the proximal tibia cut was made.  Initiating this cut, I was able then to elevate the polyethylene insert on the tibia side.  I then using the extramedullary guide, freshened up the proximal tibia cut, noting a little bit of bone loss in the proximal tibia, but not significant.  Once I made this cut, we confirmed the extension gap would be stable with a 10-mm insert and it was.  I then confirmed that the cut was perpendicular to the coronal plane using the tibial tray and alignment rod.  Attention was now redirected back to the femur.  The femur was measured to be a size 5.  The size 5 rotation block was then pinned into position using the C-clamp based on the proximal tibia cut.  The size 5 block was then pinned into position and then the anterior, posterior and chamfer cuts were then made.  Following removal of medial and lateral osteophytes, the final box cut was made based on the lateral aspect of distal femur.  Once this was done, the tibia was subluxated anteriorly and final preparation of the tibia carried out.  With the tibia subluxated anteriorly, the cut surface seemed to be best fit with size 5 tibia.  The size 5 tibial tray was then pinned into position, drilled and keel punched.  There was adequate bone medially and laterally for the keel, thus, felt that there was no need to use MBT revision tray.  I also then drilled sclerotic bone and removed some excessive fibrous tissue of the  area of the cement.  A trial reduction was now carried out with the 5 femur, 5 tibia, first a 10 and then a 12.5 insert, which I found had a stable and balanced ligaments from extension to flexion and noting that the patella tracked through the trochlea without application of pressure.  Given these findings, the trial components were removed, final components were opened on the back table.  We injected the synovial capsule junction of the knee with 0.25% Marcaine, 1 mL of Toradol and 30 mL of normal saline for total of 61 mL.  The knee was then irrigated with normal saline solution pulse lavage.  Once the cement was mixed and prepared, the final components were cemented onto dried cut surface of the bone.  The knee was brought to extension with a 12.5 insert until the cement cured.  Once the cement cured, excessive cement was removed throughout the knee.  Once I was satisfied I was unable to visualize any remaining cement, the final 12.5 insert to match the 5 femur was opened and inserted into the knee.  Tourniquet had been let down after 38 minutes.  Minor hemostasis was obtained during this process.  I then irrigated the knee with normal saline solution again for a total of 1 L.  At this point, the extensor mechanism was reapproximated using #1 Vicryl and 0 V-Loc suture with the knee in flexion.  The remainder of the wound was closed with 2-0 Vicryl and running 3-0 Monocryl.  The knee was cleaned, dried and dressed sterilely using surgical glue and an Aquacel dressing.  The patient was then brought to the recovery room in stable condition tolerating the procedure well.  Findings were reviewed with family.  The patient should stay in the hospital overnight and would be discharged to home with physical therapy either at home or outpatient therapy depending on their wishes.     Pietro Cassis Alvan Dame, M.D.     MDO/MEDQ  D:  09/12/2015  T:  09/13/2015  Job:  [id number]

## 2015-09-18 ENCOUNTER — Encounter (HOSPITAL_COMMUNITY): Payer: Self-pay

## 2015-09-18 ENCOUNTER — Emergency Department (HOSPITAL_COMMUNITY)
Admission: EM | Admit: 2015-09-18 | Discharge: 2015-09-18 | Disposition: A | Discharge status: Home / Self Care | Payer: 59 | Attending: Emergency Medicine | Admitting: Emergency Medicine

## 2015-09-18 ENCOUNTER — Emergency Department (EMERGENCY_DEPARTMENT_HOSPITAL)
Admit: 2015-09-18 | Discharge: 2015-09-18 | Disposition: A | Discharge status: Home / Self Care | Payer: 59 | Attending: Emergency Medicine | Admitting: Emergency Medicine

## 2015-09-18 ENCOUNTER — Emergency Department (HOSPITAL_COMMUNITY): Payer: 59

## 2015-09-18 DIAGNOSIS — Z9181 History of falling: Secondary | ICD-10-CM | POA: Diagnosis not present

## 2015-09-18 DIAGNOSIS — K219 Gastro-esophageal reflux disease without esophagitis: Secondary | ICD-10-CM | POA: Insufficient documentation

## 2015-09-18 DIAGNOSIS — Z87891 Personal history of nicotine dependence: Secondary | ICD-10-CM | POA: Diagnosis not present

## 2015-09-18 DIAGNOSIS — Z7982 Long term (current) use of aspirin: Secondary | ICD-10-CM | POA: Insufficient documentation

## 2015-09-18 DIAGNOSIS — G8918 Other acute postprocedural pain: Secondary | ICD-10-CM | POA: Insufficient documentation

## 2015-09-18 DIAGNOSIS — Z87442 Personal history of urinary calculi: Secondary | ICD-10-CM | POA: Insufficient documentation

## 2015-09-18 DIAGNOSIS — Z85810 Personal history of malignant neoplasm of tongue: Secondary | ICD-10-CM | POA: Insufficient documentation

## 2015-09-18 DIAGNOSIS — M7989 Other specified soft tissue disorders: Secondary | ICD-10-CM

## 2015-09-18 DIAGNOSIS — Z8781 Personal history of (healed) traumatic fracture: Secondary | ICD-10-CM | POA: Diagnosis not present

## 2015-09-18 DIAGNOSIS — Z9889 Other specified postprocedural states: Secondary | ICD-10-CM | POA: Insufficient documentation

## 2015-09-18 DIAGNOSIS — Z8619 Personal history of other infectious and parasitic diseases: Secondary | ICD-10-CM | POA: Insufficient documentation

## 2015-09-18 DIAGNOSIS — M79661 Pain in right lower leg: Secondary | ICD-10-CM | POA: Diagnosis not present

## 2015-09-18 DIAGNOSIS — Z8679 Personal history of other diseases of the circulatory system: Secondary | ICD-10-CM | POA: Insufficient documentation

## 2015-09-18 DIAGNOSIS — Z79899 Other long term (current) drug therapy: Secondary | ICD-10-CM | POA: Insufficient documentation

## 2015-09-18 DIAGNOSIS — Z8614 Personal history of Methicillin resistant Staphylococcus aureus infection: Secondary | ICD-10-CM | POA: Diagnosis not present

## 2015-09-18 DIAGNOSIS — R2241 Localized swelling, mass and lump, right lower limb: Secondary | ICD-10-CM | POA: Insufficient documentation

## 2015-09-18 LAB — BASIC METABOLIC PANEL
Anion gap: 8 (ref 5–15)
BUN: 24 mg/dL — AB (ref 6–20)
CALCIUM: 9.5 mg/dL (ref 8.9–10.3)
CO2: 24 mmol/L (ref 22–32)
CREATININE: 0.92 mg/dL (ref 0.61–1.24)
Chloride: 106 mmol/L (ref 101–111)
GFR calc non Af Amer: >60 mL/min (ref >60)
Glucose, Bld: 103 mg/dL — ABNORMAL HIGH (ref 65–99)
Potassium: 4.3 mmol/L (ref 3.5–5.1)
SODIUM: 138 mmol/L (ref 135–145)

## 2015-09-18 LAB — CBC WITH DIFFERENTIAL/PLATELET
BASOS PCT: 0 %
Basophils Absolute: 0 10*3/uL (ref 0.0–0.1)
EOS ABS: 0.3 10*3/uL (ref 0.0–0.7)
EOS PCT: 3 %
HEMATOCRIT: 42.2 % (ref 39.0–52.0)
HEMOGLOBIN: 14.6 g/dL (ref 13.0–17.0)
LYMPHS ABS: 1.7 10*3/uL (ref 0.7–4.0)
Lymphocytes Relative: 23 %
MCH: 33.1 pg (ref 26.0–34.0)
MCHC: 34.6 g/dL (ref 30.0–36.0)
MCV: 95.7 fL (ref 78.0–100.0)
MONOS PCT: 12 %
Monocytes Absolute: 0.9 10*3/uL (ref 0.1–1.0)
NEUTROS PCT: 62 %
Neutro Abs: 4.7 10*3/uL (ref 1.7–7.7)
Platelets: 229 10*3/uL (ref 150–400)
RBC: 4.41 MIL/uL (ref 4.22–5.81)
RDW: 13 % (ref 11.5–15.5)
WBC: 7.6 10*3/uL (ref 4.0–10.5)

## 2015-09-18 LAB — CK: Total CK: 55 U/L (ref 49–397)

## 2015-09-18 MED ORDER — HYDROCODONE-ACETAMINOPHEN 5-325 MG PO TABS
1.0000 | ORAL_TABLET | Freq: Once | ORAL | Status: AC
  Administered 2015-09-18: 1 via ORAL
  Filled 2015-09-18: qty 1

## 2015-09-18 MED ORDER — IOHEXOL 300 MG/ML  SOLN
100.0000 mL | Freq: Once | INTRAMUSCULAR | Status: AC | PRN
  Administered 2015-09-18: 100 mL via INTRAVENOUS

## 2015-09-18 NOTE — ED Notes (Signed)
Family at bedside. 

## 2015-09-18 NOTE — ED Notes (Signed)
Pt c/o R knee pain starting last night.  Pain score 4/10.  Pt reports having R knee replacement x 6 days ago.  Denies symptoms of infection.  Pt sts he is unable to walk, due to the pain.

## 2015-09-18 NOTE — Progress Notes (Addendum)
VASCULAR LAB PRELIMINARY  PRELIMINARY  PRELIMINARY  PRELIMINARY  Right lower extremity venous duplex completed.    Preliminary report:  There is no DVT or SVT noted in the right lower extremity. There is interstitial fluid noted in the right calf.   Cylas Falzone, RVT 09/18/2015, 11:10 AM

## 2015-09-18 NOTE — ED Provider Notes (Signed)
CSN: [id number]     Arrival date & time 09/18/15  0825 History   First MD Initiated Contact with Patient 09/18/15 0830     Chief Complaint  Patient presents with  . Knee Pain  . Post-op Problem    HPI  Mr. Neiswonger is an 64 y.o. male with history of GERD, multiple staph skin infections, who presents to the ED for evaluation of right calf pain. He had a total knee replacement on 09/12/15 that reportedly went well with no complications. He states that he stayed overnight in the hospital for one night and was discharged home with home PT. He states that since discharge he has had right calf pain but it has been tolerable and he has been doing well with PT. States that since yesterday the pain is so bad that he cannot bear weight. He states he feels like his calf is tight and very tender. He has been taking vicodin q4h around the clock which he states is helpful when at rest but walking/palpation is very tender. Denies history of blood clots. He states he is on 81mg  ASA daily but no other anticoagulation. Of note, pt has a history of infections requiring surgical debridement. Review of EMR reveals he had R knee injury after a fall in 02/2015 that became infected. He reportedly failed outpatient clinda tx and was admitted to [facility] for IV vanc and cefazolin. He states that he has also required multiple surgical debridements for different infected wounds, though it is unclear whether osteomyeltis vs. Nec fac vs other diagnosis. Pt states his current presentation feels like how it has in the past with infection. Denies fever, chills, n/v/d. Denies numbness or tingling.      Past Medical History  Diagnosis Date  . Cancer (HCC)     tongue  . Staph skin infection   . Tinnitus   . History of kidney stones   . GERD (gastroesophageal reflux disease)   . History of chicken pox   . Fracture of left clavicle     2000 / also had infection had to have IV infusion therapy   . Recurrent falls   . Varicose veins     Past Surgical History  Procedure Laterality Date  . Shoulder arthroscopy Left   . Total knee arthroplasty Right     partial  . Tongue surgery      robotic assisted with bilateral neck dissection 11/2011  . Leg surgery Right   . Bristow procedure      left shoulder   . I&d extremity      right lower leg   . Picc line place peripheral ([facility] hx)      history of / secondary to right lower leg infection   . Lithotripsy    . Venous ablation right thigh       2009  . Total knee revision Right 09/12/2015    Procedure: RIGHT TOTAL KNEE REVISION;  Surgeon: Paralee Cancel, MD;  Location: [facility];  Service: Orthopedics;  Laterality: Right;  . Joint replacement     Family History  Problem Relation Age of Onset  . Hypertension Mother   . Leukemia Father    Social History  Substance Use Topics  . Smoking status: Former Smoker -- 1.00 packs/day for 9 years    Types: Cigarettes    Quit date: 10/15/1980  . Smokeless tobacco: Former Systems developer    Types: Chew    Quit date: 10/16/2007  . Alcohol Use: Yes  Comment: socially     Review of Systems  All other systems reviewed and are negative.     Allergies  Codeine and Percocet  Home Medications   Prior to Admission medications   Medication Sig Start Date End Date Taking? Authorizing Provider  aspirin EC 325 MG EC tablet Take 1 tablet (325 mg total) by mouth 2 (two) times daily. 09/13/15 10/11/15  Danae Orleans, PA-C  atorvastatin (LIPITOR) 10 MG tablet Take 10 mg by mouth daily.    Historical Provider, MD  calcium carbonate (OS-CAL) 1250 (500 CA) MG chewable tablet Chew 2 tablets by mouth daily.    Historical Provider, MD  docusate sodium (COLACE) 100 MG capsule Take 1 capsule (100 mg total) by mouth 2 (two) times daily. 09/13/15   Danae Orleans, PA-C  ferrous sulfate 325 (65 FE) MG tablet Take 1 tablet (325 mg total) by mouth 3 (three) times daily after meals. 09/13/15   Danae Orleans, PA-C  HYDROcodone-acetaminophen (NORCO) 7.5-325  MG tablet Take 1-2 tablets by mouth every 4 (four) hours as needed for moderate pain. 09/13/15   Danae Orleans, PA-C  methocarbamol (ROBAXIN) 500 MG tablet Take 1 tablet (500 mg total) by mouth every 6 (six) hours as needed for muscle spasms. 09/13/15   Danae Orleans, PA-C  Multiple Vitamins-Minerals (CENTRUM ADULTS PO) Take 1 tablet by mouth daily.     Historical Provider, MD  polyethylene glycol (MIRALAX / GLYCOLAX) packet Take 17 g by mouth 2 (two) times daily. 09/13/15   Danae Orleans, PA-C  Probiotic CAPS Take 1 capsule by mouth daily.    Historical Provider, MD   BP 114/84 mmHg  Pulse 109  Temp(Src) 97.8 F (36.6 C) (Oral)  Resp 17  SpO2 96% Physical Exam  Constitutional: He is oriented to person, place, and time. No distress.  HENT:  Right Ear: External ear normal.  Left Ear: External ear normal.  Nose: Nose normal.  Mouth/Throat: Oropharynx is clear and moist. No oropharyngeal exudate.  Eyes: Conjunctivae and EOM are normal. Pupils are equal, round, and reactive to light.  Neck: Normal range of motion. Neck supple.  Cardiovascular: Normal rate, regular rhythm, normal heart sounds and intact distal pulses.   Pulmonary/Chest: Effort normal and breath sounds normal. No respiratory distress.  Abdominal: Soft. Bowel sounds are normal. He exhibits no distension. There is no tenderness.  Musculoskeletal:  R knee with some swelling and bruising. Intact but somewhat decreased ROM. R calf is markedly tender to palpation. Slight edema of R calf compared to L. Intact distal pulses.   Neurological: He is alert and oriented to person, place, and time. No cranial nerve deficit or sensory deficit.  Skin: Skin is warm and dry. He is not diaphoretic.  Skin of right calf is taught, though no overt erythema. Mild post surgical bruising.  Psychiatric: He has a normal mood and affect.  Nursing note and vitals reviewed.  Filed Vitals:   09/18/15 0828 09/18/15 0954 09/18/15 1108 09/18/15 1409   BP: 114/84 131/81 133/81 143/77  Pulse: 109 87 87 78  Temp: 97.8 F (36.6 C) 97.9 F (36.6 C) 98.3 F (36.8 C)   TempSrc: Oral Oral Oral   Resp: 17 20 16 17   SpO2: 96% 99% 99% 100%     ED Course  Procedures (including critical care time) Labs Review Labs Reviewed  BASIC METABOLIC PANEL - Abnormal; Notable for the following:    Glucose, Bld 103 (*)    BUN 24 (*)    All other components  within normal limits  CBC WITH DIFFERENTIAL/PLATELET  CK    Imaging Review Ct Tibia Fibula Right W Contrast  09/18/2015  CLINICAL DATA:  Knee replacement surgery 6 days ago. Severe right knee pain. Unable to walk. EXAM: CT OF THE LOWER RIGHT EXTREMITY WITHOUT CONTRAST TECHNIQUE: Multidetector CT imaging of the right lower extremity was performed according to the standard protocol. COMPARISON:  Radiographs 07/07/2014 FINDINGS: Surgical changes related to a recent total knee arthroplasty. I do not see any definite complicating features associated with the hardware. The tibial and femoral components appear well seated without complicating features. No periprosthetic fractures identified. The tibia and fibula are intact. There is a large joint effusion and there is air in the joint. Would not expect air 6 days after surgery but it is possible. If there are clinical findings for infection (erythema, rubor, elevated white blood cell count, etc) joint aspiration may be indicated. I do not see any obvious findings for cellulitis or myofasciitis involving the leg. Difficult to assess the quadriceps and patellar tendons because of artifact from the prosthesis. IMPRESSION: Surgical changes related to a recent total knee arthroplasty. No findings for periprosthetic fracture. Moderate-sized joint effusion containing gas. This is borderline for 6 days after surgery. Recommend clinical correlation with any signs of infection. Joint aspiration may be indicated. Electronically Signed   By: Marijo Sanes M.D.   On: 09/18/2015  13:03   I have personally reviewed and evaluated these images and lab results as part of my medical decision-making.   EKG Interpretation None      MDM   Final diagnoses:  Calf pain, right  Calf swelling   Knee exam nonfocal, slightly decreased ROM as expected six days postop. Given calf pain and swelling am concerned about possible dvt. If DVT study negative I will order CT to r/o soft tissue infection given pt's history, though I have low suspicion for cellulitis or nec fac.   DVT negative. CT shows moderate-sized joint effusion containing gas. Spoke to on-call ortho for Dr. Aurea Graff. Findings are to be expected at this time. No indication for aspiration or antibiotics. Pt may take muscle relaxer as needed and call ortho in the morning.     Anne Ng, PA-C 09/19/15 [place], MD 09/20/15 [phone]

## 2015-09-18 NOTE — Discharge Instructions (Signed)
You were seen in the ER today for evaluation of calf pain. Your workup was negative for blood clot or infection. We spoke to the on-call orthopedic surgeon who reiterated that calf pain after knee surgery is very normal. You may take the muscle relaxer that you have at home as needed. You do not need antibiotics at this time. Please call Dr. Aurea Graff office tomorrow morning to schedule follow-up.  Return to the ER for new or worsening symptoms.    Please obtain all of your results from medical records or have your doctors office obtain the results - share them with your doctor - you should be seen at your doctors office in the next 2 days. Call today to arrange your follow up. Take the medications as prescribed. Please review all of the medicines and only take them if you do not have an allergy to them. Please be aware that if you are taking birth control pills, taking other prescriptions, ESPECIALLY ANTIBIOTICS may make the birth control ineffective - if this is the case, either do not engage in sexual activity or use alternative methods of birth control such as condoms until you have finished the medicine and your family doctor says it is OK to restart them. If you are on a blood thinner such as COUMADIN, be aware that any other medicine that you take may cause the coumadin to either work too much, or not enough - you should have your coumadin level rechecked in next 7 days if this is the case.  ?  It is also a possibility that you have an allergic reaction to any of the medicines that you have been prescribed - Everybody reacts differently to medications and while MOST people have no trouble with most medicines, you may have a reaction such as nausea, vomiting, rash, swelling, shortness of breath. If this is the case, please stop taking the medicine immediately and contact your physician.  ?  You should return to the ER if you develop severe or worsening symptoms.

## 2015-09-18 NOTE — ED Notes (Signed)
MD at bedside. 

## 2016-07-11 ENCOUNTER — Emergency Department
Admission: EM | Admit: 2016-07-11 | Discharge: 2016-07-11 | Disposition: A | Discharge status: Home / Self Care | Payer: 59 | Source: Home / Self Care | Attending: Emergency Medicine | Admitting: Emergency Medicine

## 2016-07-11 ENCOUNTER — Emergency Department: Payer: 59

## 2016-07-11 ENCOUNTER — Encounter: Payer: Self-pay | Admitting: *Deleted

## 2016-07-11 ENCOUNTER — Emergency Department (INDEPENDENT_AMBULATORY_CARE_PROVIDER_SITE_OTHER): Payer: 59

## 2016-07-11 DIAGNOSIS — S7002XA Contusion of left hip, initial encounter: Secondary | ICD-10-CM

## 2016-07-11 DIAGNOSIS — S7001XA Contusion of right hip, initial encounter: Secondary | ICD-10-CM | POA: Diagnosis not present

## 2016-07-11 DIAGNOSIS — M545 Low back pain: Secondary | ICD-10-CM | POA: Diagnosis not present

## 2016-07-11 DIAGNOSIS — R52 Pain, unspecified: Secondary | ICD-10-CM

## 2016-07-11 DIAGNOSIS — M25552 Pain in left hip: Secondary | ICD-10-CM | POA: Diagnosis not present

## 2016-07-11 NOTE — ED Triage Notes (Signed)
Pt c/o RT hip pain x 6 days post fall. He has taken Tylenol prn and applied ice. He reports bruising on his RT hip.

## 2016-07-17 NOTE — ED Provider Notes (Addendum)
[name]    CSN: [id number] Arrival date & time: 07/11/16  1047     History   Chief Complaint Chief Complaint  Patient presents with  . Hip Pain    HPI Allen Robertson is a 65 y.o. male.  Moderate- severe, sharp R hip pain without radiation. Onset right after fall 6 days ago. Able to wt bear, but with pain. Assoc sxs: no void  Or GU problems. No cp or dyspnea. Tried tylenol, ice, with mild relief. HPI  Past Medical History:  Diagnosis Date  . Cancer (HCC)    tongue  . Fracture of left clavicle    2000 / also had infection had to have IV infusion therapy   . GERD (gastroesophageal reflux disease)   . History of chicken pox   . History of kidney stones   . Recurrent falls   . Staph skin infection   . Tinnitus   . Varicose veins     Patient Active Problem List   Diagnosis Date Noted  . Obese 09/13/2015  . 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 BRISTOW PROCEDURE     left shoulder   . I&D EXTREMITY     right lower leg   . JOINT REPLACEMENT    . LEG SURGERY Right   . LITHOTRIPSY    . PICC LINE PLACE PERIPHERAL ([facility]X)     history of / secondary to right lower leg infection   . SHOULDER ARTHROSCOPY Left   . TONGUE SURGERY     robotic assisted with bilateral neck dissection 11/2011  . TOTAL KNEE ARTHROPLASTY Right    partial  . TOTAL KNEE REVISION Right 09/12/2015   Procedure: RIGHT TOTAL KNEE REVISION;  Surgeon: Paralee Cancel, MD;  Location: [facility];  Service: Orthopedics;  Laterality: Right;  . venous ablation right thigh      2009       Home Medications    Prior to Admission medications   Medication Sig Start Date End Date Taking? Authorizing Provider  atorvastatin (LIPITOR) 10 MG tablet Take 10 mg by mouth daily.   Yes Historical Provider, MD  zolpidem (AMBIEN) 10 MG tablet Take 10 mg by mouth at bedtime as needed for sleep.   Yes  Historical Provider, MD  calcium carbonate (OS-CAL) 1250 (500 CA) MG chewable tablet Chew 2 tablets by mouth daily.    Historical Provider, MD  docusate sodium (COLACE) 100 MG capsule Take 1 capsule (100 mg total) by mouth 2 (two) times daily. Patient not taking: Reported on 09/18/2015 09/13/15   Danae Orleans, PA-C  ferrous sulfate 325 (65 FE) MG tablet Take 1 tablet (325 mg total) by mouth 3 (three) times daily after meals. 09/13/15   Danae Orleans, PA-C  Multiple Vitamins-Minerals (CENTRUM ADULTS PO) Take 1 tablet by mouth daily.     Historical Provider, MD  polyethylene glycol (MIRALAX / GLYCOLAX) packet Take 17 g by mouth 2 (two) times daily. Patient not taking: Reported on 09/18/2015 09/13/15   Danae Orleans, PA-C  Probiotic CAPS Take 1 capsule by mouth daily.    Historical Provider, MD    Family History Family History  Problem Relation Age of Onset  . Hypertension Mother   . Leukemia Father     Social History Social History  Substance Use Topics  . Smoking status: Former Smoker    Packs/day: 1.00  Years: 9.00    Types: Cigarettes    Quit date: 10/15/1980  . Smokeless tobacco: Former Systems developer    Types: Chew    Quit date: 10/16/2007  . Alcohol use Yes     Comment: socially      Allergies   Percocet [oxycodone-acetaminophen]   Review of Systems Review of Systems  All other systems reviewed and are negative.    Physical Exam Triage Vital Signs ED Triage Vitals  Enc Vitals Group     BP 07/11/16 1110 119/83     Pulse Rate 07/11/16 1110 108     Resp 07/11/16 1110 18     Temp 07/11/16 1110 98 F (36.7 C)     Temp Source 07/11/16 1110 Oral     SpO2 07/11/16 1110 96 %     Weight 07/11/16 1111 273 lb (123.8 kg)     Height 07/11/16 1111 6\' 2"  (1.88 m)     Head Circumference --      Peak Flow --      Pain Score 07/11/16 1112 3     Pain Loc --      Pain Edu? --      Excl. in [place]? --    No data found.   Updated Vital Signs BP 119/83 (BP Location: Left Arm)    Pulse 108   Temp 98 F (36.7 C) (Oral)   Resp 18   Ht 6\' 2"  (1.88 m)   Wt 273 lb (123.8 kg)   SpO2 96%   BMI 35.05 kg/m   Visual Acuity Right Eye Distance:   Left Eye Distance:   Bilateral Distance:    Right Eye Near:   Left Eye Near:    Bilateral Near:     Physical Exam  Constitutional: He is oriented to person, place, and time. He appears well-developed and well-nourished. No distress.  Uncomfortable from R hip pain. Moves slowly. Can ambulate.  HENT:  Head: Normocephalic and atraumatic.  Eyes: Pupils are equal, round, and reactive to light. No scleral icterus.  Neck: Normal range of motion. Neck supple.  Cardiovascular: Normal rate and regular rhythm.   Pulmonary/Chest: Effort normal.  Abdominal: He exhibits no distension.  Musculoskeletal:       Right hip: He exhibits decreased range of motion, tenderness, bony tenderness and swelling. He exhibits normal strength and no laceration.       Left hip: He exhibits decreased range of motion and bony tenderness.       Lumbar back: He exhibits no bony tenderness, no swelling and no deformity.       Right lower leg: He exhibits no tenderness, no swelling and no edema.       Left lower leg: He exhibits no tenderness, no swelling and no edema.       Legs: Neurological: He is alert and oriented to person, place, and time.  Skin: Skin is warm and dry. No rash noted.  Psychiatric: He has a normal mood and affect. His behavior is normal.  Nursing note and vitals reviewed. P repeated: 88, reg. Lower extremities, motor and sensory and DTR's equal and intact bilaterally.  UC Treatments / Results  Labs (all labs ordered are listed, but only abnormal results are displayed) Labs Reviewed - No data to display  EKG  EKG Interpretation None       Radiology No results found. X-ray right and left hips: DG HIP (WITH OR WITHOUT PELVIS) 3-4V BILAT  COMPARISON:  None.  FINDINGS: There is no evidence  of hip fracture or  dislocation. There is no evidence of arthropathy or other focal bone abnormality. Phlebolith noted on the left. Minimal sclerosis about the pubic symphysis slightly degenerative.  IMPRESSION: No acute osseous abnormality.  Electronically Signed   By: Ashley Royalty M.D.   On: 07/11/2016 12:39  Procedures Procedures (including critical care time)  Medications Ordered in UC Medications - No data to display   Initial Impression / Assessment and Plan / UC Course  I have reviewed the triage vital signs and the nursing notes.  Pertinent labs & imaging results that were available during my care of the patient were reviewed by me and considered in my medical decision making (see chart for details).  Clinical Course  Comment By Time  Initial evaluation of patient. X-rays ordered Jacqulyn Cane, MD 09/27 1201  X-rays of both hips are negative for acute abnormalities. We'll plan on discharge with supportive care and follow-up with PCP or orthopedist in one to 2 weeks. Anticipatory guidance given that major contusion such as this may take several weeks or longer to resolve. Red flags discussed. Patient and wife voiced understanding and agreement Discussed ice, rest and supportive care. Patient prefers to take Tylenol when necessary pain. Also, he has prescription ibuprofen 800 mg at home, advised to take 1 every 8 hours with food as needed for pain Jacqulyn Cane, MD 09/27 1258   Follow-up with your primary care doctor or ortho in 7 days if not improving, or sooner if symptoms become worse. Precautions discussed. Red flags discussed.--ER if any red flags. Questions invited and answered. Patient and wife voiced understanding and agreement.   Final Clinical Impressions(s) / UC Diagnoses   Final diagnoses:  Contusion of hip, right, initial encounter  Contusion of left hip, initial encounter   Over 25 minutes spent, greater than 50% of the time spent for counseling and coordination of  care. New Prescriptions Discharge Medication List as of 07/11/2016  1:16 PM       Jacqulyn Cane, MD 07/17/16 2056    Jacqulyn Cane, MD 07/17/16 2057

## 2016-12-28 ENCOUNTER — Encounter: Payer: Self-pay | Admitting: Physician Assistant

## 2017-01-03 ENCOUNTER — Other Ambulatory Visit (INDEPENDENT_AMBULATORY_CARE_PROVIDER_SITE_OTHER): Payer: 59

## 2017-01-03 ENCOUNTER — Encounter: Payer: Self-pay | Admitting: Physician Assistant

## 2017-01-03 ENCOUNTER — Ambulatory Visit (INDEPENDENT_AMBULATORY_CARE_PROVIDER_SITE_OTHER): Payer: 59 | Admitting: Physician Assistant

## 2017-01-03 VITALS — BP 126/86 | HR 80 | Ht 74.0 in | Wt 279.0 lb

## 2017-01-03 DIAGNOSIS — R197 Diarrhea, unspecified: Secondary | ICD-10-CM

## 2017-01-03 LAB — COMPREHENSIVE METABOLIC PANEL
ALK PHOS: 88 U/L (ref 39–117)
ALT: 22 U/L (ref 0–53)
AST: 22 U/L (ref 0–37)
Albumin: 4.3 g/dL (ref 3.5–5.2)
BILIRUBIN TOTAL: 0.5 mg/dL (ref 0.2–1.2)
BUN: 14 mg/dL (ref 6–23)
CALCIUM: 9.8 mg/dL (ref 8.4–10.5)
CO2: 26 mEq/L (ref 19–32)
CREATININE: 0.82 mg/dL (ref 0.40–1.50)
Chloride: 105 mEq/L (ref 96–112)
GFR: 100.34 mL/min (ref >60.00)
Glucose, Bld: 88 mg/dL (ref 70–99)
Potassium: 4.1 mEq/L (ref 3.5–5.1)
Sodium: 138 mEq/L (ref 135–145)
Total Protein: 7.3 g/dL (ref 6.0–8.3)

## 2017-01-03 LAB — CBC WITH DIFFERENTIAL/PLATELET
BASOS PCT: 1.3 % (ref 0.0–3.0)
Basophils Absolute: 0.1 10*3/uL (ref 0.0–0.1)
Eosinophils Absolute: 0.3 10*3/uL (ref 0.0–0.7)
Eosinophils Relative: 5.5 % — ABNORMAL HIGH (ref 0.0–5.0)
HCT: 45.9 % (ref 39.0–52.0)
Hemoglobin: 15.7 g/dL (ref 13.0–17.0)
LYMPHS PCT: 29.3 % (ref 12.0–46.0)
Lymphs Abs: 1.7 10*3/uL (ref 0.7–4.0)
MCHC: 34.2 g/dL (ref 30.0–36.0)
MCV: 97.7 fl (ref 78.0–100.0)
MONOS PCT: 8.7 % (ref 3.0–12.0)
Monocytes Absolute: 0.5 10*3/uL (ref 0.1–1.0)
NEUTROS ABS: 3.2 10*3/uL (ref 1.4–7.7)
Neutrophils Relative %: 55.2 % (ref 43.0–77.0)
PLATELETS: 183 10*3/uL (ref 150.0–400.0)
RBC: 4.7 Mil/uL (ref 4.22–5.81)
RDW: 13.2 % (ref 11.5–15.5)
WBC: 5.8 10*3/uL (ref 4.0–10.5)

## 2017-01-03 NOTE — Progress Notes (Signed)
Chief Complaint: Diarrhea  HPI:  Allen Robertson is a 66 year old Caucasian male with a past medical history of tongue cancer, GERD, staph skin infections, as well as others listed below, who was referred to me by Olin Hauser, MD for a complaint of diarrhea.     Patient has followed in clinic with Dr. Fuller Plan previously for a screening colonoscopy. This was last performed 03/25/07 with a finding of multiple polyps and internal hemorrhoids. Pathology revealed hyperplastic polyps and patient was told to return to clinic in 10 years for a repeat screening.   Today, the patient presents to clinic accompanied by his wife and tells me that normally he would have one solid bowel movement in the morning and about a month ago, this started to change to multiple bowel movements throughout the day. He tells me at first he would still have a solid bowel movement in the morning but then would have a loose stool after eating 2-3 times a day, now all of his stools are loose. Patient tells me that eating continues to exacerbate the symptom and about 20-30 minutes after eating anything he will have a loose stool. He typically only has one loose stool after eating and then is finished but occasionally can have more. Associated symptoms include a feeling of "bloated fullness", this is typically before a bowel movement and is "not all the time".   Patient's social history is positive for his wife being diagnosed with colon cancer after she changed to "dark diarrhea", so they are both somewhat concerned today.   Patient denies fever, chills, blood in the stool, melena, weight loss, fatigue, anorexia, change in diet, change in medications, recent travel, nausea, vomiting or symptoms that awaken him at night.  Past Medical History:  Diagnosis Date  . Cancer (HCC)    tongue  . Fracture of left clavicle    2000 / also had infection had to have IV infusion therapy   . GERD (gastroesophageal reflux disease)   . History of  chicken pox   . History of kidney stones   . Recurrent falls   . Staph skin infection   . Tinnitus   . Varicose veins     Past Surgical History:  Procedure Laterality Date  . BRISTOW PROCEDURE     left shoulder   . I&D EXTREMITY     right lower leg   . JOINT REPLACEMENT    . LEG SURGERY Right   . LITHOTRIPSY    . PICC LINE PLACE PERIPHERAL ([place] [facility]X)     history of / secondary to right lower leg infection   . SHOULDER ARTHROSCOPY Left   . TONGUE SURGERY     robotic assisted with bilateral neck dissection 11/2011  . TOTAL KNEE ARTHROPLASTY Right    partial  . TOTAL KNEE REVISION Right 09/12/2015   Procedure: RIGHT TOTAL KNEE REVISION;  Surgeon: Paralee Cancel, MD;  Location: [facility];  Service: Orthopedics;  Laterality: Right;  . venous ablation right thigh      2009    Current Outpatient Prescriptions  Medication Sig Dispense Refill  . atorvastatin (LIPITOR) 10 MG tablet Take 10 mg by mouth daily.    . calcium carbonate (OS-CAL) 1250 (500 CA) MG chewable tablet Chew 2 tablets by mouth daily.    . Flaxseed, Linseed, (FLAXSEED OIL) 1000 MG CAPS Take 1 capsule by mouth daily.    Marland Kitchen ibuprofen (ADVIL,MOTRIN) 800 MG tablet Take 800 mg by mouth every 8 (eight) hours as needed.    Marland Kitchen  Multiple Vitamins-Minerals (CENTRUM ADULTS PO) Take 1 tablet by mouth daily.     . Probiotic CAPS Take 1 capsule by mouth daily.    . Wheat Dextrin (BENEFIBER DRINK MIX) PACK Take by mouth daily.    Marland Kitchen zolpidem (AMBIEN) 10 MG tablet Take 10 mg by mouth at bedtime as needed for sleep.     No current facility-administered medications for this visit.     Allergies as of 01/03/2017 - Review Complete 01/03/2017  Allergen Reaction Noted  . Percocet [oxycodone-acetaminophen] Rash 07/07/2014    Family History  Problem Relation Age of Onset  . Hypertension Mother   . Leukemia Father     Social History   Social History  . Marital status: Married    Spouse name: N/A  . Number of children: 2  . Years of  education: N/A   Occupational History  . Retired    Social History Main Topics  . Smoking status: Former Smoker    Packs/day: 1.00    Years: 9.00    Types: Cigarettes    Quit date: 10/15/1980  . Smokeless tobacco: Former Systems developer    Types: Chew    Quit date: 10/16/2007  . Alcohol use Yes     Comment: socially   . Drug use: No  . Sexual activity: Not on file   Other Topics Concern  . Not on file   Social History Narrative  . No narrative on file    Review of Systems:    Constitutional: No weight loss, fever or chills Skin: No rash Cardiovascular: No chest pain Respiratory: No SOB  Gastrointestinal: See HPI and otherwise negative Genitourinary: No dysuria Neurological: No headache Musculoskeletal: No new muscle or joint pain Hematologic: No bleeding  Psychiatric: No history of depression or anxiety   Physical Exam:  Vital signs: BP 126/86 (BP Location: Left Arm, Patient Position: Sitting, Cuff Size: Large)   Pulse 80   Ht 6\' 2"  (1.88 m)   Wt 279 lb (126.6 kg)   BMI 35.82 kg/m   Constitutional:   Pleasant Caucasian male appears to be in NAD, Well developed, Well nourished, alert and cooperative Head:  Normocephalic and atraumatic. Eyes:   PEERL, EOMI. No icterus. Conjunctiva pink. Ears:  Normal auditory acuity. Neck:  Supple Throat: Oral cavity and pharynx without inflammation, swelling or lesion.  Respiratory: Respirations even and unlabored. Lungs clear to auscultation bilaterally.   No wheezes, crackles, or rhonchi.  Cardiovascular: Normal S1, S2. No MRG. Regular rate and rhythm. No peripheral edema, cyanosis or pallor.  Gastrointestinal:  Soft, nondistended, nontender. No rebound or guarding. Normal bowel sounds. No appreciable masses or hepatomegaly. Rectal:  Not performed.  Msk:  Symmetrical without gross deformities. Without edema, no deformity or joint abnormality.  Neurologic:  Alert and  oriented x4;  grossly normal neurologically.  Skin:   Dry and intact  without significant lesions or rashes. Psychiatric:  Demonstrates good judgement and reason without abnormal affect or behaviors.  No recent labs or imaging.  Assessment: 1. Diarrhea: 1 month, 3-4 times a day, exacerbated by eating, patient on a fiber supplement and probiotic which he has been on for years, no change in medications or diet, wife was diagnosed with cancer after change to diarrhea so this is concerning for the family; consider infectious versus IBS versus other  Plan: 1. Ordered labs to include a CBC, CMP and stool studies including a GI pathogen panel and O&P 2. Recommend the patient continue his fiber supplement and trial a change  in probiotics possibly to Align 3. Recommend he stay well hydrated with 6-8 8 ounce glasses of water per day 4. Did discuss that the patient is due for a screening colonoscopy in June of this year, if symptoms persist and labs are negative would recommend that we go ahead and schedule this now with Dr. Fuller Plan 5. Patient to follow in clinic per recommendations after labs  Ellouise Newer, PA-C [facility] 01/03/2017, 9:00 AM  Cc: Olin Hauser, MD

## 2017-01-03 NOTE — Patient Instructions (Signed)
Your physician has requested that you go to the basement for lab work before leaving today.  Try changing your probiotic to Align once daily.

## 2017-01-03 NOTE — Progress Notes (Signed)
Reviewed and agree with initial management plan.  Rosamae Rocque T. Emryn Flanery, MD FACG 

## 2017-01-04 LAB — OVA AND PARASITE EXAMINATION: OP: NONE SEEN

## 2017-01-07 LAB — GASTROINTESTINAL PATHOGEN PANEL PCR
C. difficile Tox A/B, PCR: NOT DETECTED
CRYPTOSPORIDIUM, PCR: NOT DETECTED
Campylobacter, PCR: NOT DETECTED
E COLI (STEC) STX1/STX2, PCR: NOT DETECTED
E coli (ETEC) LT/ST PCR: NOT DETECTED
E coli 0157, PCR: NOT DETECTED
GIARDIA LAMBLIA, PCR: NOT DETECTED
NOROVIRUS, PCR: NOT DETECTED
Rotavirus A, PCR: NOT DETECTED
Salmonella, PCR: NOT DETECTED
Shigella, PCR: NOT DETECTED

## 2017-01-29 ENCOUNTER — Encounter: Payer: Self-pay | Admitting: Gastroenterology

## 2017-05-20 ENCOUNTER — Encounter: Payer: Self-pay | Admitting: Gastroenterology

## 2017-05-27 ENCOUNTER — Observation Stay (HOSPITAL_COMMUNITY)
Admission: EM | Admit: 2017-05-27 | Discharge: 2017-05-28 | Disposition: A | Discharge status: Home / Self Care | Payer: Medicare Other | Attending: Internal Medicine | Admitting: Internal Medicine

## 2017-05-27 ENCOUNTER — Encounter (HOSPITAL_COMMUNITY): Payer: Self-pay | Admitting: Emergency Medicine

## 2017-05-27 ENCOUNTER — Emergency Department (HOSPITAL_COMMUNITY): Payer: Medicare Other

## 2017-05-27 DIAGNOSIS — Z79899 Other long term (current) drug therapy: Secondary | ICD-10-CM | POA: Diagnosis not present

## 2017-05-27 DIAGNOSIS — R079 Chest pain, unspecified: Secondary | ICD-10-CM | POA: Diagnosis not present

## 2017-05-27 DIAGNOSIS — R9431 Abnormal electrocardiogram [ECG] [EKG]: Secondary | ICD-10-CM | POA: Diagnosis present

## 2017-05-27 DIAGNOSIS — E785 Hyperlipidemia, unspecified: Secondary | ICD-10-CM | POA: Diagnosis present

## 2017-05-27 DIAGNOSIS — Z85810 Personal history of malignant neoplasm of tongue: Secondary | ICD-10-CM | POA: Insufficient documentation

## 2017-05-27 DIAGNOSIS — Z96651 Presence of right artificial knee joint: Secondary | ICD-10-CM | POA: Diagnosis not present

## 2017-05-27 DIAGNOSIS — K219 Gastro-esophageal reflux disease without esophagitis: Secondary | ICD-10-CM | POA: Diagnosis present

## 2017-05-27 DIAGNOSIS — Z87891 Personal history of nicotine dependence: Secondary | ICD-10-CM | POA: Diagnosis not present

## 2017-05-27 MED ORDER — ASPIRIN 81 MG PO CHEW
243.0000 mg | CHEWABLE_TABLET | Freq: Once | ORAL | Status: AC
  Administered 2017-05-27: 243 mg via ORAL
  Filled 2017-05-27: qty 3

## 2017-05-27 MED ORDER — NITROGLYCERIN 0.4 MG SL SUBL
0.4000 mg | SUBLINGUAL_TABLET | SUBLINGUAL | Status: DC | PRN
  Administered 2017-05-27: 0.4 mg via SUBLINGUAL
  Filled 2017-05-27: qty 1

## 2017-05-27 NOTE — ED Provider Notes (Signed)
[facility] DEPT Provider Note   CSN: [id number] Arrival date & time: 05/27/17  2254     History   Chief Complaint Chief Complaint  Patient presents with  . Chest Pain    HPI Allen Robertson is a 66 y.o. male.  The history is provided by the patient.  Chest Pain    He had some mild chest pressure last night which resolved. This evening, he had recurrence of chest pressure at about 9 PM. He rates it at 5/10. There is some numbness in his left arm, mild nausea, and he did have some diaphoresis. He denies dyspnea. He denies any exertional component. Nothing makes it better, nothing makes it worse. He states it is axes starting to feel little better now. He did take aspirin 81 mg at home. He is a former smoker having quit 30 years ago. He has a history of hyperlipidemia but no history of a diabetes or hypertension and no family history of premature coronary atherosclerosis.  Past Medical History:  Diagnosis Date  . Cancer (HCC)    tongue  . Fracture of left clavicle    2000 / also had infection had to have IV infusion therapy   . GERD (gastroesophageal reflux disease)   . History of chicken pox   . History of kidney stones   . Recurrent falls   . Staph skin infection   . Tinnitus   . Varicose veins     Patient Active Problem List   Diagnosis Date Noted  . Obese 09/13/2015  . 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 BRISTOW PROCEDURE     left shoulder   . I&D EXTREMITY     right lower leg   . JOINT REPLACEMENT    . LEG SURGERY Right   . LITHOTRIPSY    . PICC LINE PLACE PERIPHERAL ([place] [facility]X)     history of / secondary to right lower leg infection   . SHOULDER ARTHROSCOPY Left   . TONGUE SURGERY     robotic assisted with bilateral neck dissection 11/2011  . TOTAL KNEE ARTHROPLASTY Right    partial  . TOTAL KNEE REVISION Right 09/12/2015   Procedure: RIGHT TOTAL  KNEE REVISION;  Surgeon: Paralee Cancel, MD;  Location: [facility];  Service: Orthopedics;  Laterality: Right;  . venous ablation right thigh      2009       Home Medications    Prior to Admission medications   Medication Sig Start Date End Date Taking? Authorizing Provider  atorvastatin (LIPITOR) 10 MG tablet Take 10 mg by mouth daily.    [provider]  calcium carbonate (OS-CAL) 1250 (500 CA) MG chewable tablet Chew 2 tablets by mouth daily.    [provider]  Flaxseed, Linseed, (FLAXSEED OIL) 1000 MG CAPS Take 1 capsule by mouth daily.    [provider]  ibuprofen (ADVIL,MOTRIN) 800 MG tablet Take 800 mg by mouth every 8 (eight) hours as needed.    [provider]  Multiple Vitamins-Minerals (CENTRUM ADULTS PO) Take 1 tablet by mouth daily.     [provider]  Probiotic CAPS Take 1 capsule by mouth daily.    [provider]  Wheat Dextrin (BENEFIBER DRINK MIX) PACK Take by mouth daily.    [provider]  zolpidem (AMBIEN) 10 MG tablet Take 10 mg by mouth at bedtime as needed  for sleep.    [provider]    Family History Family History  Problem Relation Age of Onset  . Hypertension Mother   . Leukemia Father     Social History Social History  Substance Use Topics  . Smoking status: Former Smoker    Packs/day: 1.00    Years: 9.00    Types: Cigarettes    Quit date: 10/15/1980  . Smokeless tobacco: Former Systems developer    Types: Chew    Quit date: 10/16/2007  . Alcohol use Yes     Comment: socially      Allergies   Percocet [oxycodone-acetaminophen]   Review of Systems Review of Systems  Cardiovascular: Positive for chest pain.  All other systems reviewed and are negative.    Physical Exam Updated Vital Signs BP (!) 137/94 (BP Location: Left Arm)   Pulse 81   Temp 97.8 F (36.6 C) (Oral)   Resp 16   Ht 6\' 2"  (1.88 m)   Wt 122.5 kg (270 lb)   SpO2 96%   BMI 34.67 kg/m   Physical Exam    Nursing note and vitals reviewed.  66 year old male, resting comfortably and in no acute distress. Vital signs are significant for hypertension. Oxygen saturation is 96%, which is normal. Head is normocephalic and atraumatic. PERRLA, EOMI. Oropharynx is clear. Neck is nontender and supple without adenopathy or JVD. Back is nontender and there is no CVA tenderness. Lungs are clear without rales, wheezes, or rhonchi. Chest is nontender. Heart has regular rate and rhythm without murmur. Abdomen is soft, flat, nontender without masses or hepatosplenomegaly and peristalsis is normoactive. Extremities have trace edema, full range of motion is present. Moderate venous stasis changes are present. Skin is warm and dry without rash. Neurologic: Mental status is normal, cranial nerves are intact, there are no motor or sensory deficits.  ED Treatments / Results  Labs (all labs ordered are listed, but only abnormal results are displayed) Labs Reviewed  BASIC METABOLIC PANEL - Abnormal; Notable for the following:       Result Value   Chloride 112 (*)    Glucose, Bld 106 (*)    All other components within normal limits  CBC - Abnormal; Notable for the following:    MCH 34.2 (*)    MCHC 36.4 (*)    All other components within normal limits  D-DIMER, QUANTITATIVE (NOT AT [facility])  DIFFERENTIAL  I-STAT TROPONIN, ED  POCT I-STAT TROPONIN I    EKG  EKG Interpretation  Date/Time:  Monday May 27 2017 23:16:03 EDT Ventricular Rate:  84 PR Interval:    QRS Duration: 93 QT Interval:  352 QTC Calculation: 416 R Axis:   54 Text Interpretation:  Sinus rhythm Low voltage, precordial leads ST elevation, consider inferior injury Baseline wander in lead(s) II III aVF V2 No old tracing to compare Confirmed by Delora Fuel ([id number]) on 05/27/2017 11:33:04 PM       Radiology Dg Chest 2 View  Result Date: 05/28/2017 CLINICAL DATA:  Progressive chest pain for two days. EXAM: CHEST  2 VIEW COMPARISON:   None. FINDINGS: The cardiomediastinal contours are normal. Subsegmental atelectasis or scarring in the lingula. Pulmonary vasculature is normal. No consolidation, pleural effusion, or pneumothorax. No acute osseous abnormalities are seen. Surgical screw in the left glenoid. Widening of the right acromioclavicular joint. IMPRESSION: No acute pulmonary process. Electronically Signed   By: Jeb Levering M.D.   On: 05/28/2017 00:08    Procedures Procedures (including critical  care time)  Medications Ordered in ED Medications  nitroGLYCERIN (NITROSTAT) SL tablet 0.4 mg (0.4 mg Sublingual Given 05/27/17 2359)  aspirin chewable tablet 243 mg (243 mg Oral Given 05/27/17 2359)     Initial Impression / Assessment and Plan / ED Course  I have reviewed the triage vital signs and the nursing notes.  Pertinent labs & imaging results that were available during my care of the patient were reviewed by me and considered in my medical decision making (see chart for details).  Chest discomfort worrisome for cardiac etiology. ECG does have minimal ST elevation in inferior leads, but not sufficient to activate code STEMI. Old records are reviewed, and he has no relevant past visits. He will be given additional aspirin and will be given a trial of nitroglycerin. Heart Score = 4, which puts him at moderate risk of major adverse cardiac events and an extra 30 days.  He had complete relief of pressure with one nitroglycerin. Troponin is normal and d-dimer is normal. Case is discussed with Dr. Roel Cluck of [facility] who agrees to admit the patient, but she does request ECG we repeated. This has been ordered.  Final Clinical Impressions(s) / ED Diagnoses   Final diagnoses:  Chest pain, unspecified type    New Prescriptions New Prescriptions   No medications on file     Delora Fuel, MD 16/10/96 [phone]

## 2017-05-27 NOTE — ED Triage Notes (Signed)
Pt reports having chest pain for the last 2 days along with pain in left arm. Pt states pain feels like a pressure on chest.

## 2017-05-28 ENCOUNTER — Other Ambulatory Visit: Payer: Self-pay

## 2017-05-28 ENCOUNTER — Observation Stay (HOSPITAL_BASED_OUTPATIENT_CLINIC_OR_DEPARTMENT_OTHER): Payer: Medicare Other

## 2017-05-28 ENCOUNTER — Encounter (HOSPITAL_COMMUNITY): Payer: Self-pay | Admitting: Internal Medicine

## 2017-05-28 DIAGNOSIS — R072 Precordial pain: Secondary | ICD-10-CM | POA: Diagnosis not present

## 2017-05-28 DIAGNOSIS — R079 Chest pain, unspecified: Secondary | ICD-10-CM | POA: Diagnosis not present

## 2017-05-28 DIAGNOSIS — Z96651 Presence of right artificial knee joint: Secondary | ICD-10-CM | POA: Diagnosis not present

## 2017-05-28 DIAGNOSIS — E7800 Pure hypercholesterolemia, unspecified: Secondary | ICD-10-CM | POA: Diagnosis not present

## 2017-05-28 DIAGNOSIS — R9431 Abnormal electrocardiogram [ECG] [EKG]: Secondary | ICD-10-CM | POA: Diagnosis present

## 2017-05-28 DIAGNOSIS — K219 Gastro-esophageal reflux disease without esophagitis: Secondary | ICD-10-CM | POA: Diagnosis not present

## 2017-05-28 DIAGNOSIS — E785 Hyperlipidemia, unspecified: Secondary | ICD-10-CM | POA: Diagnosis present

## 2017-05-28 DIAGNOSIS — Z85810 Personal history of malignant neoplasm of tongue: Secondary | ICD-10-CM | POA: Diagnosis not present

## 2017-05-28 DIAGNOSIS — Z79899 Other long term (current) drug therapy: Secondary | ICD-10-CM | POA: Diagnosis not present

## 2017-05-28 LAB — NM MYOCAR MULTI W/SPECT W/WALL MOTION / EF
CHL CUP MPHR: 155 {beats}/min
CHL CUP RESTING HR STRESS: 85 {beats}/min
CSEPED: 8 min
CSEPEW: 8.5 METS
CSEPPHR: 144 {beats}/min
Exercise duration (sec): 25 s
LV dias vol: 117 mL (ref 62–150)
LV sys vol: 54 mL
Percent HR: 92 %
TID: 0.97

## 2017-05-28 LAB — CBC
HEMATOCRIT: 41.2 % (ref 39.0–52.0)
HEMATOCRIT: 43.1 % (ref 39.0–52.0)
HEMOGLOBIN: 15 g/dL (ref 13.0–17.0)
HEMOGLOBIN: 14.7 g/dL (ref 13.0–17.0)
MCH: 34.2 pg — AB (ref 26.0–34.0)
MCH: 32.9 pg (ref 26.0–34.0)
MCHC: 36.4 g/dL — AB (ref 30.0–36.0)
MCHC: 34.1 g/dL (ref 30.0–36.0)
MCV: 93.8 fL (ref 78.0–100.0)
MCV: 96.4 fL (ref 78.0–100.0)
Platelets: 169 10*3/uL (ref 150–400)
Platelets: 180 10*3/uL (ref 150–400)
RBC: 4.39 MIL/uL (ref 4.22–5.81)
RBC: 4.47 MIL/uL (ref 4.22–5.81)
RDW: 12.5 % (ref 11.5–15.5)
RDW: 12.8 % (ref 11.5–15.5)
WBC: 6 10*3/uL (ref 4.0–10.5)
WBC: 6.5 10*3/uL (ref 4.0–10.5)

## 2017-05-28 LAB — ECHOCARDIOGRAM COMPLETE
AVLVOTPG: 6 mmHg
Ao-asc: 32 cm
CHL CUP MV DEC (S): 197
E/e' ratio: 7.31
EWDT: 197 ms
FS: 27 % — AB (ref 28–44)
Height: 74 in
LA vol: 58.5 mL
LAVOLA4C: 54.9 mL
LDCA: 4.15 cm2
LV TDI E'LATERAL: 13.4
LV TDI E'MEDIAL: 8.59
LVEEAVG: 7.31
LVEEMED: 7.31
LVELAT: 13.4 cm/s
LVOT SV: 90 mL
LVOT VTI: 21.7 cm
LVOT peak vel: 123 cm/s
LVOTD: 23 mm
Lateral S' vel: 14.5 cm/s
MVPG: 4 mmHg
MVPKAVEL: 81.5 m/s
MVPKEVEL: 98 m/s
TAPSE: 24.4 mm
Weight: 4360 oz

## 2017-05-28 LAB — DIFFERENTIAL
Basophils Absolute: 0 10*3/uL (ref 0.0–0.1)
Basophils Relative: 1 %
EOS PCT: 5 %
Eosinophils Absolute: 0.3 10*3/uL (ref 0.0–0.7)
LYMPHS ABS: 1.8 10*3/uL (ref 0.7–4.0)
LYMPHS PCT: 29 %
Monocytes Absolute: 0.7 10*3/uL (ref 0.1–1.0)
Monocytes Relative: 11 %
NEUTROS ABS: 3.3 10*3/uL (ref 1.7–7.7)
NEUTROS PCT: 54 %

## 2017-05-28 LAB — TSH: TSH: 2.78 u[IU]/mL (ref 0.350–4.500)

## 2017-05-28 LAB — POCT I-STAT TROPONIN I: TROPONIN I, POC: 0 ng/mL (ref 0.00–0.08)

## 2017-05-28 LAB — BASIC METABOLIC PANEL
ANION GAP: 6 (ref 5–15)
Anion gap: 7 (ref 5–15)
BUN: 16 mg/dL (ref 6–20)
BUN: 13 mg/dL (ref 6–20)
CHLORIDE: 109 mmol/L (ref 101–111)
CO2: 27 mmol/L (ref 22–32)
CO2: 25 mmol/L (ref 22–32)
Calcium: 9.7 mg/dL (ref 8.9–10.3)
Calcium: 9.4 mg/dL (ref 8.9–10.3)
Chloride: 112 mmol/L — ABNORMAL HIGH (ref 101–111)
Creatinine, Ser: 0.94 mg/dL (ref 0.61–1.24)
Creatinine, Ser: 0.94 mg/dL (ref 0.61–1.24)
GFR calc Af Amer: >60 mL/min (ref >60)
GFR calc non Af Amer: >60 mL/min (ref >60)
GLUCOSE: 106 mg/dL — AB (ref 65–99)
Glucose, Bld: 106 mg/dL — ABNORMAL HIGH (ref 65–99)
POTASSIUM: 4.4 mmol/L (ref 3.5–5.1)
POTASSIUM: 3.9 mmol/L (ref 3.5–5.1)
SODIUM: 141 mmol/L (ref 135–145)
Sodium: 145 mmol/L (ref 135–145)

## 2017-05-28 LAB — HEPARIN LEVEL (UNFRACTIONATED)
Heparin Unfractionated: 0.49 IU/mL (ref 0.30–0.70)
Heparin Unfractionated: 0.44 IU/mL (ref 0.30–0.70)

## 2017-05-28 LAB — LIPID PANEL
CHOLESTEROL: 127 mg/dL (ref 0–200)
HDL: 48 mg/dL (ref >40)
LDL Cholesterol: 71 mg/dL (ref 0–99)
Total CHOL/HDL Ratio: 2.6 RATIO
Triglycerides: 38 mg/dL (ref <150)
VLDL: 8 mg/dL (ref 0–40)

## 2017-05-28 LAB — APTT: APTT: 158 s — AB (ref 24–36)

## 2017-05-28 LAB — TROPONIN I

## 2017-05-28 LAB — PROTIME-INR
INR: 1.2
PROTHROMBIN TIME: 15.2 s (ref 11.4–15.2)

## 2017-05-28 LAB — HIV ANTIBODY (ROUTINE TESTING W REFLEX): HIV Screen 4th Generation wRfx: NONREACTIVE

## 2017-05-28 LAB — D-DIMER, QUANTITATIVE (NOT AT ARMC): D DIMER QUANT: 0.3 ug{FEU}/mL (ref 0.00–0.50)

## 2017-05-28 MED ORDER — SODIUM CHLORIDE 0.9% FLUSH: 3.0000 mL | INTRAVENOUS | Status: DC | PRN

## 2017-05-28 MED ORDER — ALPRAZOLAM 0.25 MG PO TABS: 0.2500 mg | ORAL_TABLET | Freq: Two times a day (BID) | ORAL | Status: DC | PRN

## 2017-05-28 MED ORDER — HEPARIN (PORCINE) IN NACL 100-0.45 UNIT/ML-% IJ SOLN
1400.0000 [IU]/h | INTRAMUSCULAR | Status: DC
  Administered 2017-05-28: 1400 [IU]/h via INTRAVENOUS
  Filled 2017-05-28: qty 250

## 2017-05-28 MED ORDER — HEPARIN BOLUS VIA INFUSION
4000.0000 [IU] | Freq: Once | INTRAVENOUS | Status: AC
  Administered 2017-05-28: 4000 [IU] via INTRAVENOUS
  Filled 2017-05-28: qty 4000

## 2017-05-28 MED ORDER — ACETAMINOPHEN 325 MG PO TABS: 650.0000 mg | ORAL_TABLET | ORAL | Status: DC | PRN

## 2017-05-28 MED ORDER — NITROGLYCERIN 0.4 MG SL SUBL: 0.4000 mg | SUBLINGUAL_TABLET | SUBLINGUAL | Status: DC | PRN

## 2017-05-28 MED ORDER — ASPIRIN EC 81 MG PO TBEC: 81.0000 mg | DELAYED_RELEASE_TABLET | Freq: Every day | ORAL | Status: DC

## 2017-05-28 MED ORDER — ASPIRIN 81 MG PO TBEC: 81.0000 mg | DELAYED_RELEASE_TABLET | Freq: Every day | ORAL | 0 refills | Status: DC

## 2017-05-28 MED ORDER — TECHNETIUM TC 99M TETROFOSMIN IV KIT
30.0000 | PACK | Freq: Once | INTRAVENOUS | Status: AC | PRN
  Administered 2017-05-28: 30 via INTRAVENOUS

## 2017-05-28 MED ORDER — TECHNETIUM TC 99M TETROFOSMIN IV KIT
10.0000 | PACK | Freq: Once | INTRAVENOUS | Status: AC | PRN
  Administered 2017-05-28: 10 via INTRAVENOUS

## 2017-05-28 MED ORDER — SODIUM CHLORIDE 0.9% FLUSH
3.0000 mL | Freq: Two times a day (BID) | INTRAVENOUS | Status: DC
  Administered 2017-05-28: 3 mL via INTRAVENOUS

## 2017-05-28 MED ORDER — PANTOPRAZOLE SODIUM 40 MG PO TBEC: 40.0000 mg | DELAYED_RELEASE_TABLET | Freq: Every day | ORAL | 0 refills | Status: DC

## 2017-05-28 MED ORDER — ASPIRIN 81 MG PO CHEW: 324.0000 mg | CHEWABLE_TABLET | ORAL | Status: DC

## 2017-05-28 MED ORDER — SODIUM CHLORIDE 0.9 % IV SOLN
250.0000 mL | INTRAVENOUS | Status: DC | PRN
  Administered 2017-05-28: 250 mL via INTRAVENOUS

## 2017-05-28 MED ORDER — PANTOPRAZOLE SODIUM 40 MG PO TBEC: 40.0000 mg | DELAYED_RELEASE_TABLET | Freq: Every day | ORAL | Status: DC

## 2017-05-28 MED ORDER — ATORVASTATIN CALCIUM 10 MG PO TABS
10.0000 mg | ORAL_TABLET | Freq: Every day | ORAL | Status: DC
  Administered 2017-05-28: 10 mg via ORAL
  Filled 2017-05-28: qty 1

## 2017-05-28 MED ORDER — ZOLPIDEM TARTRATE 5 MG PO TABS
5.0000 mg | ORAL_TABLET | Freq: Every evening | ORAL | Status: DC | PRN
Start: 2017-05-28 — End: 2017-05-28

## 2017-05-28 MED ORDER — ONDANSETRON HCL 4 MG/2ML IJ SOLN: 4.0000 mg | Freq: Four times a day (QID) | INTRAMUSCULAR | Status: DC | PRN

## 2017-05-28 MED ORDER — ASPIRIN 300 MG RE SUPP: 300.0000 mg | RECTAL | Status: DC

## 2017-05-28 NOTE — Progress Notes (Signed)
[place] for heparin Indication: chest pain/ACS  Allergies  Allergen Reactions  . Percocet [Oxycodone-Acetaminophen] Rash    Patient Measurements: Height: 6\' 2"  (188 cm) Weight: 272 lb 8 oz (123.6 kg) IBW/kg (Calculated) : 82.2 Heparin Dosing Weight: 110kg  Vital Signs: Temp: 98.1 F (36.7 C) (08/14 1320) Temp Source: Oral (08/14 1320) BP: 171/85 (08/14 1519) Pulse Rate: 64 (08/14 1320)  Labs:  Recent Labs  05/27/17 2320 05/28/17 0108 05/28/17 0417 05/28/17 1042 05/28/17 1608  HGB 15.0  --  14.7  --   --   HCT 41.2  --  43.1  --   --   PLT 169  --  180  --   --   APTT  --   --  158*  --   --   LABPROT  --   --  15.2  --   --   INR  --   --  1.20  --   --   HEPARINUNFRC  --   --   --  0.49 0.44  CREATININE 0.94  --  0.94  --   --   TROPONINI  --  <0.03  --   --   --     Estimated Creatinine Clearance: 109.5 mL/min (by C-G formula based on SCr of 0.94 mg/dL).   Medical History: Past Medical History:  Diagnosis Date  . Cancer (HCC)    tongue  . Fracture of left clavicle    2000 / also had infection had to have IV infusion therapy   . GERD (gastroesophageal reflux disease)   . History of chicken pox   . History of kidney stones   . Recurrent falls   . Staph skin infection   . Tinnitus   . Varicose veins     Medications:  Prescriptions Prior to Admission  Medication Sig Dispense Refill Last Dose  . atorvastatin (LIPITOR) 10 MG tablet Take 10 mg by mouth daily.   05/27/2017 at Unknown time  . Flaxseed, Linseed, (FLAXSEED OIL) 1000 MG CAPS Take 1,000 mg by mouth daily.    Past Month at Unknown time  . ibuprofen (ADVIL,MOTRIN) 800 MG tablet Take 800 mg by mouth every 8 (eight) hours as needed for headache, mild pain or moderate pain.    Past Month at Unknown time  . Multiple Vitamins-Minerals (CENTRUM ADULTS PO) Take 1 tablet by mouth daily.    05/27/2017 at Unknown time  . SUMAtriptan (IMITREX) 25 MG tablet Take 25 mg  by mouth every 2 (two) hours as needed for migraine or headache. May repeat in 2 hours if headache persists or recurs.   3 months  . Wheat Dextrin (BENEFIBER DRINK MIX) PACK Take 1 scoop by mouth daily.    05/27/2017 at Unknown time  . zolpidem (AMBIEN) 10 MG tablet Take 10 mg by mouth at bedtime as needed for sleep.   05/26/2017   Scheduled:  . aspirin  324 mg Oral NOW   Or  . aspirin  300 mg Rectal NOW  . [START ON 05/29/2017] aspirin EC  81 mg Oral Daily  . atorvastatin  10 mg Oral Daily  . sodium chloride flush  3 mL Intravenous Q12H   Infusions:  . sodium chloride 250 mL (05/28/17 0346)  . heparin 1,400 Units/hr (05/28/17 0346)    Assessment: 66yo male c/o recurrent CP on heparin. Plans noted for stress test vs heart cath. -heparin level is at goal  Goal of Therapy:  Heparin level 0.3-0.7  units/ml Monitor platelets by anticoagulation protocol: Yes   Plan:  -No heparin changes needed -Will recheck a heparin level later today -Daily heparin level and CBC  Thank you Anette Guarneri, PharmD [phone]  05/28/2017 4:50 PM   e

## 2017-05-28 NOTE — ED Notes (Signed)
Report called to [facility]. rn 3 [address] 32[address]  Report called to care link  Paper work complete for care link  Pt signed transfer doc  ---------------------------------------

## 2017-05-28 NOTE — Progress Notes (Signed)
  Echocardiogram 2D Echocardiogram has been performed.  Johny Chess 05/28/2017, 4:58 PM

## 2017-05-28 NOTE — Care Management Obs Status (Signed)
[place] NOTIFICATION   Patient Details  Name: KMARION RAWL MRN: [id number] Date of Birth: 04-Nov-1950   Medicare Observation Status Notification Given:  Yes    Bethena Roys, RN 05/28/2017, 5:08 PM

## 2017-05-28 NOTE — Consult Note (Signed)
Cardiology Consultation:   Patient ID: Allen Robertson; [id number]; 11/07/1950   Admit date: 05/27/2017 Date of Consult: 05/28/2017  Primary Care Provider: Olin Hauser, MD Primary Cardiologist: new - Dr. Percival Robertson Primary Electrophysiologist:     Patient Profile:   Allen Robertson is a 66 y.o. male with a hx of HLD, tongue cancer, and GERD who is being seen today for the evaluation of chest pain at the request of Dr. Eliseo Robertson.  History of Present Illness:   Allen Robertson is new to this service and presented to [facility] with new onset chest pain. He states he started having chest pain 2 days ago. The pain feels like a pressure and radiated to his left arm. The pain has been intermittent with no aggravating or relieving factors and was rated as a 5/10. The pain was associated with nausea and diaphoresis and occurred at rest. He also reports an approximately 2 week history of abdominal pain, diarrhea, and nausea. He visited his PCP who has taken stool samples and told the patient "everything was normal." He states he has not felt well since the first week of August and felt worse yesterday.  He recently returned from a golf trip in which he mostly road in a golf cart, but did walk up large hills without chest pain. The wife at bedside states when he returned from the trip (1.5 hr car ride) that he has swelling in his lower extremities. The family also has a new pool at their house - he has been swimming for exercise and has not experienced chest pain with activity.   On arrival in the ED, he received ASA and nitro, which relieved his pain. D-dimer in ED was negative.    Past Medical History:  Diagnosis Date  . Cancer (HCC)    tongue  . Fracture of left clavicle    2000 / also had infection had to have IV infusion therapy   . GERD (gastroesophageal reflux disease)   . History of chicken pox   . History of kidney stones   . Recurrent falls   . Staph skin infection   . Tinnitus   . Varicose  veins     Past Surgical History:  Procedure Laterality Date  . BRISTOW PROCEDURE     left shoulder   . I&D EXTREMITY     right lower leg   . JOINT REPLACEMENT    . LEG SURGERY Right   . LITHOTRIPSY    . PICC LINE PLACE PERIPHERAL ([place] [facility])     history of / secondary to right lower leg infection   . SHOULDER ARTHROSCOPY Left   . TONGUE SURGERY     robotic assisted with bilateral neck dissection 11/2011  . TOTAL KNEE ARTHROPLASTY Right    partial  . TOTAL KNEE REVISION Right 09/12/2015   Procedure: RIGHT TOTAL KNEE REVISION;  Surgeon: Paralee Cancel, MD;  Location: [facility];  Service: Orthopedics;  Laterality: Right;  . venous ablation right thigh      2009     Inpatient Medications: Scheduled Meds: . aspirin  324 mg Oral NOW   Or  . aspirin  300 mg Rectal NOW  . [START ON 05/29/2017] aspirin EC  81 mg Oral Daily  . atorvastatin  10 mg Oral Daily  . sodium chloride flush  3 mL Intravenous Q12H   Continuous Infusions: . sodium chloride 250 mL (05/28/17 0346)  . heparin 1,400 Units/hr (05/28/17 0346)   PRN Meds: sodium chloride, acetaminophen, ALPRAZolam,  nitroGLYCERIN, ondansetron (ZOFRAN) IV, sodium chloride flush, zolpidem  Allergies:    Allergies  Allergen Reactions  . Percocet [Oxycodone-Acetaminophen] Rash    Social History:   Social History   Social History  . Marital status: Married    Spouse name: N/A  . Number of children: 2  . Years of education: N/A   Occupational History  . Retired    Social History Main Topics  . Smoking status: Former Smoker    Packs/day: 1.00    Years: 9.00    Types: Cigarettes    Quit date: 10/15/1980  . Smokeless tobacco: Former Systems developer    Types: Chew    Quit date: 10/16/2007  . Alcohol use Yes     Comment: socially   . Drug use: No  . Sexual activity: Not on file   Other Topics Concern  . Not on file   Social History Narrative  . No narrative on file    Family History:    Family History  Problem Relation Age of Onset   . Hypertension Mother   . Leukemia Father      ROS:  Please see the history of present illness.  ROS  All other ROS reviewed and negative.     Physical Exam/Data:   Vitals:   05/28/17 0045 05/28/17 0100 05/28/17 0115 05/28/17 0330  BP: 134/85 137/89 (!) 134/91 (!) 151/92  Pulse: 68 69 64 68  Resp: 16 14 17 18   Temp:    98 F (36.7 C)  TempSrc:    Oral  SpO2: 97% 99% 97% 96%  Weight:    272 lb 8 oz (123.6 kg)  Height:    6\' 2"  (1.88 m)    Intake/Output Summary (Last 24 hours) at 05/28/17 0841 Last data filed at 05/28/17 0643  Gross per 24 hour  Intake           102.13 ml  Output                0 ml  Net           102.13 ml   Filed Weights   05/27/17 2318 05/28/17 0330  Weight: 270 lb (122.5 kg) 272 lb 8 oz (123.6 kg)   Body mass index is 34.99 kg/m.  General:  Well nourished, well developed, in no acute distress HEENT: normal Lymph: no adenopathy Neck: no JVD Endocrine:  No thryomegaly Vascular: No carotid bruits; FA pulses 2+ bilaterally without bruits  Cardiac:  normal S1, S2; RRR; no murmur  Lungs:  clear to auscultation bilaterally, no wheezing, rhonchi or rales  Abd: soft, nontender, no hepatomegaly  Ext: trace edema Musculoskeletal:  No deformities, BUE and BLE strength normal and equal Skin: warm and dry  Neuro:  CNs 2-12 intact, no focal abnormalities noted Psych:  Normal affect   EKG:  The EKG was personally reviewed and demonstrates:  Sinus rhythm, ST elevation in inferior leads Telemetry:  Telemetry was personally reviewed and demonstrates:  Sinus rhythm  Relevant CV Studies:  Echocardiogram pending  Laboratory Data:  Chemistry Recent Labs Lab 05/27/17 2320 05/28/17 0417  NA 145 141  K 4.4 3.9  CL 112* 109  CO2 27 25  GLUCOSE 106* 106*  BUN 16 13  CREATININE 0.94 0.94  CALCIUM 9.7 9.4  GFRNONAA >60 >60  GFRAA >60 >60  ANIONGAP 6 7    No results for input(s): PROT, ALBUMIN, AST, ALT, ALKPHOS, BILITOT in the last 168  hours. Hematology Recent Labs Lab 05/27/17 2320  05/28/17 0417  WBC 6.0 6.5  RBC 4.39 4.47  HGB 15.0 14.7  HCT 41.2 43.1  MCV 93.8 96.4  MCH 34.2* 32.9  MCHC 36.4* 34.1  RDW 12.5 12.8  PLT 169 180   Cardiac Enzymes Recent Labs Lab 05/28/17 0108  TROPONINI <0.03    Recent Labs Lab 05/27/17 2359  TROPIPOC 0.00    BNPNo results for input(s): BNP, PROBNP in the last 168 hours.  DDimer  Recent Labs Lab 05/27/17 2348  DDIMER 0.30    Radiology/Studies:  Dg Chest 2 View  Result Date: 05/28/2017 CLINICAL DATA:  Progressive chest pain for two days. EXAM: CHEST  2 VIEW COMPARISON:  None. FINDINGS: The cardiomediastinal contours are normal. Subsegmental atelectasis or scarring in the lingula. Pulmonary vasculature is normal. No consolidation, pleural effusion, or pneumothorax. No acute osseous abnormalities are seen. Surgical screw in the left glenoid. Widening of the right acromioclavicular joint. IMPRESSION: No acute pulmonary process. Electronically Signed   By: Jeb Levering M.D.   On: 05/28/2017 00:08    Assessment and Plan:   1. Chest pain - no history of CAD - troponin x 2 negative - EKG with ST elevation in inferior leads - no older EKGs to compare - D-dimer negative The patient's chest pain occurred at rest and radiated down his right arm. It was associated with lightheadedness and diaphoresis.  He has not had recent chest pain with activity. He also reports a 2-week history of abdominal complaints. He is a former smoker (quit 35 years ago) and has HLD with LDL 71 on 10 mg lipitor. No family history of ACS. Echo pending. Will discuss with attending further ischemic evaluation given his EKG changes in the presence of normal enzymes (stress test vs heart cath).   2. HLD - LDL 71 on 10 mg lipitor - continue statin   3. GERD - continue PPI    Signed, Ledora Bottcher, [place]  05/28/2017 8:41 AM   History and all data above reviewed.  Patient examined.  I  agree with the findings as above.  The patient has chest pain.  This is substernal.  There was tingling in the left arm.  He had no associated symptoms.  This came on at rest and was persistent for many hours.  He has had no acute EKG changes and no enzyme elevation.  He is pain free.  The patient exam reveals COR:RRR  ,  Lungs: clear  ,  Abd: Positive bowel sounds, no rebound no guarding, Ext No edema  .  All available labs, radiology testing, previous records reviewed. Agree with documented assessment and plan. Chest pain:  No objective evidence of ischemia.  Plan Lexiscan Myoview.  If this is normal consider a GI source.   Jeneen Rinks Randi Poullard  10:50 AM  05/28/2017

## 2017-05-28 NOTE — Progress Notes (Signed)
ANTICOAGULATION CONSULT NOTE - Initial Consult  Pharmacy Consult for heparin Indication: chest pain/ACS  Allergies  Allergen Reactions  . Percocet [Oxycodone-Acetaminophen] Rash    Patient Measurements: Height: 6\' 2"  (188 cm) Weight: 270 lb (122.5 kg) IBW/kg (Calculated) : 82.2 Heparin Dosing Weight: 110kg  Vital Signs: Temp: 97.8 F (36.6 C) (08/13 2317) Temp Source: Oral (08/13 2317) BP: 134/91 (08/14 0115) Pulse Rate: 64 (08/14 0115)  Labs:  Recent Labs  05/27/17 2320 05/28/17 0108  HGB 15.0  --   HCT 41.2  --   PLT 169  --   CREATININE 0.94  --   TROPONINI  --  <0.03    Estimated Creatinine Clearance: 108.9 mL/min (by C-G formula based on SCr of 0.94 mg/dL).   Medical History: Past Medical History:  Diagnosis Date  . Cancer (HCC)    tongue  . Fracture of left clavicle    2000 / also had infection had to have IV infusion therapy   . GERD (gastroesophageal reflux disease)   . History of chicken pox   . History of kidney stones   . Recurrent falls   . Staph skin infection   . Tinnitus   . Varicose veins     Medications:  Prescriptions Prior to Admission  Medication Sig Dispense Refill Last Dose  . atorvastatin (LIPITOR) 10 MG tablet Take 10 mg by mouth daily.   05/27/2017 at Unknown time  . Flaxseed, Linseed, (FLAXSEED OIL) 1000 MG CAPS Take 1,000 mg by mouth daily.    Past Month at Unknown time  . ibuprofen (ADVIL,MOTRIN) 800 MG tablet Take 800 mg by mouth every 8 (eight) hours as needed for headache, mild pain or moderate pain.    Past Month at Unknown time  . Multiple Vitamins-Minerals (CENTRUM ADULTS PO) Take 1 tablet by mouth daily.    05/27/2017 at Unknown time  . SUMAtriptan (IMITREX) 25 MG tablet Take 25 mg by mouth every 2 (two) hours as needed for migraine or headache. May repeat in 2 hours if headache persists or recurs.   3 months  . Wheat Dextrin (BENEFIBER DRINK MIX) PACK Take 1 scoop by mouth daily.    05/27/2017 at Unknown time  . zolpidem  (AMBIEN) 10 MG tablet Take 10 mg by mouth at bedtime as needed for sleep.   05/26/2017   Scheduled:  . aspirin  324 mg Oral NOW   Or  . aspirin  300 mg Rectal NOW  . [START ON 05/29/2017] aspirin EC  81 mg Oral Daily  . atorvastatin  10 mg Oral Daily  . sodium chloride flush  3 mL Intravenous Q12H   Infusions:  . sodium chloride      Assessment: 66yo male c/o recurrent CP associated w/ nausea and diaphoresis, initial troponin negative, to begin heparin.  Goal of Therapy:  Heparin level 0.3-0.7 units/ml Monitor platelets by anticoagulation protocol: Yes   Plan:  Will give heparin 4000 units IV bolus x1 followed by gtt at 1400 units/hr and monitor heparin levels and CBC.  Wynona Neat, PharmD, BCPS  05/28/2017,3:06 AM

## 2017-05-28 NOTE — Progress Notes (Signed)
[facility] for heparin Indication: chest pain/ACS  Allergies  Allergen Reactions  . Percocet [Oxycodone-Acetaminophen] Rash    Patient Measurements: Height: 6\' 2"  (188 cm) Weight: 272 lb 8 oz (123.6 kg) IBW/kg (Calculated) : 82.2 Heparin Dosing Weight: 110kg  Vital Signs: Temp: 98 F (36.7 C) (08/14 0330) Temp Source: Oral (08/14 0330) BP: 151/92 (08/14 0330) Pulse Rate: 68 (08/14 0330)  Labs:  Recent Labs  05/27/17 2320 05/28/17 0108 05/28/17 0417 05/28/17 1042  HGB 15.0  --  14.7  --   HCT 41.2  --  43.1  --   PLT 169  --  180  --   APTT  --   --  158*  --   LABPROT  --   --  15.2  --   INR  --   --  1.20  --   HEPARINUNFRC  --   --   --  0.49  CREATININE 0.94  --  0.94  --   TROPONINI  --  <0.03  --   --     Estimated Creatinine Clearance: 109.5 mL/min (by C-G formula based on SCr of 0.94 mg/dL).   Medical History: Past Medical History:  Diagnosis Date  . Cancer (HCC)    tongue  . Fracture of left clavicle    2000 / also had infection had to have IV infusion therapy   . GERD (gastroesophageal reflux disease)   . History of chicken pox   . History of kidney stones   . Recurrent falls   . Staph skin infection   . Tinnitus   . Varicose veins     Medications:  Prescriptions Prior to Admission  Medication Sig Dispense Refill Last Dose  . atorvastatin (LIPITOR) 10 MG tablet Take 10 mg by mouth daily.   05/27/2017 at Unknown time  . Flaxseed, Linseed, (FLAXSEED OIL) 1000 MG CAPS Take 1,000 mg by mouth daily.    Past Month at Unknown time  . ibuprofen (ADVIL,MOTRIN) 800 MG tablet Take 800 mg by mouth every 8 (eight) hours as needed for headache, mild pain or moderate pain.    Past Month at Unknown time  . Multiple Vitamins-Minerals (CENTRUM ADULTS PO) Take 1 tablet by mouth daily.    05/27/2017 at Unknown time  . SUMAtriptan (IMITREX) 25 MG tablet Take 25 mg by mouth every 2 (two) hours as needed for migraine or  headache. May repeat in 2 hours if headache persists or recurs.   3 months  . Wheat Dextrin (BENEFIBER DRINK MIX) PACK Take 1 scoop by mouth daily.    05/27/2017 at Unknown time  . zolpidem (AMBIEN) 10 MG tablet Take 10 mg by mouth at bedtime as needed for sleep.   05/26/2017   Scheduled:  . aspirin  324 mg Oral NOW   Or  . aspirin  300 mg Rectal NOW  . [START ON 05/29/2017] aspirin EC  81 mg Oral Daily  . atorvastatin  10 mg Oral Daily  . sodium chloride flush  3 mL Intravenous Q12H   Infusions:  . sodium chloride 250 mL (05/28/17 0346)  . heparin 1,400 Units/hr (05/28/17 0346)    Assessment: 66yo male c/o recurrent CP on heparin. Plans noted for stress test vs heart cath. -heparin level is at goal  Goal of Therapy:  Heparin level 0.3-0.7 units/ml Monitor platelets by anticoagulation protocol: Yes   Plan:  -No heparin changes needed -Will recheck a heparin level later today -Daily heparin level and CBC  Hildred Laser, Pharm D 05/28/2017 12:39 PM   e

## 2017-05-28 NOTE — H&P (Signed)
[name] HEN:[id number] DOB: [birth year] DOA: 05/27/2017     PCP: Olin Hauser, MD   Outpatient Specialists: Justin Mend   Patient coming from:    home Lives   With family   Chief Complaint: chest pain  HPI: Allen Robertson is a 66 y.o. male with medical history significant of HLD, tongue cancer, GERD,     Presented with 2 day history of chest pain radiating to left arm feels a pressure in his chest. First event was yesterday and was light in nature and has resolved after 2 hour occurred at rest this p.m. patient developed another episode at 9 PM associated some numbness to left arm and  Nausea positive for diaphoresis no vomiting this occurred at rest there is no exertion. It seems that nitroglycerin have made it better. Patient took 81 mg of aspirin home. He have not felt well all day.  2 weeks ago he had a pain in his flank he travelled to the beach both he and his wife had NVD. He felt a bit better but last night his nausea have came back.  He denies fever but had some chills.  He felt over all light headed. Denies shortness of breath.  At home his blood pressure reading have been elevated up to 170.  He states that although he felt very mild pain in ER now it have resolved.  At baseline he has no exertional chest pain.    Regarding pertinent Chronic problems: History of hyperlipidemia on statin. No known history of coronary artery disease no known history of diabetes. Denies ever having a stress test.   IN ER:  Temp (24hrs), Avg:97.8 F (36.6 C), Min:97.8 F (36.6 C), Max:97.8 F (36.6 C)      on arrival  ED Triage Vitals  Enc Vitals Group     BP 05/27/17 2317 (!) 137/94     Pulse Rate 05/27/17 2317 81     Resp 05/27/17 2317 16     Temp 05/27/17 2317 97.8 F (36.6 C)     Temp Source 05/27/17 2317 Oral     SpO2 05/27/17 2317 96 %     Weight 05/27/17 2318 270 lb (122.5 kg)     Height 05/27/17 2318 6\' 2"  (1.88 m)     Head Circumference --      Peak Flow --    Pain Score 05/27/17 2317 5     Pain Loc --      Pain Edu? --      Excl. in [place]? --     Troponin 0.00  d-dimer 0.30  Na 145 K4.4 bicarbonate 27 BUN 16 cr 0.94  WBC 6.0  Hg 15  plt  169 CXT: non-acute   Following Medications were ordered in ER: Medications  nitroGLYCERIN (NITROSTAT) SL tablet 0.4 mg (0.4 mg Sublingual Given 05/27/17 2359)  aspirin chewable tablet 243 mg (243 mg Oral Given 05/27/17 2359)      Hospitalist was called for admission for Chest pain evaluation  review of Systems:    Pertinent positives include: chest pain, nausea  Constitutional:  No weight loss, night sweats, Fevers, chills, fatigue, weight loss  HEENT:  No headaches, Difficulty swallowing,Tooth/dental problems,Sore throat,  No sneezing, itching, ear ache, nasal congestion, post nasal drip,  Cardio-vascular:  No  Orthopnea, PND, anasarca, dizziness, palpitations.no Bilateral lower extremity swelling  GI:  No heartburn, indigestion, abdominal pain, , vomiting, diarrhea, change in bowel habits, loss of appetite, melena, blood in stool, hematemesis Resp:  no shortness of breath at rest. No dyspnea on exertion, No excess mucus, no productive cough, No non-productive cough, No coughing up of blood.No change in color of mucus.No wheezing. Skin:  no rash or lesions. No jaundice GU:  no dysuria, change in color of urine, no urgency or frequency. No straining to urinate.  No flank pain.  Musculoskeletal:  No joint pain or no joint swelling. No decreased range of motion. No back pain.  Psych:  No change in mood or affect. No depression or anxiety. No memory loss.  Neuro: no localizing neurological complaints, no tingling, no weakness, no double vision, no gait abnormality, no slurred speech, no confusion  As per HPI otherwise 10 point review of systems negative.   Past Medical History: Past Medical History:  Diagnosis Date  . Cancer (HCC)    tongue  . Fracture of left clavicle    2000 / also had  infection had to have IV infusion therapy   . GERD (gastroesophageal reflux disease)   . History of chicken pox   . History of kidney stones   . Recurrent falls   . Staph skin infection   . Tinnitus   . Varicose veins    Past Surgical History:  Procedure Laterality Date  . BRISTOW PROCEDURE     left shoulder   . I&D EXTREMITY     right lower leg   . JOINT REPLACEMENT    . LEG SURGERY Right   . LITHOTRIPSY    . PICC LINE PLACE PERIPHERAL ([place] HX)     history of / secondary to right lower leg infection   . SHOULDER ARTHROSCOPY Left   . TONGUE SURGERY     robotic assisted with bilateral neck dissection 11/2011  . TOTAL KNEE ARTHROPLASTY Right    partial  . TOTAL KNEE REVISION Right 09/12/2015   Procedure: RIGHT TOTAL KNEE REVISION;  Surgeon: Paralee Cancel, MD;  Location: [facility];  Service: Orthopedics;  Laterality: Right;  . venous ablation right thigh      2009     Social History:  Ambulatory   Independently     reports that he quit smoking about 36 years ago. His smoking use included Cigarettes. He has a 9.00 pack-year smoking history. He quit smokeless tobacco use about 9 years ago. His smokeless tobacco use included Chew. He reports that he drinks alcohol. He reports that he does not use drugs.  Allergies:   Allergies  Allergen Reactions  . Percocet [Oxycodone-Acetaminophen] Rash       Family History:   Family History  Problem Relation Age of Onset  . Hypertension Mother   . Leukemia Father     Medications: Prior to Admission medications   Medication Sig Start Date End Date Taking? Authorizing Provider  atorvastatin (LIPITOR) 10 MG tablet Take 10 mg by mouth daily.    [provider]  calcium carbonate (OS-CAL) 1250 (500 CA) MG chewable tablet Chew 2 tablets by mouth daily.    [provider]  Flaxseed, Linseed, (FLAXSEED OIL) 1000 MG CAPS Take 1 capsule by mouth daily.    [provider]  ibuprofen (ADVIL,MOTRIN) 800 MG tablet  Take 800 mg by mouth every 8 (eight) hours as needed.    [provider]  Multiple Vitamins-Minerals (CENTRUM ADULTS PO) Take 1 tablet by mouth daily.     [provider]  Probiotic CAPS Take 1 capsule by mouth daily.    [provider]  Wheat Dextrin (BENEFIBER DRINK [place]) PACK  Take by mouth daily.    [provider]  zolpidem (AMBIEN) 10 MG tablet Take 10 mg by mouth at bedtime as needed for sleep.    [provider]    Physical Exam: Patient Vitals for the past 24 hrs:  BP Temp Temp src Pulse Resp SpO2 Height Weight  05/27/17 2318 - - - - - - 6\' 2"  (1.88 m) 122.5 kg (270 lb)  05/27/17 2317 (!) 137/94 97.8 F (36.6 C) Oral 81 16 96 % - -    1. General:  in No Acute distress 2. Psychological: Alert and  Oriented 3. Head/ENT:    Dry Mucous Membranes                          Head Non traumatic, neck supple                          Normal   Dentition 4. SKIN:   decreased Skin turgor,  Skin clean Dry and intact no rash 5. Heart: Regular rate and rhythm no Murmur, Rub or gallop 6. Lungs: Clear to auscultation bilaterally, no wheezes or crackles   7. Abdomen: Soft, non-tender, Non distended 8. Lower extremities: no clubbing, cyanosis, or edema 9. Neurologically Grossly intact, moving all 4 extremities equally  10. MSK: Normal range of motion   body mass index is 34.67 kg/m.  Labs on Admission:   Labs on Admission: I have personally reviewed following labs and imaging studies  CBC:  Recent Labs Lab 05/27/17 2320  WBC 6.0  NEUTROABS 3.3  HGB 15.0  HCT 41.2  MCV 93.8  PLT 950   Basic Metabolic Panel:  Recent Labs Lab 05/27/17 2320  NA 145  K 4.4  CL 112*  CO2 27  GLUCOSE 106*  BUN 16  CREATININE 0.94  CALCIUM 9.7   GFR: Estimated Creatinine Clearance: 108.9 mL/min (by C-G formula based on SCr of 0.94 mg/dL). Liver Function Tests: No results for input(s): AST, ALT, ALKPHOS, BILITOT, PROT, ALBUMIN in the last 168  hours. No results for input(s): LIPASE, AMYLASE in the last 168 hours. No results for input(s): AMMONIA in the last 168 hours. Coagulation Profile: No results for input(s): INR, PROTIME in the last 168 hours. Cardiac Enzymes: No results for input(s): CKTOTAL, CKMB, CKMBINDEX, TROPONINI in the last 168 hours. BNP (last 3 results) No results for input(s): PROBNP in the last 8760 hours. HbA1C: No results for input(s): HGBA1C in the last 72 hours. CBG: No results for input(s): GLUCAP in the last 168 hours. Lipid Profile: No results for input(s): CHOL, HDL, LDLCALC, TRIG, CHOLHDL, LDLDIRECT in the last 72 hours. Thyroid Function Tests: No results for input(s): TSH, T4TOTAL, FREET4, T3FREE, THYROIDAB in the last 72 hours. Anemia Panel: No results for input(s): VITAMINB12, FOLATE, FERRITIN, TIBC, IRON, RETICCTPCT in the last 72 hours. Urine analysis:  Sepsis Labs: @LABRCNTIP (procalcitonin:4,lacticidven:4) )No results found for this or any previous visit (from the past 240 hour(s)).     UA  not ordered  No results found for: HGBA1C  Estimated Creatinine Clearance: 108.9 mL/min (by C-G formula based on SCr of 0.94 mg/dL).  BNP (last 3 results) No results for input(s): PROBNP in the last 8760 hours.   ECG REPORT  Independently reviewed Rate: 84   Rhythm: NSR ST&T Change: Less than 1 mm ST elevation in inferior leads most likely early repolarization QTC 416  Repeat EKG heart rate 65 again minimal  ST elevation inferior leads no progression   Filed Weights   05/27/17 2318  Weight: 122.5 kg (270 lb)     Cultures: No results found for: SDES, SPECREQUEST, CULT, REPTSTATUS   Radiological Exams on Admission: Dg Chest 2 View  Result Date: 05/28/2017 CLINICAL DATA:  Progressive chest pain for two days. EXAM: CHEST  2 VIEW COMPARISON:  None. FINDINGS: The cardiomediastinal contours are normal. Subsegmental atelectasis or scarring in the lingula. Pulmonary vasculature is normal. No  consolidation, pleural effusion, or pneumothorax. No acute osseous abnormalities are seen. Surgical screw in the left glenoid. Widening of the right acromioclavicular joint. IMPRESSION: No acute pulmonary process. Electronically Signed   By: Jeb Levering M.D.   On: 05/28/2017 00:08    Chart has been reviewed    Assessment/Plan  66 y.o. male with medical history significant of HLD, tongue cancer, GERD,     Admitted for Chest pain evaluation.   Present on Admission: . Chest pain- Given risk factors including age and hyperlipidemia for a history of smoking none worrisome story we will admit to telemetry to [facility] discussed with cardiology. Initiate heparin for possible unstable angina. Order echo gram continued cycle cardiac enzymes showed good lipid panel and TSH . Hyperlipidemia continue statin check lipid panel . GERD (gastroesophageal reflux disease) currently stable by symptomatic Abnormal EKG discussed with cardiology most likely early repolarization changes  Other plan as per orders.  DVT prophylaxis:  Heparin  Code Status:  FULL CODE as per patient    Family Communication:   Family   at  Bedside  plan of care was discussed with   Wife  Disposition Plan:      To home once workup is complete and patient is stable                           Consults called: email cardiology  Discussed with Cardiology at night  Admission status:    obs   Level of care     tele       I have spent a total of 61 min on this admission   extra time was spent to discuss case with consultants  Murphy 05/28/2017, 1:24 AM    [facility]  Pager [phone]   after 2 AM please page floor coverage PA If 7AM-7PM, please contact the day team taking care of the patient  [email or web address]  Password [email or web address]

## 2017-05-28 NOTE — Discharge Summary (Signed)
Physician Discharge Summary  Allen Robertson BJS:[id number] DOB: May 16, 1951 DOA: 05/27/2017  PCP: Olin Hauser, MD  Admit date: 05/27/2017 Discharge date: 05/28/2017   Recommendations for Outpatient Follow-Up:   1. Echo results pending 2. Outpatient GI evaluation   Discharge Diagnosis:   Active Problems:   Chest pain   Hyperlipidemia   GERD (gastroesophageal reflux disease)   Abnormal EKG   Discharge disposition:  Home.  Discharge Condition: Improved.  Diet recommendation: Low sodium, heart healthy  Wound care: None.   History of Present Illness:   Allen Robertson is a 66 y.o. male with medical history significant of HLD, tongue cancer, GERD,     Presented with 2 day history of chest pain radiating to left arm feels a pressure in his chest. First event was yesterday and was light in nature and has resolved after 2 hour occurred at rest this p.m. patient developed another episode at 9 PM associated some numbness to left arm and  Nausea positive for diaphoresis no vomiting this occurred at rest there is no exertion. It seems that nitroglycerin have made it better. Patient took 81 mg of aspirin home. He have not felt well all day.  2 weeks ago he had a pain in his flank he travelled to the beach both he and his wife had NVD. He felt a bit better but last night his nausea have came back.  He denies fever but had some chills.  He felt over all light headed. Denies shortness of breath.  At home his blood pressure reading have been elevated up to 170.  He states that although he felt very mild pain in ER now it have resolved.  At baseline he has no exertional chest pain.    Hospital Course by Problem:  Chest pain -CE negative -low risk ST Likely GI source- protonix added, may need GI referral  HTN -monitor as an outpatient and adjust meds  HLD statin    Medical Consultants:    cards   Discharge Exam:   Vitals:   05/28/17 1519 05/28/17 1721  BP: (!)  171/85 (!) 146/87  Pulse:    Resp:    Temp:    SpO2:     Vitals:   05/28/17 1515 05/28/17 1517 05/28/17 1519 05/28/17 1721  BP: (!) 194/93 (!) 181/85 (!) 171/85 (!) 146/87  Pulse:      Resp:      Temp:      TempSrc:      SpO2:      Weight:      Height:          The results of significant diagnostics from this hospitalization (including imaging, microbiology, ancillary and laboratory) are listed below for reference.     Procedures and Diagnostic Studies:   Dg Chest 2 View  Result Date: 05/28/2017 CLINICAL DATA:  Progressive chest pain for two days. EXAM: CHEST  2 VIEW COMPARISON:  None. FINDINGS: The cardiomediastinal contours are normal. Subsegmental atelectasis or scarring in the lingula. Pulmonary vasculature is normal. No consolidation, pleural effusion, or pneumothorax. No acute osseous abnormalities are seen. Surgical screw in the left glenoid. Widening of the right acromioclavicular joint. IMPRESSION: No acute pulmonary process. Electronically Signed   By: Jeb Levering M.D.   On: 05/28/2017 00:08   Nm Myocar Multi W/spect W/wall Motion / Ef  Result Date: 05/28/2017  Blood pressure demonstrated a hypertensive response to exercise.  There was no ST segment deviation noted during stress.  No T wave  inversion was noted during stress.  The study is normal.  This is a low risk study.  The left ventricular ejection fraction is mildly decreased (45-54%).      Labs:   Basic Metabolic Panel:  Recent Labs Lab 05/27/17 2320 05/28/17 0417  NA 145 141  K 4.4 3.9  CL 112* 109  CO2 27 25  GLUCOSE 106* 106*  BUN 16 13  CREATININE 0.94 0.94  CALCIUM 9.7 9.4   GFR Estimated Creatinine Clearance: 109.5 mL/min (by C-G formula based on SCr of 0.94 mg/dL). Liver Function Tests: No results for input(s): AST, ALT, ALKPHOS, BILITOT, PROT, ALBUMIN in the last 168 hours. No results for input(s): LIPASE, AMYLASE in the last 168 hours. No results for input(s): AMMONIA in  the last 168 hours. Coagulation profile  Recent Labs Lab 05/28/17 0417  INR 1.20    CBC:  Recent Labs Lab 05/27/17 2320 05/28/17 0417  WBC 6.0 6.5  NEUTROABS 3.3  --   HGB 15.0 14.7  HCT 41.2 43.1  MCV 93.8 96.4  PLT 169 180   Cardiac Enzymes:  Recent Labs Lab 05/28/17 0108  TROPONINI <0.03   BNP: Invalid input(s): POCBNP CBG: No results for input(s): GLUCAP in the last 168 hours. D-Dimer  Recent Labs  05/27/17 2348  DDIMER 0.30   Hgb A1c No results for input(s): HGBA1C in the last 72 hours. Lipid Profile  Recent Labs  05/28/17 0417  CHOL 127  HDL 48  LDLCALC 71  TRIG 38  CHOLHDL 2.6   Thyroid function studies  Recent Labs  05/28/17 0417  TSH 2.780   Anemia work up No results for input(s): VITAMINB12, FOLATE, FERRITIN, TIBC, IRON, RETICCTPCT in the last 72 hours. Microbiology No results found for this or any previous visit (from the past 240 hour(s)).   Discharge Instructions:   Discharge Instructions    Diet - low sodium heart healthy    Complete by:  As directed    Discharge instructions    Complete by:  As directed    Can take protonix or other acid blocker/PPI.  If symptoms re-occur then PCP can refer to GI for full evaluation   Increase activity slowly    Complete by:  As directed      Allergies as of 05/28/2017      Reactions   Percocet [oxycodone-acetaminophen] Rash      Medication List    STOP taking these medications   BENEFIBER DRINK MIX Pack   Flaxseed Oil 1000 MG Caps   ibuprofen 800 MG tablet Commonly known as:  ADVIL,MOTRIN     TAKE these medications   aspirin 81 MG EC tablet Take 1 tablet (81 mg total) by mouth daily.   atorvastatin 10 MG tablet Commonly known as:  LIPITOR Take 10 mg by mouth daily.   CENTRUM ADULTS PO Take 1 tablet by mouth daily.   pantoprazole 40 MG tablet Commonly known as:  PROTONIX Take 1 tablet (40 mg total) by mouth daily.   SUMAtriptan 25 MG tablet Commonly known as:   IMITREX Take 25 mg by mouth every 2 (two) hours as needed for migraine or headache. May repeat in 2 hours if headache persists or recurs.   zolpidem 10 MG tablet Commonly known as:  AMBIEN Take 10 mg by mouth at bedtime as needed for sleep.      Follow-up Information    Olin Hauser, MD Follow up in 1 week(s).   Specialty:  Family Medicine Contact information: [address] [place] [address] [phone]        Minus Breeding, MD Follow up on 06/27/2017.   Specialty:  Cardiology Why:  Cardiology Hospital Follow-Up on 06/27/2017 at 2:00PM.  Contact information: [address] [place] [address] [phone]            Time coordinating discharge: 35 min  Signed:  JESSICA U VANN   [facility] 05/28/2017, 8:02 PM

## 2017-05-28 NOTE — Progress Notes (Signed)
Received patient by carelink via stretcher from [facility] ED. Awake, alert and oriented. Denies chest pain. Reports pain 0 out 10. Page sent to both [facility] admissions and Dr. Jani Gravel to notify  patient's arrival to floor in room 3W 32. Pt  And spouse at the bedside made aware of heparin drip order. Education provided. Currently infusing at 14 ml/ hr following 4000 units bolus given. [place]

## 2017-05-28 NOTE — Progress Notes (Signed)
Patient admitted after midnight, please see H&P.  Getting stress test currently.  Per cards if low risk ST then most likely a GI source and will need outpatient eval  Eulogio Bear DO

## 2017-05-28 NOTE — Progress Notes (Addendum)
   Allen Robertson presented for a 1-day Treadmill cardiolite today.  No immediate complications.  Stress imaging is pending at this time.  Erma Heritage, PA-C 05/28/2017, 3:22 PM   Stress test showed:    Blood pressure demonstrated a hypertensive response to exercise.  There was no ST segment deviation noted during stress.  No T wave inversion was noted during stress.  The study is normal.  This is a low risk study.  The left ventricular ejection fraction is mildly decreased (45-54%).    Study Conclusions - Left ventricle: The cavity size was normal. Systolic function was   normal. The estimated ejection fraction was in the range of 60%   to 65%. Wall motion was normal; there were no regional wall   motion abnormalities. Left ventricular diastolic function   parameters were normal.   Patient and admitting team made aware of the results. Hospital follow-up has been arranged.  Signed, Erma Heritage, PA-C 05/28/2017, 5:11 PM Pager: [phone]

## 2017-05-29 LAB — HEMOGLOBIN A1C
HEMOGLOBIN A1C: 4.9 % (ref 4.8–5.6)
Mean Plasma Glucose: 94 mg/dL

## 2017-06-07 ENCOUNTER — Encounter: Payer: Self-pay | Admitting: Cardiology

## 2017-06-11 ENCOUNTER — Telehealth: Payer: Self-pay | Admitting: Gastroenterology

## 2017-06-11 NOTE — Telephone Encounter (Signed)
Left message on machine to call back  

## 2017-06-11 NOTE — Telephone Encounter (Signed)
It appears he had complaint of c/p with negative workup and normal Echo recently. No reason why we would have to delay Colo unless he just would like to delay it for 2 mos or so. Thanks-JLL

## 2017-06-12 NOTE — Telephone Encounter (Signed)
The pt has been scheduled for endo and colon. Previsit has not changed.  Pt aware

## 2017-06-12 NOTE — Telephone Encounter (Signed)
Yes, this is ok. JLL

## 2017-06-12 NOTE — Telephone Encounter (Signed)
8/13 went to ED for chest pain and cardiac was r/o the pts wife states they were advised to have an EGD with the colon.  Is it ok to add EGD?

## 2017-06-26 NOTE — Progress Notes (Signed)
Cardiology Office Note   Date:  06/27/2017   ID:  ZACKORY PUDLO, DOB [birth year], MRN [id number]  PCP:  Olin Hauser, MD  Cardiologist:   Minus Breeding, MD    Chief Complaint  Patient presents with  . Chest Pain      History of Present Illness: Allen Robertson is a 66 y.o. male who presents for follow up after a hospitalization last month for chest pain.  He ruled out and had a negative Lexiscan Myoview.  EF was suggested to be low but this was found to be normal on follow up echocardiogram.  Since last time I saw her he has done well.  The patient denies any new symptoms such as chest discomfort, neck or arm discomfort. There has been no new shortness of breath, PND or orthopnea. There have been no reported palpitations, presyncope or syncope.  He is going to have a GI work up.       Past Medical History:  Diagnosis Date  . Cancer (HCC)    tongue  . Fracture of left clavicle    2000 / also had infection had to have IV infusion therapy   . GERD (gastroesophageal reflux disease)   . History of chicken pox   . History of kidney stones   . Recurrent falls   . Staph skin infection   . Tinnitus   . Varicose veins     Past Surgical History:  Procedure Laterality Date  . BRISTOW PROCEDURE     left shoulder   . I&D EXTREMITY     right lower leg   . JOINT REPLACEMENT    . LEG SURGERY Right   . LITHOTRIPSY    . PICC LINE PLACE PERIPHERAL ([facility])     history of / secondary to right lower leg infection   . SHOULDER ARTHROSCOPY Left   . TONGUE SURGERY     robotic assisted with bilateral neck dissection 11/2011  . TOTAL KNEE ARTHROPLASTY Right    partial  . TOTAL KNEE REVISION Right 09/12/2015   Procedure: RIGHT TOTAL KNEE REVISION;  Surgeon: Paralee Cancel, MD;  Location: [facility];  Service: Orthopedics;  Laterality: Right;  . venous ablation right thigh      2009     Current Outpatient Prescriptions  Medication Sig Dispense Refill  . aspirin EC 81 MG EC tablet  Take 1 tablet (81 mg total) by mouth daily. 30 tablet 0  . atorvastatin (LIPITOR) 10 MG tablet Take 10 mg by mouth daily.    . Multiple Vitamins-Minerals (CENTRUM ADULTS PO) Take 1 tablet by mouth daily.     . pantoprazole (PROTONIX) 40 MG tablet Take 1 tablet (40 mg total) by mouth daily. 30 tablet 0  . SUMAtriptan (IMITREX) 25 MG tablet Take 25 mg by mouth every 2 (two) hours as needed for migraine or headache. May repeat in 2 hours if headache persists or recurs.    Marland Kitchen zolpidem (AMBIEN) 10 MG tablet Take 10 mg by mouth at bedtime as needed for sleep.     No current facility-administered medications for this visit.     Allergies:   Percocet [oxycodone-acetaminophen]     ROS:  Please see the history of present illness.   Otherwise, review of systems are positive for none.   All other systems are reviewed and negative.    PHYSICAL EXAM: VS:  BP 115/73   Pulse 78   Ht 6\' 2"  (1.88 m)   Wt 270 lb (122.5  kg)   SpO2 95%   BMI 34.67 kg/m  , BMI Body mass index is 34.67 kg/m. GENERAL:  Well appearing HEENT:  Pupils equal round and reactive, fundi not visualized, oral mucosa unremarkable NECK:  No jugular venous distention, waveform within normal limits, carotid upstroke brisk and symmetric, no bruits, no thyromegaly LYMPHATICS:  No cervical, inguinal adenopathy LUNGS:  Clear to auscultation bilaterally BACK:  No CVA tenderness CHEST:  Unremarkable HEART:  PMI not displaced or sustained,S1 and S2 within normal limits, no S3, no S4, no clicks, no rubs, no murmurs ABD:  Flat, positive bowel sounds normal in frequency in pitch, no bruits, no rebound, no guarding, no midline pulsatile mass, no hepatomegaly, no splenomegaly EXT:  2 plus pulses throughout, no edema, no cyanosis no clubbing   EKG:  EKG is not ordered today.    Recent Labs: 01/03/2017: ALT 22 05/28/2017: BUN 13; Creatinine, Ser 0.94; Hemoglobin 14.7; Platelets 180; Potassium 3.9; Sodium 141; TSH 2.780    Lipid Panel      Component Value Date/Time   CHOL 127 05/28/2017 0417   TRIG 38 05/28/2017 0417   HDL 48 05/28/2017 0417   CHOLHDL 2.6 05/28/2017 0417   VLDL 8 05/28/2017 0417   LDLCALC 71 05/28/2017 0417      Wt Readings from Last 3 Encounters:  06/27/17 270 lb (122.5 kg)  05/28/17 272 lb 8 oz (123.6 kg)  01/03/17 279 lb (126.6 kg)      Other studies Reviewed: Additional studies/ records that were reviewed today include: None. Review of the above records demonstrates:  Please see elsewhere in the note.     ASSESSMENT AND PLAN:    CHEST PAIN:  He has had no further chest pain.  No further work up is indicated.    OBESE: The patient understands the need to lose weight with diet and exercise. We have discussed specific strategies for this.   Current medicines are reviewed at length with the patient today.  The patient does not have concerns regarding medicines.  The following changes have been made:  no change  Labs/ tests ordered today include: None No orders of the defined types were placed in this encounter.    Disposition:   FU with me as needed.      Signed, Minus Breeding, MD  06/27/2017 5:27 PM    [facility]

## 2017-06-27 ENCOUNTER — Ambulatory Visit (INDEPENDENT_AMBULATORY_CARE_PROVIDER_SITE_OTHER): Payer: Medicare Other | Admitting: Cardiology

## 2017-06-27 ENCOUNTER — Encounter: Payer: Self-pay | Admitting: Cardiology

## 2017-06-27 VITALS — BP 115/73 | HR 78 | Ht 74.0 in | Wt 270.0 lb

## 2017-06-27 DIAGNOSIS — R072 Precordial pain: Secondary | ICD-10-CM

## 2017-06-27 NOTE — Patient Instructions (Signed)
Medication Instructions:  Continue current medications  Labwork: None Ordered  Testing/Procedures: None Ordered  Follow-Up: Your physician recommends that you schedule a follow-up appointment in: As Needed   Any Other Special Instructions Will Be Listed Below (If Applicable).   If you need a refill on your cardiac medications before your next appointment, please call your pharmacy.   

## 2017-07-15 ENCOUNTER — Ambulatory Visit (AMBULATORY_SURGERY_CENTER): Payer: Self-pay | Admitting: *Deleted

## 2017-07-15 VITALS — Ht 74.0 in | Wt 273.0 lb

## 2017-07-15 DIAGNOSIS — Z1211 Encounter for screening for malignant neoplasm of colon: Secondary | ICD-10-CM

## 2017-07-15 DIAGNOSIS — R079 Chest pain, unspecified: Secondary | ICD-10-CM

## 2017-07-15 MED ORDER — NA SULFATE-K SULFATE-MG SULF 17.5-3.13-1.6 GM/177ML PO SOLN: ORAL | 0 refills | Status: DC

## 2017-07-15 NOTE — Progress Notes (Signed)
Patient denies any allergies to eggs or soy. Patient denies any problems with anesthesia/sedation. Patient denies any oxygen use at home. Patient denies taking any diet/weight loss medications or blood thinners. EMMI education assisgned to patient on EGD & colonoscopy, this was explained and instructions given to patient.

## 2017-07-15 NOTE — Addendum Note (Signed)
Addended by: Levonne Spiller on: 07/15/2017 12:11 PM   Modules accepted: Orders

## 2017-07-25 ENCOUNTER — Encounter: Payer: 59 | Admitting: Gastroenterology

## 2017-08-07 ENCOUNTER — Encounter: Payer: Self-pay | Admitting: Gastroenterology

## 2017-08-07 ENCOUNTER — Ambulatory Visit (AMBULATORY_SURGERY_CENTER): Payer: Medicare Other | Admitting: Gastroenterology

## 2017-08-07 VITALS — BP 119/79 | HR 85 | Temp 96.8°F | Resp 17 | Ht 74.0 in | Wt 270.0 lb

## 2017-08-07 DIAGNOSIS — D124 Benign neoplasm of descending colon: Secondary | ICD-10-CM

## 2017-08-07 DIAGNOSIS — D125 Benign neoplasm of sigmoid colon: Secondary | ICD-10-CM

## 2017-08-07 DIAGNOSIS — R079 Chest pain, unspecified: Secondary | ICD-10-CM | POA: Diagnosis not present

## 2017-08-07 DIAGNOSIS — K635 Polyp of colon: Secondary | ICD-10-CM | POA: Diagnosis not present

## 2017-08-07 DIAGNOSIS — Z1212 Encounter for screening for malignant neoplasm of rectum: Secondary | ICD-10-CM

## 2017-08-07 DIAGNOSIS — K219 Gastro-esophageal reflux disease without esophagitis: Secondary | ICD-10-CM | POA: Diagnosis not present

## 2017-08-07 DIAGNOSIS — Z1211 Encounter for screening for malignant neoplasm of colon: Secondary | ICD-10-CM | POA: Diagnosis not present

## 2017-08-07 MED ORDER — SODIUM CHLORIDE 0.9 % IV SOLN: 500.0000 mL | INTRAVENOUS | Status: DC

## 2017-08-07 NOTE — Op Note (Signed)
[facility] Patient Name: Allen Robertson Procedure Date: 08/07/2017 1:28 PM MRN: [id number] Endoscopist: Ladene Artist , MD Age: 66 Referring MD:  Date of Birth: 1951-01-21 Gender: Male Account #: [id number] Procedure:                Colonoscopy Indications:              Screening for colorectal malignant neoplasm Medicines:                Monitored Anesthesia Care Procedure:                Pre-Anesthesia Assessment:                           - Prior to the procedure, a History and Physical                            was performed, and patient medications and                            allergies were reviewed. The patient's tolerance of                            previous anesthesia was also reviewed. The risks                            and benefits of the procedure and the sedation                            options and risks were discussed with the patient.                            All questions were answered, and informed consent                            was obtained. Prior Anticoagulants: The patient has                            taken no previous anticoagulant or antiplatelet                            agents. ASA Grade Assessment: II - A patient with                            mild systemic disease. After reviewing the risks                            and benefits, the patient was deemed in                            satisfactory condition to undergo the procedure.                           After obtaining informed consent, the colonoscope  was passed under direct vision. Throughout the                            procedure, the patient's blood pressure, pulse, and                            oxygen saturations were monitored continuously. The                            Colonoscope was introduced through the anus and                            advanced to the the cecum, identified by                            appendiceal orifice and  ileocecal valve. The                            ileocecal valve, appendiceal orifice, and rectum                            were photographed. The quality of the bowel                            preparation was good after significant lavage and                            suctioning. The colonoscopy was performed without                            difficulty. The patient tolerated the procedure                            well. Scope In: 1:44:10 PM Scope Out: 1:57:13 PM Scope Withdrawal Time: 0 hours 11 minutes 31 seconds  Total Procedure Duration: 0 hours 13 minutes 3 seconds  Findings:                 The perianal and digital rectal examinations were                            normal.                           Two sessile polyps were found in the sigmoid colon                            and descending colon. The polyps were 6 mm in size.                            These polyps were removed with a cold snare.                            Resection and retrieval were complete.  Internal hemorrhoids were found during                            retroflexion. The hemorrhoids were small and Grade                            I (internal hemorrhoids that do not prolapse).                           The exam was otherwise without abnormality on                            direct and retroflexion views. Complications:            No immediate complications. Estimated blood loss:                            None. Estimated Blood Loss:     Estimated blood loss: none. Impression:               - Two 6 mm polyps in the sigmoid colon and in the                            descending colon, removed with a cold snare.                            Resected and retrieved.                           - Internal hemorrhoids.                           - The examination was otherwise normal on direct                            and retroflexion views. Recommendation:           - Repeat  colonoscopy in 5 years for surveillance if                            polyp(s) are precancerous, otherwise 10 years for                            screening.                           - Patient has a contact number available for                            emergencies. The signs and symptoms of potential                            delayed complications were discussed with the                            patient. Return to normal activities tomorrow.  Written discharge instructions were provided to the                            patient.                           - Resume previous diet.                           - Continue present medications. Ladene Artist, MD 08/07/2017 2:04:07 PM This report has been signed electronically.

## 2017-08-07 NOTE — Progress Notes (Signed)
Called to room to assist during endoscopic procedure.  Patient ID and intended procedure confirmed with present staff. Received instructions for my participation in the procedure from the performing physician.  

## 2017-08-07 NOTE — Op Note (Signed)
[facility] Patient Name: Allen Robertson Procedure Date: 08/07/2017 1:28 PM MRN: [id number] Endoscopist: Ladene Artist , MD Age: 66 Referring MD:  Date of Birth: 09-26-1951 Gender: Male Account #: [id number] Procedure:                Upper GI endoscopy Indications:              Gastro-esophageal reflux disease, Unexplained chest                            pain Medicines:                Monitored Anesthesia Care Procedure:                Pre-Anesthesia Assessment:                           - Prior to the procedure, a History and Physical                            was performed, and patient medications and                            allergies were reviewed. The patient's tolerance of                            previous anesthesia was also reviewed. The risks                            and benefits of the procedure and the sedation                            options and risks were discussed with the patient.                            All questions were answered, and informed consent                            was obtained. Prior Anticoagulants: The patient has                            taken no previous anticoagulant or antiplatelet                            agents. ASA Grade Assessment: II - A patient with                            mild systemic disease. After reviewing the risks                            and benefits, the patient was deemed in                            satisfactory condition to undergo the procedure.  After obtaining informed consent, the endoscope was                            passed under direct vision. Throughout the                            procedure, the patient's blood pressure, pulse, and                            oxygen saturations were monitored continuously. The                            Model GIF-HQ190 [id number]) scope was introduced                            through the mouth, and advanced to the second  part                            of duodenum. The upper GI endoscopy was                            accomplished without difficulty. The patient                            tolerated the procedure well. Scope In: Scope Out: Findings:                 The examined esophagus was normal.                           Multiple dispersed, small non-bleeding erosions                            were found in the gastric body. There were no                            stigmata of recent bleeding. Biopsies were taken                            with a cold forceps for histology.                           The exam of the stomach was otherwise normal.                           Diffuse moderately erythematous mucosa without                            active bleeding and with no stigmata of bleeding                            was found in the duodenal bulb.                           The second portion of the duodenum was  normal. Complications:            No immediate complications. Estimated Blood Loss:     Estimated blood loss was minimal. Impression:               - Normal esophagus.                           - Non-bleeding erosive gastropathy. Biopsied.                           - Duodenal bulb erythema.                           - Normal second portion of the duodenum. Recommendation:           - Patient has a contact number available for                            emergencies. The signs and symptoms of potential                            delayed complications were discussed with the                            patient. Return to normal activities tomorrow.                            Written discharge instructions were provided to the                            patient.                           - Resume previous diet.                           - Continue present medications.                           - Await pathology results. Ladene Artist, MD 08/07/2017 2:15:40 PM This report has been signed  electronically.

## 2017-08-07 NOTE — Patient Instructions (Signed)
YOU HAD AN ENDOSCOPIC PROCEDURE TODAY AT THE [facility]:   Refer to the procedure report that was given to you for any specific questions about what was found during the examination.  If the procedure report does not answer your questions, please call your gastroenterologist to clarify.  If you requested that your care partner not be given the details of your procedure findings, then the procedure report has been included in a sealed envelope for you to review at your convenience later.  YOU SHOULD EXPECT: Some feelings of bloating in the abdomen. Passage of more gas than usual.  Walking can help get rid of the air that was put into your GI tract during the procedure and reduce the bloating. If you had a lower endoscopy (such as a colonoscopy or flexible sigmoidoscopy) you may notice spotting of blood in your stool or on the toilet paper. If you underwent a bowel prep for your procedure, you may not have a normal bowel movement for a few days.  Please Note:  You might notice some irritation and congestion in your nose or some drainage.  This is from the oxygen used during your procedure.  There is no need for concern and it should clear up in a day or so.  SYMPTOMS TO REPORT IMMEDIATELY:   Following lower endoscopy (colonoscopy or flexible sigmoidoscopy):  Excessive amounts of blood in the stool  Significant tenderness or worsening of abdominal pains  Swelling of the abdomen that is new, acute  Fever of 100F or higher   Following upper endoscopy (EGD)  Vomiting of blood or coffee ground material  New chest pain or pain under the shoulder blades  Painful or persistently difficult swallowing  New shortness of breath  Fever of 100F or higher  Black, tarry-looking stools  For urgent or emergent issues, a gastroenterologist can be reached at any hour by calling [phone].   DIET:  We do recommend a small meal at first, but then you may proceed to your regular diet.  Drink  plenty of fluids but you should avoid alcoholic beverages for 24 hours.  MEDICATIONS: Continue present medications.  Please see handouts given to you by your recovery nurse.  ACTIVITY:  You should plan to take it easy for the rest of today and you should NOT DRIVE or use heavy machinery until tomorrow (because of the sedation medicines used during the test).    FOLLOW UP: Our staff will call the number listed on your records the next business day following your procedure to check on you and address any questions or concerns that you may have regarding the information given to you following your procedure. If we do not reach you, we will leave a message.  However, if you are feeling well and you are not experiencing any problems, there is no need to return our call.  We will assume that you have returned to your regular daily activities without incident.  If any biopsies were taken you will be contacted by phone or by letter within the next 1-3 weeks.  Please call us at [phone] if you have not heard about the biopsies in 3 weeks.   Thank you for allowing us to provide for your healthcare needs today.   SIGNATURES/CONFIDENTIALITY: You and/or your care partner have signed paperwork which will be entered into your electronic medical record.  These signatures attest to the fact that that the information above on your After Visit Summary has been reviewed and is   understood.  Full responsibility of the confidentiality of this discharge information lies with you and/or your care-partner. 

## 2017-08-07 NOTE — Progress Notes (Signed)
Report given to PACU, vss 

## 2017-08-08 ENCOUNTER — Telehealth: Payer: Self-pay

## 2017-08-08 NOTE — Telephone Encounter (Signed)
  Follow up Call-  Call back number 08/07/2017  Post procedure Call Back phone  # [phone]  Permission to leave phone message Yes  Some recent data might be hidden     Patient questions:  Do you have a fever, pain , or abdominal swelling? No. Pain Score  0 *  Have you tolerated food without any problems? Yes.    Have you been able to return to your normal activities? Yes.    Do you have any questions about your discharge instructions: Diet   No. Medications  No. Follow up visit  No.  Do you have questions or concerns about your Care? No.  Actions: * If pain score is 4 or above: No action needed, pain <4.

## 2017-08-23 ENCOUNTER — Encounter: Payer: Self-pay | Admitting: Gastroenterology

## 2017-10-29 ENCOUNTER — Encounter: Payer: Self-pay | Admitting: Sports Medicine

## 2017-10-29 ENCOUNTER — Ambulatory Visit (INDEPENDENT_AMBULATORY_CARE_PROVIDER_SITE_OTHER): Payer: Medicare Other | Admitting: Sports Medicine

## 2017-10-29 DIAGNOSIS — M7062 Trochanteric bursitis, left hip: Secondary | ICD-10-CM

## 2017-10-29 DIAGNOSIS — M1612 Unilateral primary osteoarthritis, left hip: Secondary | ICD-10-CM | POA: Insufficient documentation

## 2017-10-29 NOTE — Patient Instructions (Signed)
Hip Rehabilitation Protocol:  1.  Side leg raises.  3x30 with no weight, then 3x15 with 2 lb ankle weight, then 3x15 with 5 lb ankle weight 2.  Standing hip rotation.  3x30 with no weight, then 3x15 with 2 lb ankle weight, then 3x15 with 5 lb ankle weight. 3.  Side step ups.  3x30 with no weight, then 3x15 with 5 lbs in backpack, then 3x15 with 10 lbs in backpack. 

## 2017-10-29 NOTE — Progress Notes (Signed)
Subjective:    I'm seeing this patient as a consultation for: Dr. Olin Hauser  CC: Left hip pain  HPI: This is a pleasant 67 year old male, for months he has had pain that he localizes on the lateral aspect of his left hip, severe, persistent.  Worse laying on the side at night, localized without radiation.  Has tried NSAIDs, physical therapy, no improvement.  Never had injections.  I reviewed the past medical history, family history, social history, surgical history, and allergies today and no changes were needed.  Please see the problem list section below in epic for further details.  Past Medical History: Past Medical History:  Diagnosis Date  . 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 Fracture of left clavicle    2000 / also had infection had to have IV infusion therapy   . GERD (gastroesophageal reflux disease)   . History of chicken pox   . History of kidney stones   . Hyperlipidemia   . Recurrent falls   . Staph skin infection   . Tinnitus   . Varicose veins    Past Surgical History: Past Surgical History:  Procedure Laterality Date  . BRISTOW PROCEDURE     left shoulder   . I&D EXTREMITY     right lower leg   . JOINT REPLACEMENT    . LEG SURGERY Right   . LITHOTRIPSY    . PICC LINE PLACE PERIPHERAL ([place] [facility])     history of / secondary to right lower leg infection   . SHOULDER ARTHROSCOPY Left   . TONGUE SURGERY     robotic assisted with bilateral neck dissection 11/2011  . TOTAL KNEE ARTHROPLASTY Right    partial  . TOTAL KNEE REVISION Right 09/12/2015   Procedure: RIGHT TOTAL KNEE REVISION;  Surgeon: Paralee Cancel, MD;  Location: [facility];  Service: Orthopedics;  Laterality: Right;  . venous ablation right thigh      2009   Social History: Social History   Socioeconomic History  . Marital status: Married    Spouse name: None  . Number of children: 2  . Years of education: None  . Highest education level: None  Social Needs  . Financial resource strain: None    . Food insecurity - worry: None  . Food insecurity - inability: None  . Transportation needs - medical: None  . Transportation needs - non-medical: None  Occupational History  . Occupation: Retired  Tobacco Use  . Smoking status: Former Smoker    Packs/day: 1.00    Years: 9.00    Pack years: 9.00    Types: Cigarettes    Last attempt to quit: 10/15/1980    Years since quitting: 37.0  . Smokeless tobacco: Former Systems developer    Types: [place] date: 10/16/2007  Substance and Sexual Activity  . Alcohol use: Yes    Alcohol/week: 7.2 oz    Types: 12 Cans of beer per week    Comment: 12 beers per week per pt  . Drug use: No  . Sexual activity: None  Other Topics Concern  . None  Social History Narrative  . None   Family History: Family History  Problem Relation Age of Onset  . Hypertension Mother   . Leukemia Father   . Colon cancer Maternal Grandmother 40   Allergies: Allergies  Allergen Reactions  . Percocet [Oxycodone-Acetaminophen] Rash   Medications: See med rec.  Review of Systems: No headache, visual changes, nausea, vomiting, diarrhea,  constipation, dizziness, abdominal pain, skin rash, fevers, chills, night sweats, weight loss, swollen lymph nodes, body aches, joint swelling, muscle aches, chest pain, shortness of breath, mood changes, visual or auditory hallucinations.   Objective:   General: Well Developed, well nourished, and in no acute distress.  Neuro:  Extra-ocular muscles intact, able to move all 4 extremities, sensation grossly intact.  Deep tendon reflexes tested were normal. Psych: Alert and oriented, mood congruent with affect. ENT:  Ears and nose appear unremarkable.  Hearing grossly normal. Neck: Unremarkable overall appearance, trachea midline.  No visible thyroid enlargement. Eyes: Conjunctivae and lids appear unremarkable.  Pupils equal and round. Skin: Warm and dry, no rashes noted.  Cardiovascular: Pulses palpable, no extremity edema. Left  hip: ROM IR: 60 Deg, ER: 60 Deg, Flexion: 120 Deg, Extension: 100 Deg, Abduction: 45 Deg, Adduction: 45 Deg Strength IR: 5/5, ER: 5/5, Flexion: 5/5, Extension: 5/5, Abduction: 5/5, Adduction: 5/5 Pelvic alignment unremarkable to inspection and palpation. Standing hip rotation and gait without trendelenburg / unsteadiness. Greater trochanter with tenderness to palpation. No tenderness over piriformis. No SI joint tenderness and normal minimal SI movement.   Procedure: Real-time Ultrasound Guided Injection of left greater trochanteric bursa Device: GE Logiq E  Verbal informed consent obtained.  Time-out conducted.  Noted no overlying erythema, induration, or other signs of local infection.  Skin prepped in a sterile fashion.  Local anesthesia: Topical Ethyl chloride.  With sterile technique and under real time ultrasound guidance: Using a 22-gauge spinal needle advanced to the greater trochanter, contacted bone and then injected 1 cc Kenalog 40, 2 cc lidocaine, 2 cc bupivacaine, the needle was withdrawn and I also injected some medication superficial to the gluteus medius tendon sheath. Completed without difficulty  Pain immediately resolved suggesting accurate placement of the medication.  Advised to call if fevers/chills, erythema, induration, drainage, or persistent bleeding.  Images permanently stored and available for review in the ultrasound unit.  Impression: Technically successful ultrasound guided injection.  Impression and Recommendations:   This case required medical decision making of moderate complexity.  Greater trochanteric bursitis, left With weak hip abductors. Has failed conservative measures including therapy and NSAIDs, trochanteric bursa injection with ultrasound guidance as above. Aggressive hip abductor rehab given. Return in 1 month. ___________________________________________ Gwen Her. Dianah Field, M.D., ABFM., CAQSM. Primary Care and [place] Instructor of [place] of [facility] of Medicine

## 2017-10-29 NOTE — Assessment & Plan Note (Signed)
With weak hip abductors. Has failed conservative measures including therapy and NSAIDs, trochanteric bursa injection with ultrasound guidance as above. Aggressive hip abductor rehab given. Return in 1 month.

## 2017-11-26 ENCOUNTER — Encounter: Payer: Self-pay | Admitting: Sports Medicine

## 2017-11-26 ENCOUNTER — Ambulatory Visit (INDEPENDENT_AMBULATORY_CARE_PROVIDER_SITE_OTHER): Payer: Medicare Other | Admitting: Sports Medicine

## 2017-11-26 DIAGNOSIS — M5136 Other intervertebral disc degeneration, lumbar region: Secondary | ICD-10-CM

## 2017-11-26 DIAGNOSIS — M51369 Other intervertebral disc degeneration, lumbar region without mention of lumbar back pain or lower extremity pain: Secondary | ICD-10-CM | POA: Insufficient documentation

## 2017-11-26 DIAGNOSIS — M7062 Trochanteric bursitis, left hip: Secondary | ICD-10-CM | POA: Diagnosis not present

## 2017-11-26 NOTE — Assessment & Plan Note (Signed)
We will start conservatively, he did strain his back while doing a lot of work outside. Adding herniated disc rehab exercises. If no better in a few weeks he can return to see me and we can treat him more aggressively.

## 2017-11-26 NOTE — Progress Notes (Signed)
Subjective:    I'm seeing this patient as a consultation for: Dr. Olin Hauser  CC: Low back pain  HPI: This is a pleasant 67 year old male, for the past week he has had pain in the left side of his low back, he has been cleaning out a utility shed.  Pain is moderate, persistent, localized without radiation, no bowel or bladder dysfunction, saddle numbness, constitutional symptoms, trauma.  Trochanteric bursitis: Resolved with injection and rehab exercises  I reviewed the past medical history, family history, social history, surgical history, and allergies today and no changes were needed.  Please see the problem list section below in epic for further details.  Past Medical History: Past Medical History:  Diagnosis Date  . 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 Fracture of left clavicle    2000 / also had infection had to have IV infusion therapy   . GERD (gastroesophageal reflux disease)   . History of chicken pox   . History of kidney stones   . Hyperlipidemia   . Recurrent falls   . Staph skin infection   . Tinnitus   . Varicose veins    Past Surgical History: Past Surgical History:  Procedure Laterality Date  . BRISTOW PROCEDURE     left shoulder   . I&D EXTREMITY     right lower leg   . JOINT REPLACEMENT    . LEG SURGERY Right   . LITHOTRIPSY    . PICC LINE PLACE PERIPHERAL ([place] [facility]X)     history of / secondary to right lower leg infection   . SHOULDER ARTHROSCOPY Left   . TONGUE SURGERY     robotic assisted with bilateral neck dissection 11/2011  . TOTAL KNEE ARTHROPLASTY Right    partial  . TOTAL KNEE REVISION Right 09/12/2015   Procedure: RIGHT TOTAL KNEE REVISION;  Surgeon: Paralee Cancel, MD;  Location: [facility];  Service: Orthopedics;  Laterality: Right;  . venous ablation right thigh      2009   Social History: Social History   Socioeconomic History  . Marital status: Married    Spouse name: None  . Number of children: 2  . Years of education: None  .  Highest education level: None  Social Needs  . Financial resource strain: None  . Food insecurity - worry: None  . Food insecurity - inability: None  . Transportation needs - medical: None  . Transportation needs - non-medical: None  Occupational History  . Occupation: Retired  Tobacco Use  . Smoking status: Former Smoker    Packs/day: 1.00    Years: 9.00    Pack years: 9.00    Types: Cigarettes    Last attempt to quit: 10/15/1980    Years since quitting: 37.1  . Smokeless tobacco: Former Systems developer    Types: [place] date: 10/16/2007  Substance and Sexual Activity  . Alcohol use: Yes    Alcohol/week: 7.2 oz    Types: 12 Cans of beer per week    Comment: 12 beers per week per pt  . Drug use: No  . Sexual activity: None  Other Topics Concern  . None  Social History Narrative  . None   Family History: Family History  Problem Relation Age of Onset  . Hypertension Mother   . Leukemia Father   . Colon cancer Maternal Grandmother 40   Allergies: Allergies  Allergen Reactions  . Percocet [Oxycodone-Acetaminophen] Rash   Medications: See med rec.  Review of  Systems: No headache, visual changes, nausea, vomiting, diarrhea, constipation, dizziness, abdominal pain, skin rash, fevers, chills, night sweats, weight loss, swollen lymph nodes, body aches, joint swelling, muscle aches, chest pain, shortness of breath, mood changes, visual or auditory hallucinations.   Objective:   General: Well Developed, well nourished, and in no acute distress.  Neuro:  Extra-ocular muscles intact, able to move all 4 extremities, sensation grossly intact.  Deep tendon reflexes tested were normal. Psych: Alert and oriented, mood congruent with affect. ENT:  Ears and nose appear unremarkable.  Hearing grossly normal. Neck: Unremarkable overall appearance, trachea midline.  No visible thyroid enlargement. Eyes: Conjunctivae and lids appear unremarkable.  Pupils equal and round. Skin: Warm and dry, no  rashes noted.  Cardiovascular: Pulses palpable, no extremity edema. Back Exam:  Inspection: Unremarkable  Motion: Flexion 45 deg, Extension 45 deg, Side Bending to 45 deg bilaterally,  Rotation to 45 deg bilaterally  SLR laying: Negative  XSLR laying: Negative  Palpable tenderness: Left quadratus lumborum. FABER: negative. Sensory change: Gross sensation intact to all lumbar and sacral dermatomes.  Reflexes: 2+ at both patellar tendons, 2+ at achilles tendons, Babinski's downgoing.  Strength at foot  Plantar-flexion: 5/5 Dorsi-flexion: 5/5 Eversion: 5/5 Inversion: 5/5  Leg strength  Quad: 5/5 Hamstring: 5/5 Hip flexor: 5/5 Hip abductors: 5/5  Gait unremarkable.  Impression and Recommendations:   This case required medical decision making of moderate complexity.  Lumbar degenerative disc disease We will start conservatively, he did strain his back while doing a lot of work outside. Adding herniated disc rehab exercises. If no better in a few weeks he can return to see me and we can treat him more aggressively.  Greater trochanteric bursitis, left Resolved after injection, has been diligent with the hip rehab exercises. ___________________________________________ Gwen Her. Dianah Field, M.D., ABFM., CAQSM. Primary Care and [place] Instructor of [place] of [facility] of Medicine

## 2017-11-26 NOTE — Assessment & Plan Note (Signed)
Resolved after injection, has been diligent with the hip rehab exercises.

## 2017-12-03 ENCOUNTER — Other Ambulatory Visit: Payer: Self-pay

## 2017-12-03 ENCOUNTER — Encounter (HOSPITAL_COMMUNITY): Payer: Self-pay | Admitting: Emergency Medicine

## 2017-12-03 ENCOUNTER — Emergency Department (HOSPITAL_COMMUNITY)
Admission: EM | Admit: 2017-12-03 | Discharge: 2017-12-03 | Disposition: A | Discharge status: Home / Self Care | Payer: Medicare Other | Attending: Emergency Medicine | Admitting: Emergency Medicine

## 2017-12-03 DIAGNOSIS — Z79899 Other long term (current) drug therapy: Secondary | ICD-10-CM | POA: Insufficient documentation

## 2017-12-03 DIAGNOSIS — Z87891 Personal history of nicotine dependence: Secondary | ICD-10-CM | POA: Insufficient documentation

## 2017-12-03 DIAGNOSIS — I10 Essential (primary) hypertension: Secondary | ICD-10-CM

## 2017-12-03 DIAGNOSIS — R079 Chest pain, unspecified: Secondary | ICD-10-CM

## 2017-12-03 LAB — BASIC METABOLIC PANEL
Anion gap: 11 (ref 5–15)
BUN: 13 mg/dL (ref 6–20)
CHLORIDE: 106 mmol/L (ref 101–111)
CO2: 23 mmol/L (ref 22–32)
CREATININE: 0.97 mg/dL (ref 0.61–1.24)
Calcium: 9.6 mg/dL (ref 8.9–10.3)
GFR calc non Af Amer: >60 mL/min (ref >60)
Glucose, Bld: 97 mg/dL (ref 65–99)
POTASSIUM: 4.4 mmol/L (ref 3.5–5.1)
SODIUM: 140 mmol/L (ref 135–145)

## 2017-12-03 LAB — CBC
HCT: 46.3 % (ref 39.0–52.0)
HEMOGLOBIN: 16.1 g/dL (ref 13.0–17.0)
MCH: 34.1 pg — ABNORMAL HIGH (ref 26.0–34.0)
MCHC: 34.8 g/dL (ref 30.0–36.0)
MCV: 98.1 fL (ref 78.0–100.0)
PLATELETS: 171 10*3/uL (ref 150–400)
RBC: 4.72 MIL/uL (ref 4.22–5.81)
RDW: 12.9 % (ref 11.5–15.5)
WBC: 4.6 10*3/uL (ref 4.0–10.5)

## 2017-12-03 LAB — I-STAT TROPONIN, ED: TROPONIN I, POC: 0 ng/mL (ref 0.00–0.08)

## 2017-12-03 NOTE — ED Notes (Signed)
MD Mackuen at bedside.

## 2017-12-03 NOTE — Discharge Instructions (Signed)
The testing today, is reassuring.  There is no sign of damage to heart.  There is mild elevation of your blood pressure today which may be require treatment if it stays elevated.  Call your doctor for a follow-up appointment in 2 or 3 days to recheck her blood pressure and follow-up on the discomfort which you had today.  Return here, if needed, for problems.

## 2017-12-03 NOTE — ED Provider Notes (Signed)
[facility] EMERGENCY DEPARTMENT Provider Note   CSN: [id number] Arrival date & time: 12/03/17  1158     History   Chief Complaint Chief Complaint  Patient presents with  . Chest Pain    HPI Allen Robertson is a 67 y.o. male.  He presents for evaluation of chest pain, described as tightness which died around 9 AM after eating.  No specific exertion.  The pain persisted as a "tight feeling," until he took aspirin and then it got better after about 30 minutes.  He also noticed some tingling in his left hand, following onset of the chest discomfort.  The tingling is almost gone at this time.  He denies stress as a causative factor.  He did have another similar episode in August 2018, at which time he was comprehensively evaluated with a stress test, Lexiscan Myoview, and cardiac echo, both of which were normal.  He was told to take Zantac for possible acid condition.  He subsequently had upper endoscopy and colonoscopy which were normal.  He continues to take Zantac.  This morning he took an 81 mg aspirin but does not usually take it.  There are no other known modifying factors.     HPI  Past Medical History:  Diagnosis Date  . 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 Fracture of left clavicle    2000 / also had infection had to have IV infusion therapy   . GERD (gastroesophageal reflux disease)   . History of chicken pox   . History of kidney stones   . Hyperlipidemia   . Recurrent falls   . Staph skin infection   . Tinnitus   . Varicose veins     Patient Active Problem List   Diagnosis Date Noted  . Lumbar degenerative disc disease 11/26/2017  . Greater trochanteric bursitis, left 10/29/2017  . Chest pain 05/28/2017  . Hyperlipidemia 05/28/2017  . GERD (gastroesophageal reflux disease) 05/28/2017  . Abnormal EKG 05/28/2017  . Adiposity 09/13/2015  . 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 History of knee surgery 09/12/2015  . History of artificial  joint 09/12/2015  . Foreign body (FB) in soft tissue 07/08/2014  . Lesion of soft tissue 07/08/2014  . Joint pain 04/22/2014  . Soft tissue disorder 04/22/2014  . Groin injury 03/31/2013  . Pubic bone pain 03/31/2013  . History of arthroplasty 04/11/2012  . 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 Cancer of tongue ([place]) 11/23/2011  . Tongue mass 11/07/2011  . Snoring 10/19/2011  . Varicose veins 10/19/2011    Past Surgical History:  Procedure Laterality Date  . BRISTOW PROCEDURE     left shoulder   . I&D EXTREMITY     right lower leg   . JOINT REPLACEMENT    . LEG SURGERY Right   . LITHOTRIPSY    . PICC LINE PLACE PERIPHERAL ([place] [facility]X)     history of / secondary to right lower leg infection   . SHOULDER ARTHROSCOPY Left   . TONGUE SURGERY     robotic assisted with bilateral neck dissection 11/2011  . TOTAL KNEE ARTHROPLASTY Right    partial  . TOTAL KNEE REVISION Right 09/12/2015   Procedure: RIGHT TOTAL KNEE REVISION;  Surgeon: Paralee Cancel, MD;  Location: [facility];  Service: Orthopedics;  Laterality: Right;  . venous ablation right thigh      2009       Home Medications  Prior to Admission medications   Medication Sig Start Date End Date Taking? Authorizing Provider  acetaminophen (TYLENOL) 500 MG tablet Take 1,000 mg by mouth every 6 (six) hours as needed for mild pain.   Yes [provider]  acidophilus (RISAQUAD) CAPS capsule Take 1 capsule by mouth as needed (for stomach).   Yes [provider]  atorvastatin (LIPITOR) 10 MG tablet Take 10 mg by mouth daily at 6 PM.    Yes [provider]  ibuprofen (ADVIL,MOTRIN) 800 MG tablet Take 800 mg by mouth every 8 (eight) hours as needed.   Yes [provider]  Melatonin 10 MG TABS Take 1 tablet by mouth at bedtime.   Yes [provider]  ranitidine (ZANTAC) 75 MG tablet Take 75 mg by mouth daily.    Yes [provider]  SUMAtriptan (IMITREX) 25 MG  tablet Take 25 mg by mouth every 2 (two) hours as needed for migraine or headache. May repeat in 2 hours if headache persists or recurs.   Yes [provider]  zolpidem (AMBIEN) 10 MG tablet Take 10 mg by mouth at bedtime as needed for sleep.   Yes [provider]  Multiple Vitamins-Minerals (CENTRUM ADULTS PO) Take 1 tablet by mouth daily.     [provider]    Family History Family History  Problem Relation Age of Onset  . Hypertension Mother   . Leukemia Father   . Colon cancer Maternal Grandmother 66    Social History Social History   Tobacco Use  . Smoking status: Former Smoker    Packs/day: 1.00    Years: 9.00    Pack years: 9.00    Types: Cigarettes    Last attempt to quit: 10/15/1980    Years since quitting: 37.1  . Smokeless tobacco: Former Systems developer    Types: [place] date: 10/16/2007  Substance Use Topics  . Alcohol use: Yes    Alcohol/week: 7.2 oz    Types: 12 Cans of beer per week    Comment: 12 beers per week per pt  . Drug use: No     Allergies   Percocet [oxycodone-acetaminophen]   Review of Systems Review of Systems  All other systems reviewed and are negative.    Physical Exam Updated Vital Signs BP (!) 151/91   Pulse 72   Temp 97.7 F (36.5 C) (Oral)   Resp (!) 9   Ht _0  (1.88 m)   Wt 121.6 kg (268 lb)   SpO2 99%   BMI 34.41 kg/m   Physical Exam  Constitutional: He is oriented to person, place, and time. He appears well-developed and well-nourished. He does not appear ill.  HENT:  Head: Normocephalic and atraumatic.  Right Ear: External ear normal.  Left Ear: External ear normal.  Eyes: Conjunctivae and EOM are normal. Pupils are equal, round, and reactive to light.  Neck: Normal range of motion and phonation normal. Neck supple.  Cardiovascular: Normal rate, regular rhythm and normal heart sounds.  Pulmonary/Chest: Effort normal and breath sounds normal. He exhibits no bony tenderness.  Abdominal: Soft.  There is no tenderness.  Musculoskeletal: Normal range of motion.  Neurological: He is alert and oriented to person, place, and time. No cranial nerve deficit or sensory deficit. He exhibits normal muscle tone. Coordination normal.  Skin: Skin is warm, dry and intact.  Psychiatric: He has a normal mood and affect. His behavior is normal. Judgment and thought content normal.  Nursing note  and vitals reviewed.    ED Treatments / Results  Labs (all labs ordered are listed, but only abnormal results are displayed) Labs Reviewed  CBC - Abnormal; Notable for the following components:      Result Value   MCH 34.1 (*)    All other components within normal limits  BASIC METABOLIC PANEL  I-STAT TROPONIN, ED    EKG  EKG Interpretation  Date/Time:  Tuesday December 03 2017 12:28:22 EST Ventricular Rate:  86 PR Interval:  168 QRS Duration: 76 QT Interval:  352 QTC Calculation: 421 R Axis:   21 Text Interpretation:  diffuse st elevation slightly different than 20 min ago. Pt has no actvie CP Confirmed by [facility], Courteney [phone]) on 12/03/2017 12:52:08 PM       Radiology No results found.  Procedures Procedures (including critical care time)  Medications Ordered in ED Medications - No data to display   Initial Impression / Assessment and Plan / ED Course  I have reviewed the triage vital signs and the nursing notes.  Pertinent labs & imaging results that were available during my care of the patient were reviewed by me and considered in my medical decision making (see chart for details).      Patient Vitals for the past 24 hrs:  BP Temp Temp src Pulse Resp SpO2 Height Weight  12/03/17 1401 - - - 72 (!) 9 99 % - -  12/03/17 1400 (!) 151/91 - - - - - - -  12/03/17 1208 (!) 146/86 97.7 F (36.5 C) Oral 83 18 97 % _0  (1.88 m) 121.6 kg (268 lb)    3:03 PM Reevaluation with update and discussion. After initial assessment and treatment, an updated evaluation reveals no  change in clinical status, he remains comfortable, findings discussed with patient and wife all questions answered. Daleen Bo     Final Clinical Impressions(s) / ED Diagnoses   Final diagnoses:  Nonspecific chest pain  Hypertension, unspecified type    Nonspecific chest pain, low risk patient, with recent cardiac evaluation, stress test and cardiac echo.  These tests were normal.  Doubt ACS, PE or pneumonia.  Screening today with EKG shows not changed.  Cardiac troponin, CBC and met all normal.  Chest x-ray deferred since he does not have respiratory symptoms.  Blood pressure mildly elevated, nonspecific.  Nursing Notes Reviewed/ Care Coordinated Applicable Imaging Reviewed Interpretation of Laboratory Data incorporated into ED treatment  The patient appears reasonably screened and/or stabilized for discharge and I doubt any other medical condition or other [facility] requiring further screening, evaluation, or treatment in the ED at this time prior to discharge.  Plan: Home Medications-OTC, as needed, continue current prescribed medications; Home Treatments-rest; return here if the recommended treatment, does not improve the symptoms; Recommended follow up-PCP 2 or 3 days for blood pressure check   ED Discharge Orders    None       Daleen Bo, MD 12/03/17 1505

## 2017-12-03 NOTE — ED Triage Notes (Signed)
Pt states at 0900 he had a sudden onset of chest pain that radiates into his left arm. Pt states he felt Flushed through his head into chest.

## 2017-12-03 NOTE — ED Notes (Signed)
Pt took 1 81 mg Asa at 0930.

## 2017-12-03 NOTE — ED Provider Notes (Signed)
EKG showed slight ST elevations diffusely.  EKG repeated appears the same.  Discussed with patient no active chest pain.  Discussed with Dr. [place] from cardiology?  No need for acute intervention.  We will continue his workup, no intevetnion necessary   Macarthur Critchley, MD 12/03/17 1251

## 2017-12-03 NOTE — ED Notes (Addendum)
MD [facility] reviewing 2nd EKG.

## 2018-02-18 ENCOUNTER — Encounter: Payer: Self-pay | Admitting: Emergency Medicine

## 2018-02-18 ENCOUNTER — Emergency Department (INDEPENDENT_AMBULATORY_CARE_PROVIDER_SITE_OTHER): Payer: Medicare Other

## 2018-02-18 ENCOUNTER — Emergency Department (INDEPENDENT_AMBULATORY_CARE_PROVIDER_SITE_OTHER)
Admission: EM | Admit: 2018-02-18 | Discharge: 2018-02-18 | Disposition: A | Discharge status: Home / Self Care | Payer: Medicare Other | Source: Home / Self Care | Attending: Family Medicine | Admitting: Family Medicine

## 2018-02-18 DIAGNOSIS — M542 Cervicalgia: Secondary | ICD-10-CM

## 2018-02-18 DIAGNOSIS — M2578 Osteophyte, vertebrae: Secondary | ICD-10-CM | POA: Diagnosis not present

## 2018-02-18 DIAGNOSIS — M7918 Myalgia, other site: Secondary | ICD-10-CM

## 2018-02-18 DIAGNOSIS — M50123 Cervical disc disorder at C6-C7 level with radiculopathy: Secondary | ICD-10-CM

## 2018-02-18 DIAGNOSIS — M47894 Other spondylosis, thoracic region: Secondary | ICD-10-CM

## 2018-02-18 LAB — POCT CBC W AUTO DIFF (K'VILLE URGENT CARE)

## 2018-02-18 LAB — SEDIMENTATION RATE: SED RATE: 2 mm/h (ref 0–20)

## 2018-02-18 MED ORDER — PREDNISONE 20 MG PO TABS: ORAL_TABLET | ORAL | 0 refills | Status: DC

## 2018-02-18 NOTE — ED Provider Notes (Signed)
[name]    CSN: [id number] Arrival date & time: 02/18/18  1044     History   Chief Complaint Chief Complaint  Patient presents with  . Shoulder Pain    Right shoulder/upper back pain    HPI ETHERIDGE GEIL is a 67 y.o. male.   Patient reports that he played golf two days ago and subsequently had pain in his right neck and scapular area.  The pain has not improved with application of heat, ibuprofen, and massage.  He has a history of chronic right shoulder pain as a result of past injury, but denies increase in shoulder pain.  He feels well otherwise. He has a past history of osteomyelitis of T6-T7, and states that his back pain now feels somewhat similar.    The history is provided by the patient and the spouse.  Back Pain  Location:  Thoracic spine (right scapula) Quality:  Aching Radiates to: right neck. Pain severity:  Mild Pain is:  Same all the time Onset quality:  Sudden Duration:  2 days Timing:  Constant Progression:  Unchanged Chronicity:  New Context comment:  Playing golf Relieved by:  Nothing Exacerbated by: neck movement. Ineffective treatments:  NSAIDs and heating pad Associated symptoms: no fever, no headaches, no numbness, no paresthesias, no weakness and no weight loss     Past Medical History:  Diagnosis Date  . Cancer ([place]) 2013   tongue  . Fracture of left clavicle    2000 / also had infection had to have IV infusion therapy   . GERD (gastroesophageal reflux disease)   . History of chicken pox   . History of kidney stones   . Hyperlipidemia   . Recurrent falls   . Staph skin infection   . Tinnitus   . Varicose veins     Patient Active Problem List   Diagnosis Date Noted  . Lumbar degenerative disc disease 11/26/2017  . Greater trochanteric bursitis, left 10/29/2017  . Chest pain 05/28/2017  . Hyperlipidemia 05/28/2017  . GERD (gastroesophageal reflux disease) 05/28/2017  . Abnormal EKG 05/28/2017  . Adiposity  09/13/2015  . 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 History of knee surgery 09/12/2015  . History of artificial joint 09/12/2015  . Foreign body (FB) in soft tissue 07/08/2014  . Lesion of soft tissue 07/08/2014  . Joint pain 04/22/2014  . Soft tissue disorder 04/22/2014  . Groin injury 03/31/2013  . Pubic bone pain 03/31/2013  . History of arthroplasty 04/11/2012  . 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 Cancer of tongue ([place]) 11/23/2011  . Tongue mass 11/07/2011  . Snoring 10/19/2011  . Varicose veins 10/19/2011    Past Surgical History:  Procedure Laterality Date  . BRISTOW PROCEDURE     left shoulder   . I&D EXTREMITY     right lower leg   . JOINT REPLACEMENT    . LEG SURGERY Right   . LITHOTRIPSY    . PICC LINE PLACE PERIPHERAL ([place] HX)     history of / secondary to right lower leg infection   . SHOULDER ARTHROSCOPY Left   . TONGUE SURGERY     robotic assisted with bilateral neck dissection 11/2011  . TOTAL KNEE ARTHROPLASTY Right    partial  . TOTAL KNEE REVISION Right 09/12/2015   Procedure: RIGHT TOTAL KNEE REVISION;  Surgeon: Paralee Cancel, MD;  Location: [facility];  Service: Orthopedics;  Laterality: Right;  . venous ablation right  thigh      2009       Home Medications    Prior to Admission medications   Medication Sig Start Date End Date Taking? Authorizing Provider  acetaminophen (TYLENOL) 500 MG tablet Take 1,000 mg by mouth every 6 (six) hours as needed for mild pain.    [provider]  acidophilus (RISAQUAD) CAPS capsule Take 1 capsule by mouth as needed (for stomach).    [provider]  atorvastatin (LIPITOR) 10 MG tablet Take 10 mg by mouth daily at 6 PM.     [provider]  ibuprofen (ADVIL,MOTRIN) 800 MG tablet Take 800 mg by mouth every 8 (eight) hours as needed.    [provider]  Melatonin 10 MG TABS Take 1 tablet by mouth at bedtime.    [provider]  Multiple  Vitamins-Minerals (CENTRUM ADULTS PO) Take 1 tablet by mouth daily.     [provider]  predniSONE (DELTASONE) 20 MG tablet Take one tab by mouth twice daily for 4 days, then one daily for 3 days. Take with food. 02/18/18   Kandra Nicolas, MD  ranitidine (ZANTAC) 75 MG tablet Take 75 mg by mouth daily.     [provider]  SUMAtriptan (IMITREX) 25 MG tablet Take 25 mg by mouth every 2 (two) hours as needed for migraine or headache. May repeat in 2 hours if headache persists or recurs.    [provider]  zolpidem (AMBIEN) 10 MG tablet Take 10 mg by mouth at bedtime as needed for sleep.    [provider]    Family History Family History  Problem Relation Age of Onset  . Hypertension Mother   . Leukemia Father   . Colon cancer Maternal Grandmother 48    Social History Social History   Tobacco Use  . Smoking status: Former Smoker    Packs/day: 1.00    Years: 9.00    Pack years: 9.00    Types: Cigarettes    Last attempt to quit: 10/15/1980    Years since quitting: 37.3  . Smokeless tobacco: Former Systems developer    Types: [place] date: 10/16/2007  Substance Use Topics  . Alcohol use: Yes    Alcohol/week: 7.2 oz    Types: 12 Cans of beer per week    Comment: 12 beers per week per pt  . Drug use: No     Allergies   Percocet [oxycodone-acetaminophen]   Review of Systems Review of Systems  Constitutional: Positive for fatigue. Negative for activity change, appetite change, chills, diaphoresis, fever, unexpected weight change and weight loss.  HENT: Negative.   Eyes: Negative.   Respiratory: Negative.   Cardiovascular: Negative.   Gastrointestinal: Negative.   Genitourinary: Negative.   Musculoskeletal: Positive for back pain and neck pain. Negative for myalgias and neck stiffness.  Skin: Negative.   Neurological: Negative for weakness, numbness, headaches and paresthesias.     Physical Exam Triage Vital Signs ED Triage Vitals  Enc  Vitals Group     BP 02/18/18 1110 (!) 158/92     Pulse Rate 02/18/18 1110 79     Resp 02/18/18 1110 18     Temp 02/18/18 1110 98.2 F (36.8 C)     Temp Source 02/18/18 1110 Oral     SpO2 02/18/18 1110 99 %     Weight 02/18/18 1111 269 lb (122 kg)     Height 02/18/18 1111 6\' 2"  (1.88 m)  Head Circumference --      Peak Flow --      Pain Score 02/18/18 1111 6     Pain Loc --      Pain Edu? --      Excl. in [place]? --    No data found.  Updated Vital Signs BP (!) 158/92 (BP Location: Right Arm)   Pulse 79   Temp 98.2 F (36.8 C) (Oral)   Resp 18   Ht 6\' 2"  (1.88 m)   Wt 269 lb (122 kg)   SpO2 99%   BMI 34.54 kg/m   Visual Acuity Right Eye Distance:   Left Eye Distance:   Bilateral Distance:    Right Eye Near:   Left Eye Near:    Bilateral Near:     Physical Exam  Constitutional: He appears well-developed and well-nourished. No distress.  HENT:  Head: Normocephalic.  Right Ear: External ear normal.  Left Ear: External ear normal.  Nose: Nose normal.  Mouth/Throat: Oropharynx is clear and moist.  Eyes: Pupils are equal, round, and reactive to light.  Neck: Normal range of motion. Neck supple.    There is mild posterior neck tenderness to palpation as noted on diagram.  Patient's pain is increased by right lateral flexion of his neck.  Cardiovascular: Normal heart sounds.  Pulmonary/Chest: Breath sounds normal.      Mild tenderness to palpation thoracic spine as noted on diagram.  There is tenderness to palpation right rhomboid muscles.   Musculoskeletal: He exhibits no edema.       Right shoulder: He exhibits decreased range of motion. He exhibits no tenderness and no bony tenderness.  Right shoulder:  No tenderness to palpation.  Apley test negative.  Empty can negative.  Lymphadenopathy:    He has no cervical adenopathy.  Neurological: He is alert.  Skin: Skin is warm and dry. Rash noted.  Nursing note and vitals reviewed.    UC Treatments / Results    Labs (all labs ordered are listed, but only abnormal results are displayed) Labs Reviewed  SEDIMENTATION RATE  POCT CBC W AUTO DIFF ([facility]):  WBC 4.1; LY 38.8; MO 5.8; GR 55.4; Hgb 15.8; Platelets 182     EKG None  Radiology Dg Cervical Spine Complete  Result Date: 02/18/2018 CLINICAL DATA:  Right-sided neck and upper back pain. Numbness and tingling in right upper extremity. EXAM: CERVICAL SPINE - COMPLETE 4+ VIEW COMPARISON:  None. FINDINGS: The cervical spine is well seen to the mid C7 level. The inferior half of C7 and the cervicothoracic junction are poorly visualized. No malalignment. No fractures. Moderate multilevel degenerative disc disease with anterior osteophytes. Probable tiny posterior osteophyte at C6-7. The neural foramina are patent. The lateral masses of C1 align with C2. The odontoid process is unremarkable. IMPRESSION: Moderate degenerative changes. Electronically Signed   By: Dorise Bullion III M.D   On: 02/18/2018 12:32   Dg Thoracic Spine 2 View  Result Date: 02/18/2018 CLINICAL DATA:  Pain.  No trauma. EXAM: THORACIC SPINE 2 VIEWS COMPARISON:  Chest x-ray May 27, 2017 FINDINGS: Limited views of the chest are normal. No traumatic malalignment. Severe degenerative changes with anterior osteophytes. No compression fractures are identified. No other abnormalities. IMPRESSION: Severe degenerative changes with anterior osteophytes. No other acute abnormalities. Electronically Signed   By: Dorise Bullion III M.D   On: 02/18/2018 12:34    Procedures Procedures (including critical care time)  Medications Ordered in UC Medications - No data to  display  Initial Impression / Assessment and Plan / UC Course  I have reviewed the triage vital signs and the nursing notes.  Pertinent labs & imaging results that were available during my care of the patient were reviewed by me and considered in my medical decision making (see chart for details).    Normal CBC  reassuring.  No evidence thoracic osteomyelitis at this time, but will check sed rate. ? Mild cervical radiculopathy and/or muscle spasm from physical activity, in combination with DJD of cervical and thoracic spine. Will begin prednisone burst/taper. Followup with Dr. Aundria Mems or Dr. Lynne Leader ([facility]) if not improving about two weeks.    Final Clinical Impressions(s) / UC Diagnoses   Final diagnoses:  Cervical muscle pain  Other osteoarthritis of spine, thoracic region  Rhomboid muscle pain     Discharge Instructions     Apply ice pack for 20 to 30 minutes, 3 to 4 times daily  Continue until pain decreases.  May take Tylenol at bedtime as needed for pain.    ED Prescriptions    Medication Sig Dispense Auth. Provider   predniSONE (DELTASONE) 20 MG tablet Take one tab by mouth twice daily for 4 days, then one daily for 3 days. Take with food. 11 tablet Kandra Nicolas, MD         Kandra Nicolas, MD 02/18/18 [phone]

## 2018-02-18 NOTE — ED Triage Notes (Signed)
67 y.o old male presents today with right shoulder pain radiating to the upper back. He states it started on Sunday after swinging the golf club. He states on Friday he had thumb pain radiating to his elbow. He states this has happened in the past.

## 2018-02-18 NOTE — Discharge Instructions (Signed)
Apply ice pack for 20 to 30 minutes, 3 to 4 times daily  Continue until pain decreases.  May take Tylenol at bedtime as needed for pain.

## 2018-02-19 ENCOUNTER — Telehealth: Payer: Self-pay

## 2018-02-19 NOTE — Telephone Encounter (Signed)
Spoke with patient feeling better.  Will follow up as needed.  Lab results given.

## 2018-08-14 ENCOUNTER — Other Ambulatory Visit: Payer: Self-pay

## 2018-08-14 ENCOUNTER — Emergency Department (INDEPENDENT_AMBULATORY_CARE_PROVIDER_SITE_OTHER)
Admission: EM | Admit: 2018-08-14 | Discharge: 2018-08-14 | Disposition: A | Discharge status: Home / Self Care | Payer: Medicare Other | Source: Home / Self Care | Attending: Family Medicine | Admitting: Family Medicine

## 2018-08-14 ENCOUNTER — Encounter: Payer: Self-pay | Admitting: Emergency Medicine

## 2018-08-14 DIAGNOSIS — R079 Chest pain, unspecified: Secondary | ICD-10-CM

## 2018-08-14 DIAGNOSIS — B349 Viral infection, unspecified: Secondary | ICD-10-CM | POA: Diagnosis not present

## 2018-08-14 NOTE — ED Triage Notes (Signed)
Sneezing yesterday, Cluster headache last night, took Immitrex about one hour later chest tightness, left arm numbness, SOB.  Today feeling lightheaded, feels like I'm getting a cold.  He had a similar episode one year ago and saw a cardiologist.

## 2018-08-14 NOTE — Discharge Instructions (Addendum)
If increasing cold-like symptoms develop, try the following: Take plain guaifenesin (1200mg  extended release tabs such as Mucinex) twice daily, with plenty of water, for cough and congestion.  May add Pseudoephedrine (30mg , one or two every 4 to 6 hours) for sinus congestion.  Get adequate rest.   May use Afrin nasal spray (or generic oxymetazoline) each morning for about 5 days and then discontinue.  Also recommend using saline nasal spray several times daily and saline nasal irrigation (AYR is a common brand).   Try warm salt water gargles for sore throat.  Stop all antihistamines (Nyquil, etc) for now, and other non-prescription cough/cold preparations. May take Tylenol as needed for pain, fever, etc. May take Delsym Cough Suppressant at bedtime for nighttime cough.

## 2018-08-14 NOTE — ED Provider Notes (Signed)
[name]    CSN: [id number] Arrival date & time: 08/14/18  1120     History   Chief Complaint Chief Complaint  Patient presents with  . Chest Pain    HPI Allen Robertson is a 67 y.o. male.   Patient states that he developed nasal congestion and sneezing yesterday.  He reports that he developed a typical cluster headache last night, improved with Imitrex.  His wife reports that he transported a heavy hand-truck with frozen chickens down narrow stairs last night.  At about 11:30pm last night he noted chest tightness and mild tingling in his left arm and shoulder.  Today he feels somewhat light-headed and fatigued, like he might be developing a cold. He had similar chest symptoms about a year ago, and was evaluated by a cardiologist with negative findings.  The history is provided by the patient and the spouse.    Past Medical History:  Diagnosis Date  . Cancer ([place]) 2013   tongue  . Fracture of left clavicle    2000 / also had infection had to have IV infusion therapy   . GERD (gastroesophageal reflux disease)   . History of chicken pox   . History of kidney stones   . Hyperlipidemia   . Recurrent falls   . Staph skin infection   . Tinnitus   . Varicose veins     Patient Active Problem List   Diagnosis Date Noted  . Lumbar degenerative disc disease 11/26/2017  . Greater trochanteric bursitis, left 10/29/2017  . Chest pain 05/28/2017  . Hyperlipidemia 05/28/2017  . GERD (gastroesophageal reflux disease) 05/28/2017  . Abnormal EKG 05/28/2017  . Adiposity 09/13/2015  . 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 History of knee surgery 09/12/2015  . History of artificial joint 09/12/2015  . Foreign body (FB) in soft tissue 07/08/2014  . Lesion of soft tissue 07/08/2014  . Joint pain 04/22/2014  . Soft tissue disorder 04/22/2014  . Groin injury 03/31/2013  . Pubic bone pain 03/31/2013  . History of arthroplasty 04/11/2012  . Squamous cell  carcinoma of base of tongue ([place]) 11/28/2011  . Cancer of tongue ([place]) 11/23/2011  . Tongue mass 11/07/2011  . Snoring 10/19/2011  . Varicose veins 10/19/2011    Past Surgical History:  Procedure Laterality Date  . BRISTOW PROCEDURE     left shoulder   . I&D EXTREMITY     right lower leg   . JOINT REPLACEMENT    . LEG SURGERY Right   . LITHOTRIPSY    . PICC LINE PLACE PERIPHERAL ([place] HX)     history of / secondary to right lower leg infection   . SHOULDER ARTHROSCOPY Left   . TONGUE SURGERY     robotic assisted with bilateral neck dissection 11/2011  . TOTAL KNEE ARTHROPLASTY Right    partial  . TOTAL KNEE REVISION Right 09/12/2015   Procedure: RIGHT TOTAL KNEE REVISION;  Surgeon: Paralee Cancel, MD;  Location: [facility];  Service: Orthopedics;  Laterality: Right;  . venous ablation right thigh      2009       Home Medications    Prior to Admission medications   Medication Sig Start Date End Date Taking? Authorizing Provider  acetaminophen (TYLENOL) 500 MG tablet Take 1,000 mg by mouth every 6 (six) hours as needed for mild pain.    [provider]  acidophilus (RISAQUAD) CAPS capsule Take 1 capsule by mouth as needed (for stomach).  [provider]  atorvastatin (LIPITOR) 10 MG tablet Take 10 mg by mouth daily at 6 PM.     [provider]  ibuprofen (ADVIL,MOTRIN) 800 MG tablet Take 800 mg by mouth every 8 (eight) hours as needed.    [provider]  Melatonin 10 MG TABS Take 1 tablet by mouth at bedtime.    [provider]  Multiple Vitamins-Minerals (CENTRUM ADULTS PO) Take 1 tablet by mouth daily.     [provider]  ranitidine (ZANTAC) 75 MG tablet Take 75 mg by mouth daily.     [provider]  SUMAtriptan (IMITREX) 25 MG tablet Take 25 mg by mouth every 2 (two) hours as needed for migraine or headache. May repeat in 2 hours if headache persists or recurs.    [provider]  zolpidem (AMBIEN) 10  MG tablet Take 10 mg by mouth at bedtime as needed for sleep.    [provider]    Family History Family History  Problem Relation Age of Onset  . Hypertension Mother   . Leukemia Father   . Colon cancer Maternal Grandmother 45    Social History Social History   Tobacco Use  . Smoking status: Former Smoker    Packs/day: 1.00    Years: 9.00    Pack years: 9.00    Types: Cigarettes    Last attempt to quit: 10/15/1980    Years since quitting: 37.8  . Smokeless tobacco: Former Systems developer    Types: [place] date: 10/16/2007  Substance Use Topics  . Alcohol use: Yes    Alcohol/week: 12.0 standard drinks    Types: 12 Cans of beer per week    Comment: 12 beers per week per pt  . Drug use: No     Allergies   Percocet [oxycodone-acetaminophen]   Review of Systems Review of Systems No sore throat No cough No pleuritic pain No wheezing + nasal congestion + post-nasal drainage + sneezing No sinus pain/pressure No itchy/red eyes No earache No hemoptysis No SOB No fever/chills No nausea No vomiting No abdominal pain No diarrhea No urinary symptoms No skin rash + fatigue No myalgias + headache Used OTC meds without relief   Physical Exam Triage Vital Signs ED Triage Vitals [08/14/18 1203]  Enc Vitals Group     BP (!) 148/94     Pulse Rate 85     Resp      Temp 98.2 F (36.8 C)     Temp Source Oral     SpO2 94 %     Weight 270 lb (122.5 kg)     Height 6\' 2"  (1.88 m)     Head Circumference      Peak Flow      Pain Score 0     Pain Loc      Pain Edu?      Excl. in [place]?    No data found.  Updated Vital Signs BP (!) 148/94 (BP Location: Right Arm)   Pulse 85   Temp 98.2 F (36.8 C) (Oral)   Ht 6\' 2"  (1.88 m)   Wt 122.5 kg   SpO2 94%   BMI 34.67 kg/m   Visual Acuity Right Eye Distance:   Left Eye Distance:   Bilateral Distance:    Right Eye Near:   Left Eye Near:    Bilateral Near:     Physical Exam Nursing notes and Vital Signs  reviewed. Appearance:  Patient appears stated  age, and in no acute distress Eyes:  Pupils are equal, round, and reactive to light and accomodation.  Extraocular movement is intact.  Conjunctivae are not inflamed  Ears:  Canals normal.  Tympanic membranes normal.  Nose:  Mildly congested turbinates.  No sinus tenderness.   Pharynx:  Normal Neck:  Supple.  Enlarged posterior/lateral nodes are palpated bilaterally, tender to palpation on the left.   Lungs:  Clear to auscultation.  Breath sounds are equal.  Moving air well.  No chest wall tenderness. Heart:  Regular rate and rhythm without murmurs, rubs, or gallops.  Abdomen:  Nontender without masses or hepatosplenomegaly.  Bowel sounds are present.  No CVA or flank tenderness.  Extremities:  No edema.  Skin:  No rash present.    UC Treatments / Results  Labs (all labs ordered are listed, but only abnormal results are displayed) Labs Reviewed - No data to display  EKG  Rate:  73 BPM PR:  176 msec QT:  378 msec QTcH:  416 msec QRSD:  82 msec QRS axis:  16 degrees Interpretation:   normal sinus rhythm; no acute changes.  No interval change from EKG done 12/03/17.   Radiology No results found.  Procedures Procedures (including critical care time)  Medications Ordered in UC Medications - No data to display  Initial Impression / Assessment and Plan / UC Course  I have reviewed the triage vital signs and the nursing notes.  Pertinent labs & imaging results that were available during my care of the patient were reviewed by me and considered in my medical decision making (see chart for details).    Suspect early viral URI.  Treat symptomatically for now. Followup with Family Doctor if not improved in one week.    Final Clinical Impressions(s) / UC Diagnoses   Final diagnoses:  Chest pain, unspecified type  Acute viral syndrome     Discharge Instructions     If increasing cold-like symptoms develop, try the  following: Take plain guaifenesin (1200mg  extended release tabs such as Mucinex) twice daily, with plenty of water, for cough and congestion.  May add Pseudoephedrine (30mg , one or two every 4 to 6 hours) for sinus congestion.  Get adequate rest.   May use Afrin nasal spray (or generic oxymetazoline) each morning for about 5 days and then discontinue.  Also recommend using saline nasal spray several times daily and saline nasal irrigation (AYR is a common brand).   Try warm salt water gargles for sore throat.  Stop all antihistamines (Nyquil, etc) for now, and other non-prescription cough/cold preparations. May take Tylenol as needed for pain, fever, etc. May take Delsym Cough Suppressant at bedtime for nighttime cough.       ED Prescriptions    None        Kandra Nicolas, MD 08/17/18 1420

## 2018-08-17 ENCOUNTER — Telehealth: Payer: Self-pay | Admitting: Emergency Medicine

## 2018-08-17 NOTE — Telephone Encounter (Signed)
Spoke with patient. He is feeling better today. Left side numbness and chest pain has resolved. Advised to f/u with Family doctor for office visit. Verbalized understanding.

## 2019-01-19 ENCOUNTER — Telehealth: Payer: Self-pay | Admitting: Cardiology

## 2019-01-19 NOTE — Telephone Encounter (Signed)
Reports heaviness in chest after doing yard work 2 weeks ago. This past Saturday, was doing yard work again and lifting and pulling heavy items to put down in yard, after bending over and standing back up he felt lightheadedness, and the heavy chest pain returned rated 4-5/10 lasting one hour, also became sweaty, and clammy. Took an aspirin 81 mg and another after 30 minutes since pain continued. Continues to feel a pressure in chest rated 1-2/10. Feels weakness in both arms. Denies sob, swelling, n/v. Medications and allergies reviewed. Advised to check vitals prior to visit. Advised if symptoms get worse, to go to the ED for an evaluation. Verbalized understanding.  The patient verbally consented for a telehealth phone visit today with [facility] and understands that her insurance company will be billed for the encounter.

## 2019-01-19 NOTE — Telephone Encounter (Signed)
° °  Patient calling to report on Saturday after doing yard work started having chest pain. Felt better on Sunday. BP 129/89 today   Pt c/o of Chest Pain: STAT if CP now or developed within 24 hours  1. Are you having CP right now? Unsure, feels like "heartburn" per patient  2. Are you experiencing any other symptoms (ex. SOB, nausea, vomiting, sweating)? Sweating, weakness in arms, some shortness of breath  3. How long have you been experiencing CP? 3 days  4. Is your CP continuous or coming and going? Coming and going  5. Have you taken Nitroglycerin? no?

## 2019-01-20 ENCOUNTER — Other Ambulatory Visit: Payer: Self-pay

## 2019-01-20 ENCOUNTER — Encounter: Payer: Self-pay | Admitting: Cardiology

## 2019-01-20 ENCOUNTER — Telehealth (INDEPENDENT_AMBULATORY_CARE_PROVIDER_SITE_OTHER): Payer: Medicare Other | Admitting: Cardiology

## 2019-01-20 VITALS — BP 137/82 | HR 70 | Ht 74.0 in | Wt 252.0 lb

## 2019-01-20 DIAGNOSIS — R072 Precordial pain: Secondary | ICD-10-CM

## 2019-01-20 MED ORDER — METOPROLOL SUCCINATE ER 50 MG PO TB24: 50.0000 mg | ORAL_TABLET | Freq: Every day | ORAL | 3 refills | Status: DC

## 2019-01-20 MED ORDER — NITROGLYCERIN 0.4 MG SL SUBL: 0.4000 mg | SUBLINGUAL_TABLET | SUBLINGUAL | 3 refills | Status: DC | PRN

## 2019-01-20 NOTE — Progress Notes (Signed)
Virtual Visit via Video Note   This visit type was conducted due to national recommendations for restrictions regarding the COVID-19 Pandemic (e.g. social distancing) in an effort to limit this patient's exposure and mitigate transmission in our community.  Due to his co-morbid illnesses, this patient is at least at moderate risk for complications without adequate follow up.  This format is felt to be most appropriate for this patient at this time.  All issues noted in this document were discussed and addressed.  A limited physical exam was performed with this format.  Please refer to the patient's chart for his consent to telehealth for [facility].   Of note we did end up conducting this is a telephone visit after multiple attempts at setting up the video visit including downloading the WebEx app.  Evaluation Performed:  Follow-up visit  Date:  01/20/2019   ID:  Allen [facility], DOB 04/29/1951, MRN [id number]  Patient Location: Home  Provider Location: Home  PCP:  Ivan Anchors, MD  Cardiologist:  Minus Breeding, MD  Electrophysiologist:  None   Chief Complaint:  Chest pain  History of Present Illness:    Allen Robertson is a 68 y.o. male who presents via audio/video conferencing for a telehealth visit today.    He has a history of chest discomfort.  He has had a negative stress test in 2018.  There was a questionably low ejection fraction on that stress test but echocardiogram demonstrated a normal ejection fraction.  He says he is also had an echo at the [place] in December and I do not have these results.  He also says it was normal at that time.  He was in the hospital most recently in January of last year for non-anginal chest pain.  On Saturday of this week he is again had chest discomfort similar to those 2 episodes.  He is able to walk 4 to 5 miles without bringing on this discomfort and he did that on Saturday.  In fact he walked a couple of miles yesterday without discomfort.   However, on Saturday he was doing some yard work putting down some [employer].  After he was done he had some discomfort in his chest.  He said it was 5 out of 10.  He came into the hospital and he took some aspirin and some water.  He had some orange juice.  He had some numbness in his hands.  He felt a little sweaty.  And thought things lasted for about an hour and a half before went away.  At night he slept in a recliner.  The next day he was doing some dishes and he felt a less significant but still similar event.  He denies any PND or orthopnea.  He is had no palpitations, presyncope or syncope.  He has had no nausea vomiting or diaphoresis.  He was treated with omeprazole by his [place] doctor for some symptoms several months ago.  Not sure what the symptoms were or whether that made any difference.  Again he has been able to do other activities such as walking for exercise without bringing on any of the symptoms.  He is had no palpitations, presyncope or syncope.  The patient does not have symptoms concerning for COVID-19 infection (fever, chills, cough, or new shortness of breath).    Past Medical History:  Diagnosis Date  . Cancer ([place]) 2013   tongue  . Fracture of left clavicle    2000 /  also had infection had to have IV infusion therapy   . GERD (gastroesophageal reflux disease)   . History of chicken pox   . History of kidney stones   . Hyperlipidemia   . Recurrent falls   . Staph skin infection   . Tinnitus   . Varicose veins    Past Surgical History:  Procedure Laterality Date  . BRISTOW PROCEDURE     left shoulder   . I&D EXTREMITY     right lower leg   . JOINT REPLACEMENT    . LEG SURGERY Right   . LITHOTRIPSY    . PICC LINE PLACE PERIPHERAL ([place] [facility])     history of / secondary to right lower leg infection   . SHOULDER ARTHROSCOPY Left   . TONGUE SURGERY     robotic assisted with bilateral neck dissection 11/2011  . TOTAL KNEE ARTHROPLASTY Right    partial  . TOTAL KNEE  REVISION Right 09/12/2015   Procedure: RIGHT TOTAL KNEE REVISION;  Surgeon: Paralee Cancel, MD;  Location: [facility];  Service: Orthopedics;  Laterality: Right;  . venous ablation right thigh      2009     Current Meds  Medication Sig  . acetaminophen (TYLENOL) 500 MG tablet Take 1,000 mg by mouth every 6 (six) hours as needed for mild pain.  Marland Kitchen acidophilus (RISAQUAD) CAPS capsule Take 1 capsule by mouth as needed (for stomach).  Marland Kitchen aspirin EC 81 MG tablet Take 81 mg by mouth daily.  Marland Kitchen atorvastatin (LIPITOR) 10 MG tablet Take 10 mg by mouth daily at 6 PM.   . ibuprofen (ADVIL,MOTRIN) 800 MG tablet Take 800 mg by mouth every 8 (eight) hours as needed.  . Melatonin 10 MG TABS Take 1 tablet by mouth at bedtime.  . metoprolol succinate (TOPROL-XL) 25 MG 24 hr tablet Take 1 tablet by mouth daily.  . Multiple Vitamins-Minerals (CENTRUM ADULTS PO) Take 1 tablet by mouth daily.   . Multiple Vitamins-Minerals (ICAPS AREDS 2 PO) Take 1 tablet by mouth 2 (two) times daily.  Marland Kitchen omeprazole (PRILOSEC) 20 MG capsule Take 1 capsule by mouth daily.  . traZODone (DESYREL) 50 MG tablet Take 1-2 tablets by mouth at bedtime as needed.  . zolpidem (AMBIEN) 10 MG tablet Take 10 mg by mouth at bedtime as needed for sleep.   Current Facility-Administered Medications for the 01/20/19 encounter (Telemedicine) with Minus Breeding, MD  Medication  . 0.9 %  sodium chloride infusion  . 0.9 %  sodium chloride infusion     Allergies:   Percocet [oxycodone-acetaminophen]   Social History   Tobacco Use  . Smoking status: Former Smoker    Packs/day: 1.00    Years: 9.00    Pack years: 9.00    Types: Cigarettes    Last attempt to quit: 10/15/1980    Years since quitting: 38.2  . Smokeless tobacco: Former Systems developer    Types: [place] date: 10/16/2007  Substance Use Topics  . Alcohol use: Yes    Alcohol/week: 12.0 standard drinks    Types: 12 Cans of beer per week    Comment: 12 beers per week per pt  . Drug use: No      Family Hx: The patient's family history includes Colon cancer (age of onset: 79) in his maternal grandmother; Hypertension in his mother; Leukemia in his father.  ROS:   Please see the history of present illness.    As stated in the HPI and negative for  all other systems.   Prior CV studies:   The following studies were reviewed today:  Lexiscan Myoview from 2018  Labs/Other Tests and Data Reviewed:    EKG:  No ECG reviewed.  Recent Labs: No results found for requested labs within last 8760 hours.   Recent Lipid Panel Lab Results  Component Value Date/Time   CHOL 127 05/28/2017 04:17 AM   TRIG 38 05/28/2017 04:17 AM   HDL 48 05/28/2017 04:17 AM   CHOLHDL 2.6 05/28/2017 04:17 AM   LDLCALC 71 05/28/2017 04:17 AM    Wt Readings from Last 3 Encounters:  01/20/19 252 lb (114.3 kg)  08/14/18 270 lb (122.5 kg)  02/18/18 269 lb (122 kg)     Objective:    Vital Signs:  BP 137/82   Pulse 70   Ht 6\' 2"  (1.88 m)   Wt 252 lb (114.3 kg)   BMI 32.35 kg/m      ASSESSMENT & PLAN:    CHEST PAIN:    His symptoms have some typical and other atypical features.  I did increase his metoprolol to 50 mg daily.  And to give him sublingual nitroglycerin.  He is somewhat reassuring that he has had these similar symptoms with a negative work-up in the past and that he is able to do walking without bringing on any of the symptoms.  However, because it would be very difficult to sort these out without further testing he is instructed to call 911 if he has significant symptoms like he did last weekend.  I would then suggest he be admitted to the hospital for cardiac catheterization is the most definitive way to evaluate this discomfort for a cardiac source.  He is comfortable with these instructions.    COVID-19 Education: The signs and symptoms of COVID-19 were discussed with the patient and how to seek care for testing (follow up with PCP or arrange E-visit).  The importance of social  distancing was discussed today.  Time:   Today, I have spent 25 minutes with the patient with telehealth technology discussing the above problems.     Medication Adjustments/Labs and Tests Ordered: Current medicines are reviewed at length with the patient today.  Concerns regarding medicines are outlined above.  Tests Ordered: No orders of the defined types were placed in this encounter.  Medication Changes: No orders of the defined types were placed in this encounter.   Disposition:  Follow up six months  Signed, Minus Breeding, MD  01/20/2019 2:01 PM    [facility]

## 2019-01-20 NOTE — Patient Instructions (Signed)
Medication Instructions:  INCREASE- Metoprolol Succinate 50 mg daily  If you need a refill on your cardiac medications before your next appointment, please call your pharmacy.  Labwork: None Ordered   Testing/Procedures: None Ordered   Follow-Up: . Your physician recommends that you schedule a follow-up appointment in: 2 Months  At [facility], you and your health needs are our priority.  As part of our continuing mission to provide you with exceptional heart care, we have created designated Provider Care Teams.  These Care Teams include your primary Cardiologist (physician) and Advanced Practice Providers (APPs -  Physician Assistants and Nurse Practitioners) who all work together to provide you with the care you need, when you need it.  Thank you for choosing [facility] at [facility]!!

## 2019-02-22 ENCOUNTER — Encounter (HOSPITAL_COMMUNITY): Payer: Self-pay | Admitting: Student

## 2019-02-22 ENCOUNTER — Other Ambulatory Visit: Payer: Self-pay

## 2019-02-22 ENCOUNTER — Emergency Department (HOSPITAL_COMMUNITY): Payer: Medicare Other

## 2019-02-22 ENCOUNTER — Emergency Department (HOSPITAL_COMMUNITY)
Admission: EM | Admit: 2019-02-22 | Discharge: 2019-02-22 | Disposition: A | Discharge status: Home / Self Care | Payer: Medicare Other | Attending: Emergency Medicine | Admitting: Emergency Medicine

## 2019-02-22 DIAGNOSIS — R42 Dizziness and giddiness: Secondary | ICD-10-CM | POA: Insufficient documentation

## 2019-02-22 DIAGNOSIS — Z1159 Encounter for screening for other viral diseases: Secondary | ICD-10-CM | POA: Diagnosis not present

## 2019-02-22 DIAGNOSIS — Z87891 Personal history of nicotine dependence: Secondary | ICD-10-CM | POA: Diagnosis not present

## 2019-02-22 DIAGNOSIS — R072 Precordial pain: Secondary | ICD-10-CM | POA: Diagnosis present

## 2019-02-22 DIAGNOSIS — Z96651 Presence of right artificial knee joint: Secondary | ICD-10-CM | POA: Diagnosis not present

## 2019-02-22 DIAGNOSIS — Z85810 Personal history of malignant neoplasm of tongue: Secondary | ICD-10-CM | POA: Insufficient documentation

## 2019-02-22 DIAGNOSIS — R61 Generalized hyperhidrosis: Secondary | ICD-10-CM | POA: Insufficient documentation

## 2019-02-22 DIAGNOSIS — Z79899 Other long term (current) drug therapy: Secondary | ICD-10-CM | POA: Diagnosis not present

## 2019-02-22 DIAGNOSIS — R0602 Shortness of breath: Secondary | ICD-10-CM | POA: Insufficient documentation

## 2019-02-22 DIAGNOSIS — Z7982 Long term (current) use of aspirin: Secondary | ICD-10-CM | POA: Diagnosis not present

## 2019-02-22 DIAGNOSIS — R079 Chest pain, unspecified: Secondary | ICD-10-CM

## 2019-02-22 LAB — CBC
HCT: 44.3 % (ref 39.0–52.0)
Hemoglobin: 15.1 g/dL (ref 13.0–17.0)
MCH: 32.9 pg (ref 26.0–34.0)
MCHC: 34.1 g/dL (ref 30.0–36.0)
MCV: 96.5 fL (ref 80.0–100.0)
Platelets: 166 10*3/uL (ref 150–400)
RBC: 4.59 MIL/uL (ref 4.22–5.81)
RDW: 12.2 % (ref 11.5–15.5)
WBC: 6.6 10*3/uL (ref 4.0–10.5)
nRBC: 0 % (ref 0.0–0.2)

## 2019-02-22 LAB — BASIC METABOLIC PANEL
Anion gap: 13 (ref 5–15)
BUN: 14 mg/dL (ref 8–23)
CO2: 21 mmol/L — ABNORMAL LOW (ref 22–32)
Calcium: 9.6 mg/dL (ref 8.9–10.3)
Chloride: 107 mmol/L (ref 98–111)
Creatinine, Ser: 0.87 mg/dL (ref 0.61–1.24)
GFR calc Af Amer: >60 mL/min (ref >60)
GFR calc non Af Amer: >60 mL/min (ref >60)
Glucose, Bld: 87 mg/dL (ref 70–99)
Potassium: 3.9 mmol/L (ref 3.5–5.1)
Sodium: 141 mmol/L (ref 135–145)

## 2019-02-22 LAB — SARS CORONAVIRUS 2 BY RT PCR (HOSPITAL ORDER, PERFORMED IN ~~LOC~~ HOSPITAL LAB): SARS Coronavirus 2: NEGATIVE

## 2019-02-22 LAB — TROPONIN I
Troponin I: <0.03 ng/mL (ref <0.03)
Troponin I: <0.03 ng/mL (ref <0.03)

## 2019-02-22 MED ORDER — ASPIRIN 81 MG PO CHEW
243.0000 mg | CHEWABLE_TABLET | Freq: Once | ORAL | Status: AC
  Administered 2019-02-22: 18:00:00 243 mg via ORAL
  Filled 2019-02-22: qty 3

## 2019-02-22 NOTE — ED Triage Notes (Signed)
Pt reports CP x about 1 month but has worsened in the last week, followed up with cardiologist who told him to come to ED if having CP. Reports mild SOB and feeling dizzy. Denies fever or sick contacts.

## 2019-02-22 NOTE — ED Provider Notes (Signed)
[facility] EMERG[facility] DE[facility]MENT Provider Note   CSN: [id number] Arrival date & time: 02/22/19  1654    History   Chief Complaint Chief Complaint  Patient presents with  . Chest Pain    HPI Allen Robertson is a 68 y.o. 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 Patient states he has had intermittent chest pain described as substernal tightness associated w/ lightheadedness, dyspnea, & at times diaphoresis. He states this initially was occurring a few times per week, but over the past 1 week has been occurring daily, lasting anywhere from 30 minutes to 3 hours- most recently began 2.5 hours prior to ER arrival today while finishing a round of gold (utilized cart did not walk the course). Took 81 mg of aspirin PTA which has helped, states mild discomfort now is a 3/10 in severity. His sxs do not seem to have a trigger, not specific to exertion/eating/deep breathing. Other than ASA no alleviating/aggravating factors. He walks 4-5 miles daily without this pain or exercise limitation. Denies fever, chills, cough, nausea, vomiting, syncope, leg pain/swelling, hemoptysis, recent surgery/trauma, recent long travel, hormone use,  or hx of DVT/PE.  Hx of cancer in remission for 7 years.    Per chart review:  Telemedicine visit w/ cardiologist Dr. Percival Spanish 01/20/19 for chest pain. Prior cardiac work-up includes Negative stress test in 2018- There was a questionably low ejection fraction on that stress test but echocardiogram demonstrated a normal ejection fraction. During telemedicine visit increased metoprolol. Plan per note states that if patient continued to have pain episodes would suggest cardiac catheterization as the most definitive way to evaluate this discomfort for a cardiac source.  HPI  Past Medical  History:  Diagnosis Date  . Cancer ([place]) 2013   tongue  . Fracture of left clavicle    2000 / also had infection had to have IV infusion therapy   . GERD (gastroesophageal reflux disease)   . History of chicken pox   . History of kidney stones   . Hyperlipidemia   . Recurrent falls   . Staph skin infection   . Tinnitus   . Varicose veins     Patient Active Problem List   Diagnosis Date Noted  . Lumbar degenerative disc disease 11/26/2017  . Greater trochanteric bursitis, left 10/29/2017  . Chest pain 05/28/2017  . Hyperlipidemia 05/28/2017  . GERD (gastroesophageal reflux disease) 05/28/2017  . Abnormal EKG 05/28/2017  . Adiposity 09/13/2015  . 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 History of knee surgery 09/12/2015  . History of artificial joint 09/12/2015  . Foreign body (FB) in soft tissue 07/08/2014  . Lesion of soft tissue 07/08/2014  . Joint pain 04/22/2014  . Soft tissue disorder 04/22/2014  . Groin injury 03/31/2013  . Pubic bone pain 03/31/2013  . History of arthroplasty 04/11/2012  . 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 Cancer of tongue ([place]) 11/23/2011  . Tongue mass 11/07/2011  . Snoring 10/19/2011  . Varicose veins 10/19/2011    Past Surgical History:  Procedure Laterality Date  . BRISTOW PROCEDURE     left shoulder   . I&D EXTREMITY     right lower leg   . JOINT REPLACEMENT    . LEG SURGERY Right   . LITHOTRIPSY    . PICC  LINE PLACE PERIPHERAL ([place] [facility]X)     history of / secondary to right lower leg infection   . SHOULDER ARTHROSCOPY Left   . TONGUE SURGERY     robotic assisted with bilateral neck dissection 11/2011  . TOTAL KNEE ARTHROPLASTY Right    partial  . TOTAL KNEE REVISION Right 09/12/2015   Procedure: RIGHT TOTAL KNEE REVISION;  Surgeon: Paralee Cancel, MD;  Location: [facility];  Service: Orthopedics;  Laterality: Right;  . venous ablation right thigh      2009        Home Medications    Prior to  Admission medications   Medication Sig Start Date End Date Taking? Authorizing Provider  acetaminophen (TYLENOL) 500 MG tablet Take 1,000 mg by mouth every 6 (six) hours as needed for mild pain.    [provider]  acidophilus (RISAQUAD) CAPS capsule Take 1 capsule by mouth as needed (for stomach).    [provider]  aspirin EC 81 MG tablet Take 81 mg by mouth daily.    [provider]  atorvastatin (LIPITOR) 10 MG tablet Take 10 mg by mouth daily at 6 PM.     [provider]  ibuprofen (ADVIL,MOTRIN) 800 MG tablet Take 800 mg by mouth every 8 (eight) hours as needed.    [provider]  Melatonin 10 MG TABS Take 1 tablet by mouth at bedtime.    [provider]  metoprolol succinate (TOPROL-XL) 50 MG 24 hr tablet Take 1 tablet (50 mg total) by mouth daily. 01/20/19   Minus Breeding, MD  Multiple Vitamins-Minerals (CENTRUM ADULTS PO) Take 1 tablet by mouth daily.     [provider]  Multiple Vitamins-Minerals (ICAPS AREDS 2 PO) Take 1 tablet by mouth 2 (two) times daily.    [provider]  nitroGLYCERIN (NITROSTAT) 0.4 MG SL tablet Place 1 tablet (0.4 mg total) under the tongue every 5 (five) minutes as needed for chest pain. 01/20/19 04/20/19  Minus Breeding, MD  omeprazole (PRILOSEC) 20 MG capsule Take 1 capsule by mouth daily. 12/23/18   [provider]  traZODone (DESYREL) 50 MG tablet Take 1-2 tablets by mouth at bedtime as needed. 01/06/19   [provider]  zolpidem (AMBIEN) 10 MG tablet Take 10 mg by mouth at bedtime as needed for sleep.    [provider]    Family History Family History  Problem Relation Age of Onset  . Hypertension Mother   . Leukemia Father   . Colon cancer Maternal Grandmother 68    Social History Social History   Tobacco Use  . Smoking status: Former Smoker    Packs/day: 1.00    Years: 9.00    Pack years: 9.00    Types: Cigarettes    Last attempt to quit:  10/15/1980    Years since quitting: 38.3  . Smokeless tobacco: Former Systems developer    Types: [place] date: 10/16/2007  Substance Use Topics  . Alcohol use: Yes    Alcohol/week: 12.0 standard drinks    Types: 12 Cans of beer per week    Comment: 12 beers per week per pt  . Drug use: No     Allergies   Percocet [oxycodone-acetaminophen]   Review of Systems Review of Systems  Constitutional: Positive for diaphoresis. Negative for chills and fever.  Respiratory: Positive for shortness of breath. Negative for cough.        Negative for hemoptysis  Cardiovascular: Positive for chest pain. Negative  for palpitations and leg swelling.  Gastrointestinal: Negative for abdominal pain, nausea and vomiting.  Neurological: Positive for light-headedness. Negative for syncope and weakness.  All other systems reviewed and are negative.  Physical Exam Updated Vital Signs There were no vitals taken for this visit.  Physical Exam Vitals signs and nursing note reviewed.  Constitutional:      General: He is not in acute distress.    Appearance: He is well-developed. He is not toxic-appearing.  HENT:     Head: Normocephalic and atraumatic.  Eyes:     General:        Right eye: No discharge.        Left eye: No discharge.     Conjunctiva/sclera: Conjunctivae normal.  Neck:     Musculoskeletal: Neck supple.  Cardiovascular:     Rate and Rhythm: Normal rate and regular rhythm.     Pulses:          Radial pulses are 2+ on the right side and 2+ on the left side.  Pulmonary:     Effort: Pulmonary effort is normal. No respiratory distress.     Breath sounds: Normal breath sounds. No wheezing, rhonchi or rales.  Abdominal:     General: There is no distension.     Palpations: Abdomen is soft.     Tenderness: There is no abdominal tenderness. There is no guarding or rebound.  Musculoskeletal:        General: No tenderness.     Right lower leg: No edema.     Left lower leg: No edema.  Skin:     General: Skin is warm and dry.     Findings: No rash.  Neurological:     Mental Status: He is alert.     Comments: Clear speech.   Psychiatric:        Behavior: Behavior normal.    ED Treatments / Results  Labs (all labs ordered are listed, but only abnormal results are displayed) Labs Reviewed - No data to display  EKG None  Radiology No results found.  Procedures Procedures (including critical care time)  Medications Ordered in ED Medications - No data to display   Initial Impression / Assessment and Plan / ED Course  I have reviewed the triage vital signs and the nursing notes.  Pertinent labs & imaging results that were available during my care of the patient were reviewed by me and considered in my medical decision making (see chart for details).    Patient presents to the emergency department with chest pain. Patient nontoxic appearing, in no apparent distress, vitals without significant abnormality. Fairly benign physical exam. DDX: ACS, pulmonary embolism, dissection, pneumothorax, effusion, infiltrate, arrhythmia, anemia, electrolyte derangement, MSK, GERD, anxiety. Evaluation initiated with labs, EKG, and CXR. Patient on cardiac monitor. Will give additional aspirin for full 324 mg.   Work-up in the ER reviewed:  CBC: No anemia/leukocytosis BMP: No significant electrolyte derrangement.  Troponin: WNL EKG: No significant change from prior.  CXR:  Negative, without infiltrate, effusion, pneumothorax, or fracture/dislocation.   Patient is low risk wells, PERC negative, doubt pulmonary embolism. Pain is not a tearing sensation, symmetric pulses, no widening of mediastinum on CXR, doubt dissection. Cardiac monitor reviewed, no notable arrhythmias or tachycardia. His EKG is without significant change from prior- first trop negative- will discuss w/ cardiology.   20:50: CONSULT: Discussed w/ Dr. Hassell Done w/ cardiology- given patient initial troponin negative, EKG without  concerning changes, & patient w/ atypical chest pain recommends  delta troponin, she states can discharge home office will call tomorrow AM to set up close follow up w/ outpatient catheterization. States if patient is uncomfortable w/ this plan or if delta trop elevates- happy to admit patient.  Discussed w/ patient, would prefer delta troponin & discharge if WNL. Troponin ordered.   Care signed out to [facility] pending repeat troponin. If negative discharge home.    Final Clinical Impressions(s) / ED Diagnoses   Final diagnoses:  Chest pain, unspecified type    ED Discharge Orders    None       Amaryllis Dyke, PA-C 02/22/19 2135    Pattricia Boss, MD 02/23/19 1734

## 2019-02-22 NOTE — ED Notes (Signed)
Discharge instructions discussed with Pt. Pt verbalized understanding. Pt stable and ambulatory.    

## 2019-02-22 NOTE — Discharge Instructions (Addendum)
You were seen in the emergency department today for chest pain. Your work-up in the emergency department has been overall reassuring. Your labs have been fairly normal and or similar to previous blood work you have had done. Your EKG and the enzyme we use to check your heart did not show an acute heart attack at this time. Your chest x-ray was normal.   Your cardiologist's office will call you tomorrow morning in order to discuss scheduling heart catheterization- if you do not hear from them by afternoon please call the office.. Return to the ER immediately should you experience any new or worsening symptoms including but not limited to return of pain, worsened pain, vomiting, shortness of breath, dizziness, lightheadedness, passing out, or any other concerns that you may have.

## 2019-02-22 NOTE — ED Provider Notes (Signed)
Care assumed from previous provider PA [place]. Please see note for further details. Case discussed, plan agreed upon. Will follow up on pending delta troponin. If negative, discharge to home with cardiology follow up.   Troponin negative. Will discharge home with cards follow up discussed by previous provider. Reasons to return to ER were discussed. All questions answered.    Marigold Mom, Ozella Almond, PA-C 02/22/19 2154    Tegeler, Gwenyth Allegra, MD 02/23/19 [phone]

## 2019-02-23 ENCOUNTER — Telehealth: Payer: Self-pay | Admitting: Cardiology

## 2019-02-23 ENCOUNTER — Telehealth: Payer: Self-pay

## 2019-02-23 NOTE — Telephone Encounter (Signed)
-----   Message from Lonn Georgia, PA-C sent at 02/23/2019  6:22 AM EDT ----- Pt of [place]. Was in ER over the weekend w/ CP. Can you get him a visit today or tomorrow? See if he is comfortable w/ virtual visit.  Thanks

## 2019-02-23 NOTE — Telephone Encounter (Signed)
Spoke to pt earlier today. He said he can do video visit with smart phone. Pt needs appt today or tomorrow. I have asked Isaac Laud if we can add him on to his schedule today and waiting for response. If not, pt can be added on to a schedule tomorrow. I informed pt that we will call him back with scheduled appointment time. Pt verbalized thanks.

## 2019-02-23 NOTE — Telephone Encounter (Signed)
Patient is scheduled for a telephone visit with Almyra Deforest, PA-C for 02/24/2019 at 8:30AM.

## 2019-02-23 NOTE — Telephone Encounter (Signed)
Virtual Visit Pre-Appointment Phone Call  "(Name), I am calling you today to discuss your upcoming appointment. We are currently trying to limit exposure to the virus that causes COVID-19 by seeing patients at home rather than in the office."  1. "What is the BEST phone number to call the day of the visit?" - include this in appointment notes  2. "Do you have or have access to (through a family member/friend) a smartphone with video capability that we can use for your visit?" a. If yes - list this number in appt notes as "cell" (if different from BEST phone #) and list the appointment type as a VIDEO visit in appointment notes b. If no - list the appointment type as a PHONE visit in appointment notes  3. Confirm consent - "In the setting of the current Covid19 crisis, you are scheduled for a PHONE visit with your provider on 02/24/2019 at 8:30AM.  Just as we do with many in-office visits, in order for you to participate in this visit, we must obtain consent.  If you'd like, I can send this to your mychart (if signed up) or email for you to review.  Otherwise, I can obtain your verbal consent now.  All virtual visits are billed to your insurance company just like a normal visit would be.  By agreeing to a virtual visit, we'd like you to understand that the technology does not allow for your provider to perform an examination, and thus may limit your provider's ability to fully assess your condition. If your provider identifies any concerns that need to be evaluated in person, we will make arrangements to do so.  Finally, though the technology is pretty good, we cannot assure that it will always work on either your or our end, and in the setting of a video visit, we may have to convert it to a phone-only visit.  In either situation, we cannot ensure that we have a secure connection.  Are you willing to proceed?" STAFF: Did the patient verbally acknowledge consent to telehealth visit? Document YES/NO  here: YES  4. Advise patient to be prepared - "Two hours prior to your appointment, go ahead and check your blood pressure, pulse, oxygen saturation, and your weight (if you have the equipment to check those) and write them all down. When your visit starts, your provider will ask you for this information. If you have an Apple Watch or Kardia device, please plan to have heart rate information ready on the day of your appointment. Please have a pen and paper handy nearby the day of the visit as well."  5. Give patient instructions for MyChart download to smartphone OR Doximity/Doxy.me as below if video visit (depending on what platform provider is using)  6. Inform patient they will receive a phone call 15 minutes prior to their appointment time (may be from unknown caller ID) so they should be prepared to answer    TELEPHONE CALL NOTE  Allen Robertson has been deemed a candidate for a follow-up tele-health visit to limit community exposure during the Covid-19 pandemic. I spoke with the patient via phone to ensure availability of phone/video source, confirm preferred email & phone number, and discuss instructions and expectations.  I reminded Allen Robertson to be prepared with any vital sign and/or heart rhythm information that could potentially be obtained via home monitoring, at the time of his visit. I reminded Allen Robertson to expect a phone call prior to his visit.  Allen Robertson, [place] 02/23/2019 2:09 PM   INSTRUCTIONS FOR DOWNLOADING THE MYCHART APP TO SMARTPHONE  - The patient must first make sure to have activated MyChart and know their login information - If Apple, go to [employer] and type in MyChart in the search bar and download the app. If Android, ask patient to go to [employer] and type in [place] in the search bar and download the app. The app is free but as with any other app downloads, their phone may require them to verify saved payment information or Apple/Android  password.  - The patient will need to then log into the app with their MyChart username and password, and select [facility] as their healthcare provider to link the account. When it is time for your visit, go to the MyChart app, find appointments, and click Begin Video Visit. Be sure to Select Allow for your device to access the Microphone and Camera for your visit. You will then be connected, and your provider will be with you shortly.  **If they have any issues connecting, or need assistance please contact MyChart service desk [phone])**  **If using a computer, in order to ensure the best quality for their visit they will need to use either of the following Internet Browsers: [employer], or Google Chrome**  IF USING DOXIMITY or DOXY.ME - The patient will receive a link just prior to their visit by text.     FULL LENGTH CONSENT FOR TELE-HEALTH VISIT   I hereby voluntarily request, consent and authorize [facility] and its employed or contracted physicians, physician assistants, nurse practitioners or other licensed health care professionals (the Practitioner), to provide me with telemedicine health care services (the "Services") as deemed necessary by the treating Practitioner. I acknowledge and consent to receive the Services by the Practitioner via telemedicine. I understand that the telemedicine visit will involve communicating with the Practitioner through live audiovisual communication technology and the disclosure of certain medical information by electronic transmission. I acknowledge that I have been given the opportunity to request an in-person assessment or other available alternative prior to the telemedicine visit and am voluntarily participating in the telemedicine visit.  I understand that I have the right to withhold or withdraw my consent to the use of telemedicine in the course of my care at any time, without affecting my right to future care or treatment,  and that the Practitioner or I may terminate the telemedicine visit at any time. I understand that I have the right to inspect all information obtained and/or recorded in the course of the telemedicine visit and may receive copies of available information for a reasonable fee.  I understand that some of the potential risks of receiving the Services via telemedicine include:  Marland Kitchen Delay or interruption in medical evaluation due to technological equipment failure or disruption; . Information transmitted may not be sufficient (e.g. poor resolution of images) to allow for appropriate medical decision making by the Practitioner; and/or  . In rare instances, security protocols could fail, causing a breach of personal health information.  Furthermore, I acknowledge that it is my responsibility to provide information about my medical history, conditions and care that is complete and accurate to the best of my ability. I acknowledge that Practitioner's advice, recommendations, and/or decision may be based on factors not within their control, such as incomplete or inaccurate data provided by me or distortions of diagnostic images or specimens that may result from electronic transmissions. I understand that the  practice of medicine is not an Chief Strategy Officer and that Practitioner makes no warranties or guarantees regarding treatment outcomes. I acknowledge that I will receive a copy of this consent concurrently upon execution via email to the email address I last provided but may also request a printed copy by calling the office of [facility].    I understand that my insurance will be billed for this visit.   I have read or had this consent read to me. . I understand the contents of this consent, which adequately explains the benefits and risks of the Services being provided via telemedicine.  . I have been provided ample opportunity to ask questions regarding this consent and the Services and have had my questions  answered to my satisfaction. . I give my informed consent for the services to be provided through the use of telemedicine in my medical care  By participating in this telemedicine visit I agree to the above.

## 2019-02-24 ENCOUNTER — Telehealth (INDEPENDENT_AMBULATORY_CARE_PROVIDER_SITE_OTHER): Payer: Medicare Other | Admitting: Physician Assistant

## 2019-02-24 VITALS — BP 140/82 | HR 65 | Ht 74.0 in | Wt 248.0 lb

## 2019-02-24 DIAGNOSIS — R0789 Other chest pain: Secondary | ICD-10-CM | POA: Diagnosis not present

## 2019-02-24 DIAGNOSIS — E785 Hyperlipidemia, unspecified: Secondary | ICD-10-CM

## 2019-02-24 DIAGNOSIS — R079 Chest pain, unspecified: Secondary | ICD-10-CM

## 2019-02-24 NOTE — H&P (View-Only) (Signed)
Virtual Visit via Video Note   This visit type was conducted due to national recommendations for restrictions regarding the COVID-19 Pandemic (e.g. social distancing) in an effort to limit this patient's exposure and mitigate transmission in our community.  Due to his co-morbid illnesses, this patient is at least at moderate risk for complications without adequate follow up.  This format is felt to be most appropriate for this patient at this time.  All issues noted in this document were discussed and addressed.  A limited physical exam was performed with this format.  Please refer to the patient's chart for his consent to telehealth for [facility].   Date:  02/26/2019   ID:  Allen Robertson, DOB Mar 27, 1951, MRN [id number]  Patient Location: Home Provider Location: Home  PCP:  Ivan Anchors, MD  Cardiologist:  Minus Breeding, MD  Electrophysiologist:  None   Evaluation Performed:  Follow-Up Visit  Chief Complaint:  Chest pain, recent ED visit  History of Present Illness:    Allen Robertson is a 68 y.o. male with hyperlipidemia and a history of chest discomfort.  He had a negative stress test in 2018 with questionable low ejection fraction however follow-up echocardiogram demonstrated normal EF.  He reportedly later had a repeat echocardiogram at [facility] as well.  He was most recently contacted through telemedicine by Dr. Percival Spanish on 01/20/2019 at which time he had some atypical chest pain.  His metoprolol was increased to 50 mg daily and sublingual nitroglycerin was prescribed.  Unfortunately, patient presented to the ED on 02/22/2019 with chest pain.  EKG showed no ischemic changes, troponin negative x2.  The case was discussed between the on-call cardiology fellow and the ED physician, it was felt given the atypical nature of the chest pain and negative troponin, it is okay to discharge the patient and have the patient follow-up in the clinic as outpatient to consider outpatient cath.   COVID-19 test in the emergency room was negative.  He says his chest discomfort has been increasing in frequency and duration for the past month and a half.  Each episode of chest pain lasted from 30 minutes to over an hour.  Interestingly, he tends to notice majority of the chest pain at night when he is not doing anything.  He is unclear if there is a true trigger with exertion.  The chest discomfort is located substernally and accompanied by occasional dizziness and shortness of breath as well.   I discussed the case with DOD Dr. Oval Linsey, given increasing episodes of chest discomfort, our recommendation is to proceed with cardiac catheterization.  Risk and the benefit of the procedure has been discussed with the patient who is agreeable to proceed.   The patient does not have symptoms concerning for COVID-19 infection (fever, chills, cough, or new shortness of breath).    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   Fracture of left clavicle    2000 / also had infection had to have IV infusion therapy    GERD (gastroesophageal reflux disease)    History of chicken pox    History of kidney stones    Hyperlipidemia    Recurrent falls    Staph skin infection    Tinnitus    Varicose veins    Past Surgical History:  Procedure Laterality Date   BRISTOW PROCEDURE     left shoulder    I&D EXTREMITY     right lower leg  JOINT REPLACEMENT     LEG SURGERY Right    LITHOTRIPSY     PICC LINE PLACE PERIPHERAL ([place] [facility]X)     history of / secondary to right lower leg infection    SHOULDER ARTHROSCOPY Left    TONGUE SURGERY     robotic assisted with bilateral neck dissection 11/2011   TOTAL KNEE ARTHROPLASTY Right    partial   TOTAL KNEE REVISION Right 09/12/2015   Procedure: RIGHT TOTAL KNEE REVISION;  Surgeon: Paralee Cancel, MD;  Location: [facility];  Service: Orthopedics;  Laterality: Right;   venous ablation right thigh      2009      Current Meds  Medication Sig   acetaminophen (TYLENOL) 500 MG tablet Take 1,000 mg by mouth every 6 (six) hours as needed for mild pain.   acidophilus (RISAQUAD) CAPS capsule Take 1 capsule by mouth 2 (two) times a week.    aspirin EC 81 MG tablet Take 81 mg by mouth daily.   atorvastatin (LIPITOR) 10 MG tablet Take 10 mg by mouth daily at 6 PM.    ibuprofen (ADVIL,MOTRIN) 800 MG tablet Take 800 mg by mouth every 8 (eight) hours as needed for moderate pain.    metoprolol succinate (TOPROL-XL) 50 MG 24 hr tablet Take 1 tablet (50 mg total) by mouth daily.   Multiple Vitamins-Minerals (CENTRUM ADULTS PO) Take 1 tablet by mouth daily.    Multiple Vitamins-Minerals (ICAPS AREDS 2 PO) Take 1 tablet by mouth 2 (two) times daily.   nitroGLYCERIN (NITROSTAT) 0.4 MG SL tablet Place 1 tablet (0.4 mg total) under the tongue every 5 (five) minutes as needed for chest pain.   omeprazole (PRILOSEC) 20 MG capsule Take 20 mg by mouth daily.    zolpidem (AMBIEN) 10 MG tablet Take 10 mg by mouth at bedtime.    [DISCONTINUED] Melatonin 10 MG TABS Take 1 tablet by mouth at bedtime.   [DISCONTINUED] traZODone (DESYREL) 50 MG tablet Take 1-2 tablets by mouth at bedtime as needed.   Current Facility-Administered Medications for the 02/24/19 encounter (Telemedicine) with Almyra Deforest, PA  Medication   0.9 %  sodium chloride infusion   0.9 %  sodium chloride infusion     Allergies:   Percocet [oxycodone-acetaminophen]   Social History   Tobacco Use   Smoking status: Former Smoker    Packs/day: 1.00    Years: 9.00    Pack years: 9.00    Types: Cigarettes    Last attempt to quit: 10/15/1980    Years since quitting: 38.3   Smokeless tobacco: Former Systems developer    Types: Chew    Quit date: 10/16/2007  Substance Use Topics   Alcohol use: Yes    Alcohol/week: 12.0 standard drinks    Types: 12 Cans of beer per week    Comment: socially   Drug use: No     Family Hx: The patient's family history  includes Colon cancer (age of onset: 21) in his maternal grandmother; Hypertension in his mother; Leukemia in his father.  ROS:   Please see the history of present illness.     All other systems reviewed and are negative.   Prior CV studies:   The following studies were reviewed today:  Echo 05/28/2017 LV EF: 60% -   65% Study Conclusions  - Left ventricle: The cavity size was normal. Systolic function was   normal. The estimated ejection fraction was in the range of 60%   to 65%. Wall motion was normal; there  were no regional wall   motion abnormalities. Left ventricular diastolic function   parameters were normal.  Labs/Other Tests and Data Reviewed:    EKG:  An ECG dated 02/22/2019 was personally reviewed today and demonstrated:  Normal sinus rhythm without significant ST-T wave changes  Recent Labs: 02/22/2019: BUN 14; Creatinine, Ser 0.87; Hemoglobin 15.1; Platelets 166; Potassium 3.9; Sodium 141   Recent Lipid Panel Lab Results  Component Value Date/Time   CHOL 127 05/28/2017 04:17 AM   TRIG 38 05/28/2017 04:17 AM   HDL 48 05/28/2017 04:17 AM   CHOLHDL 2.6 05/28/2017 04:17 AM   LDLCALC 71 05/28/2017 04:17 AM    Wt Readings from Last 3 Encounters:  02/24/19 248 lb (112.5 kg)  01/20/19 252 lb (114.3 kg)  08/14/18 270 lb (122.5 kg)     Objective:    Vital Signs:  BP 140/82    Pulse 65    Ht 6\' 2"  (1.88 m)    Wt 248 lb (112.5 kg)    BMI 31.84 kg/m    VITAL SIGNS:  reviewed  ASSESSMENT & PLAN:    1. Progressive chest pain: Patient had a negative stress test in 2018.  He was recently seen by Dr. Percival Spanish for chest pain, metoprolol was increased and a sublingual nitroglycerin has been given to the patient.  The plan is to consider cardiac catheterization if his symptoms persist for definitive evaluation even though some of his symptom is atypical.  He was seen in the emergency room 2 days ago and added onto my schedule for outpatient follow-up.  EKG and the troponin  was negative in the emergency room.  ED physician discussed the case with on-call cardiology fellow who suggested outpatient evaluation prior to setting up outpatient cardiac catheterization.   - I discussed the case with Dr. Oval Linsey DOD, given persistently worsening chest pain with increasing frequency and the duration, we plan to proceed with cardiac catheterization for definitive evaluation -Risk and benefit of procedure explained to the patient who display clear understanding and agree to proceed.  Discussed with patient possible procedural risk include bleeding, vascular injury, renal injury, arrythmia, MI, stroke and loss of limb or life.   2. Hyperlipidemia: On Lipitor 10 mg daily.  COVID-19 Education: The signs and symptoms of COVID-19 were discussed with the patient and how to seek care for testing (follow up with PCP or arrange E-visit).  The importance of social distancing was discussed today.  Time:   Today, I have spent 8 min video +14 min telephone with the patient with telehealth technology discussing the above problems.     Medication Adjustments/Labs and Tests Ordered: Current medicines are reviewed at length with the patient today.  Concerns regarding medicines are outlined above.   Tests Ordered: No orders of the defined types were placed in this encounter.   Medication Changes: No orders of the defined types were placed in this encounter.   Disposition:  Follow up in 3 week(s)  Signed, Almyra Deforest, PA  02/26/2019 8:29 AM    [facility]

## 2019-02-24 NOTE — Progress Notes (Signed)
Virtual Visit via Video Note   This visit type was conducted due to national recommendations for restrictions regarding the COVID-19 Pandemic (e.g. social distancing) in an effort to limit this patient's exposure and mitigate transmission in our community.  Due to his co-morbid illnesses, this patient is at least at moderate risk for complications without adequate follow up.  This format is felt to be most appropriate for this patient at this time.  All issues noted in this document were discussed and addressed.  A limited physical exam was performed with this format.  Please refer to the patient's chart for his consent to telehealth for [facility].   Date:  02/26/2019   ID:  Allen Robertson, DOB 09-24-1951, MRN [id number]  Patient Location: Home Provider Location: Home  PCP:  Ivan Anchors, MD  Cardiologist:  Minus Breeding, MD  Electrophysiologist:  None   Evaluation Performed:  Follow-Up Visit  Chief Complaint:  Chest pain, recent ED visit  History of Present Illness:    Allen Robertson is a 68 y.o. male with hyperlipidemia and a history of chest discomfort.  He had a negative stress test in 2018 with questionable low ejection fraction however follow-up echocardiogram demonstrated normal EF.  He reportedly later had a repeat echocardiogram at [facility] as well.  He was most recently contacted through telemedicine by Dr. Percival Spanish on 01/20/2019 at which time he had some atypical chest pain.  His metoprolol was increased to 50 mg daily and sublingual nitroglycerin was prescribed.  Unfortunately, patient presented to the ED on 02/22/2019 with chest pain.  EKG showed no ischemic changes, troponin negative x2.  The case was discussed between the on-call cardiology fellow and the ED physician, it was felt given the atypical nature of the chest pain and negative troponin, it is okay to discharge the patient and have the patient follow-up in the clinic as outpatient to consider outpatient cath.   COVID-19 test in the emergency room was negative.  He says his chest discomfort has been increasing in frequency and duration for the past month and a half.  Each episode of chest pain lasted from 30 minutes to over an hour.  Interestingly, he tends to notice majority of the chest pain at night when he is not doing anything.  He is unclear if there is a true trigger with exertion.  The chest discomfort is located substernally and accompanied by occasional dizziness and shortness of breath as well.   I discussed the case with DOD Dr. Oval Linsey, given increasing episodes of chest discomfort, our recommendation is to proceed with cardiac catheterization.  Risk and the benefit of the procedure has been discussed with the patient who is agreeable to proceed.   The patient does not have symptoms concerning for COVID-19 infection (fever, chills, cough, or new shortness of breath).    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   Fracture of left clavicle    2000 / also had infection had to have IV infusion therapy    GERD (gastroesophageal reflux disease)    History of chicken pox    History of kidney stones    Hyperlipidemia    Recurrent falls    Staph skin infection    Tinnitus    Varicose veins    Past Surgical History:  Procedure Laterality Date   BRISTOW PROCEDURE     left shoulder    I&D EXTREMITY     right lower leg  JOINT REPLACEMENT     LEG SURGERY Right    LITHOTRIPSY     PICC LINE PLACE PERIPHERAL ([place] HX)     history of / secondary to right lower leg infection    SHOULDER ARTHROSCOPY Left    TONGUE SURGERY     robotic assisted with bilateral neck dissection 11/2011   TOTAL KNEE ARTHROPLASTY Right    partial   TOTAL KNEE REVISION Right 09/12/2015   Procedure: RIGHT TOTAL KNEE REVISION;  Surgeon: Paralee Cancel, MD;  Location: [facility];  Service: Orthopedics;  Laterality: Right;   venous ablation right thigh      2009      Current Meds  Medication Sig   acetaminophen (TYLENOL) 500 MG tablet Take 1,000 mg by mouth every 6 (six) hours as needed for mild pain.   acidophilus (RISAQUAD) CAPS capsule Take 1 capsule by mouth 2 (two) times a week.    aspirin EC 81 MG tablet Take 81 mg by mouth daily.   atorvastatin (LIPITOR) 10 MG tablet Take 10 mg by mouth daily at 6 PM.    ibuprofen (ADVIL,MOTRIN) 800 MG tablet Take 800 mg by mouth every 8 (eight) hours as needed for moderate pain.    metoprolol succinate (TOPROL-XL) 50 MG 24 hr tablet Take 1 tablet (50 mg total) by mouth daily.   Multiple Vitamins-Minerals (CENTRUM ADULTS PO) Take 1 tablet by mouth daily.    Multiple Vitamins-Minerals (ICAPS AREDS 2 PO) Take 1 tablet by mouth 2 (two) times daily.   nitroGLYCERIN (NITROSTAT) 0.4 MG SL tablet Place 1 tablet (0.4 mg total) under the tongue every 5 (five) minutes as needed for chest pain.   omeprazole (PRILOSEC) 20 MG capsule Take 20 mg by mouth daily.    zolpidem (AMBIEN) 10 MG tablet Take 10 mg by mouth at bedtime.    [DISCONTINUED] Melatonin 10 MG TABS Take 1 tablet by mouth at bedtime.   [DISCONTINUED] traZODone (DESYREL) 50 MG tablet Take 1-2 tablets by mouth at bedtime as needed.   Current Facility-Administered Medications for the 02/24/19 encounter (Telemedicine) with Almyra Deforest, PA  Medication   0.9 %  sodium chloride infusion   0.9 %  sodium chloride infusion     Allergies:   Percocet [oxycodone-acetaminophen]   Social History   Tobacco Use   Smoking status: Former Smoker    Packs/day: 1.00    Years: 9.00    Pack years: 9.00    Types: Cigarettes    Last attempt to quit: 10/15/1980    Years since quitting: 38.3   Smokeless tobacco: Former Systems developer    Types: Chew    Quit date: 10/16/2007  Substance Use Topics   Alcohol use: Yes    Alcohol/week: 12.0 standard drinks    Types: 12 Cans of beer per week    Comment: socially   Drug use: No     Family Hx: The patient's family history  includes Colon cancer (age of onset: 54) in his maternal grandmother; Hypertension in his mother; Leukemia in his father.  ROS:   Please see the history of present illness.     All other systems reviewed and are negative.   Prior CV studies:   The following studies were reviewed today:  Echo 05/28/2017 LV EF: 60% -   65% Study Conclusions  - Left ventricle: The cavity size was normal. Systolic function was   normal. The estimated ejection fraction was in the range of 60%   to 65%. Wall motion was normal; there  were no regional wall   motion abnormalities. Left ventricular diastolic function   parameters were normal.  Labs/Other Tests and Data Reviewed:    EKG:  An ECG dated 02/22/2019 was personally reviewed today and demonstrated:  Normal sinus rhythm without significant ST-T wave changes  Recent Labs: 02/22/2019: BUN 14; Creatinine, Ser 0.87; Hemoglobin 15.1; Platelets 166; Potassium 3.9; Sodium 141   Recent Lipid Panel Lab Results  Component Value Date/Time   CHOL 127 05/28/2017 04:17 AM   TRIG 38 05/28/2017 04:17 AM   HDL 48 05/28/2017 04:17 AM   CHOLHDL 2.6 05/28/2017 04:17 AM   LDLCALC 71 05/28/2017 04:17 AM    Wt Readings from Last 3 Encounters:  02/24/19 248 lb (112.5 kg)  01/20/19 252 lb (114.3 kg)  08/14/18 270 lb (122.5 kg)     Objective:    Vital Signs:  BP 140/82    Pulse 65    Ht 6\' 2"  (1.88 m)    Wt 248 lb (112.5 kg)    BMI 31.84 kg/m    VITAL SIGNS:  reviewed  ASSESSMENT & PLAN:    1. Progressive chest pain: Patient had a negative stress test in 2018.  He was recently seen by Dr. Percival Spanish for chest pain, metoprolol was increased and a sublingual nitroglycerin has been given to the patient.  The plan is to consider cardiac catheterization if his symptoms persist for definitive evaluation even though some of his symptom is atypical.  He was seen in the emergency room 2 days ago and added onto my schedule for outpatient follow-up.  EKG and the troponin  was negative in the emergency room.  ED physician discussed the case with on-call cardiology fellow who suggested outpatient evaluation prior to setting up outpatient cardiac catheterization.   - I discussed the case with Dr. Oval Linsey DOD, given persistently worsening chest pain with increasing frequency and the duration, we plan to proceed with cardiac catheterization for definitive evaluation -Risk and benefit of procedure explained to the patient who display clear understanding and agree to proceed.  Discussed with patient possible procedural risk include bleeding, vascular injury, renal injury, arrythmia, MI, stroke and loss of limb or life.   2. Hyperlipidemia: On Lipitor 10 mg daily.  COVID-19 Education: The signs and symptoms of COVID-19 were discussed with the patient and how to seek care for testing (follow up with PCP or arrange E-visit).  The importance of social distancing was discussed today.  Time:   Today, I have spent 8 min video +14 min telephone with the patient with telehealth technology discussing the above problems.     Medication Adjustments/Labs and Tests Ordered: Current medicines are reviewed at length with the patient today.  Concerns regarding medicines are outlined above.   Tests Ordered: No orders of the defined types were placed in this encounter.   Medication Changes: No orders of the defined types were placed in this encounter.   Disposition:  Follow up in 3 week(s)  Signed, Almyra Deforest, PA  02/26/2019 8:29 AM    [facility]

## 2019-02-25 ENCOUNTER — Other Ambulatory Visit (HOSPITAL_COMMUNITY)
Admission: RE | Admit: 2019-02-25 | Discharge: 2019-02-25 | Disposition: A | Discharge status: Home / Self Care | Payer: Medicare Other | Source: Ambulatory Visit | Attending: Cardiology | Admitting: Cardiology

## 2019-02-25 ENCOUNTER — Other Ambulatory Visit: Payer: Self-pay

## 2019-02-25 DIAGNOSIS — Z1159 Encounter for screening for other viral diseases: Secondary | ICD-10-CM | POA: Diagnosis present

## 2019-02-25 LAB — SARS CORONAVIRUS 2 BY RT PCR (HOSPITAL ORDER, PERFORMED IN ~~LOC~~ HOSPITAL LAB): SARS Coronavirus 2: NEGATIVE

## 2019-02-26 ENCOUNTER — Telehealth: Payer: Self-pay | Admitting: *Deleted

## 2019-02-26 ENCOUNTER — Other Ambulatory Visit: Payer: Self-pay | Admitting: Physician Assistant

## 2019-02-26 MED ORDER — SODIUM CHLORIDE 0.9% FLUSH: 3.0000 mL | Freq: Two times a day (BID) | INTRAVENOUS | Status: DC

## 2019-02-26 NOTE — Telephone Encounter (Signed)
Pt contacted pre-catheterization scheduled at [facility] for: Friday Feb 27, 2019 10:30 AM Verified arrival time and place: [place] Entrance A at: 8:30 AM Covid-19-5/13/20 negative  No solid food after midnight prior to cath, clear liquids until 5 AM day of procedure. Contrast allergy: no  AM meds can be  taken pre-cath with sip of water including: ASA 81 mg  Confirmed patient has responsible person to drive home post procedure and observe 24 hours after arriving home: yes   Pt advised due to Covid-19 pandemic, [facility] is restricting visitors and only patients should present for check-in prior to their procedure. People will not be allowed to enter [facility] with the patient. At this time [facility] is not allowing visitors to all [facility] campuses.    Per Dr Leanne Chang Daily Covid Update 02/24/19: Universal masks: Effective immediately, we are requiring all ED patients and all ambulatory care patients to wear a universal mask, which we will provide. In-patients will have a mask at their bedside and will only be asked to put it on when a caregiver comes into their room.       COVID-19 Pre-Screening Questions:  . In the past 7 to 10 days have you had a cough,  shortness of breath, headache, congestion, fever, body aches, chills, sore throat, or sudden loss of taste or sense of smell? no . Have you been around anyone with known Covid 19? no . Have you been around anyone who is awaiting Covid 19 test results in the past 7 to 10 days? no . Have you been around anyone who has been exposed to Covid 19, or has mentioned symptoms of Covid 19 within the past 7 to 10 days? no

## 2019-02-27 ENCOUNTER — Ambulatory Visit (HOSPITAL_COMMUNITY)
Admission: RE | Admit: 2019-02-27 | Discharge: 2019-02-27 | Disposition: A | Discharge status: Home / Self Care | Payer: Medicare Other | Attending: Cardiovascular Disease | Admitting: Cardiovascular Disease

## 2019-02-27 ENCOUNTER — Other Ambulatory Visit: Payer: Self-pay

## 2019-02-27 ENCOUNTER — Encounter (HOSPITAL_COMMUNITY)
Admission: RE | Disposition: A | Discharge status: Home / Self Care | Payer: Self-pay | Source: Home / Self Care | Attending: Cardiovascular Disease

## 2019-02-27 DIAGNOSIS — Z79899 Other long term (current) drug therapy: Secondary | ICD-10-CM | POA: Insufficient documentation

## 2019-02-27 DIAGNOSIS — I2510 Atherosclerotic heart disease of native coronary artery without angina pectoris: Secondary | ICD-10-CM | POA: Insufficient documentation

## 2019-02-27 DIAGNOSIS — Z87891 Personal history of nicotine dependence: Secondary | ICD-10-CM | POA: Insufficient documentation

## 2019-02-27 DIAGNOSIS — Z7982 Long term (current) use of aspirin: Secondary | ICD-10-CM | POA: Diagnosis not present

## 2019-02-27 DIAGNOSIS — R0789 Other chest pain: Secondary | ICD-10-CM | POA: Diagnosis not present

## 2019-02-27 DIAGNOSIS — Z885 Allergy status to narcotic agent status: Secondary | ICD-10-CM | POA: Diagnosis not present

## 2019-02-27 DIAGNOSIS — E785 Hyperlipidemia, unspecified: Secondary | ICD-10-CM | POA: Insufficient documentation

## 2019-02-27 DIAGNOSIS — R079 Chest pain, unspecified: Secondary | ICD-10-CM

## 2019-02-27 DIAGNOSIS — K219 Gastro-esophageal reflux disease without esophagitis: Secondary | ICD-10-CM | POA: Insufficient documentation

## 2019-02-27 HISTORY — PX: LEFT HEART CATH AND CORONARY ANGIOGRAPHY: CATH118249

## 2019-02-27 SURGERY — LEFT HEART CATH AND CORONARY ANGIOGRAPHY
Anesthesia: LOCAL

## 2019-02-27 MED ORDER — ACETAMINOPHEN 325 MG PO TABS: 650.0000 mg | ORAL_TABLET | ORAL | Status: DC | PRN

## 2019-02-27 MED ORDER — ASPIRIN 81 MG PO CHEW: 81.0000 mg | CHEWABLE_TABLET | ORAL | Status: DC

## 2019-02-27 MED ORDER — MIDAZOLAM HCL 2 MG/2ML IJ SOLN
INTRAMUSCULAR | Status: AC
  Filled 2019-02-27: qty 2

## 2019-02-27 MED ORDER — SODIUM CHLORIDE 0.9 % IV SOLN: INTRAVENOUS | Status: DC

## 2019-02-27 MED ORDER — ASPIRIN 81 MG PO CHEW: 81.0000 mg | CHEWABLE_TABLET | Freq: Every day | ORAL | Status: DC

## 2019-02-27 MED ORDER — HYDRALAZINE HCL 20 MG/ML IJ SOLN: 10.0000 mg | INTRAMUSCULAR | Status: DC | PRN

## 2019-02-27 MED ORDER — HEPARIN SODIUM (PORCINE) 1000 UNIT/ML IJ SOLN
INTRAMUSCULAR | Status: AC
  Filled 2019-02-27: qty 1

## 2019-02-27 MED ORDER — VERAPAMIL HCL 2.5 MG/ML IV SOLN
INTRAVENOUS | Status: AC
  Filled 2019-02-27: qty 2

## 2019-02-27 MED ORDER — VERAPAMIL HCL 2.5 MG/ML IV SOLN
INTRAVENOUS | Status: DC | PRN
  Administered 2019-02-27: 10 mL via INTRA_ARTERIAL

## 2019-02-27 MED ORDER — HEPARIN (PORCINE) IN NACL 1000-0.9 UT/500ML-% IV SOLN
INTRAVENOUS | Status: DC | PRN
  Administered 2019-02-27 (×2): 500 mL

## 2019-02-27 MED ORDER — SODIUM CHLORIDE 0.9 % IV SOLN: 250.0000 mL | INTRAVENOUS | Status: DC | PRN

## 2019-02-27 MED ORDER — ONDANSETRON HCL 4 MG/2ML IJ SOLN: 4.0000 mg | Freq: Four times a day (QID) | INTRAMUSCULAR | Status: DC | PRN

## 2019-02-27 MED ORDER — SODIUM CHLORIDE 0.9 % WEIGHT BASED INFUSION
3.0000 mL/kg/h | INTRAVENOUS | Status: AC
  Administered 2019-02-27: 3 mL/kg/h via INTRAVENOUS

## 2019-02-27 MED ORDER — HEPARIN (PORCINE) IN NACL 1000-0.9 UT/500ML-% IV SOLN
INTRAVENOUS | Status: AC
  Filled 2019-02-27: qty 500

## 2019-02-27 MED ORDER — LIDOCAINE HCL (PF) 1 % IJ SOLN
INTRAMUSCULAR | Status: DC | PRN
  Administered 2019-02-27: 2 mL via INTRADERMAL

## 2019-02-27 MED ORDER — SODIUM CHLORIDE 0.9% FLUSH: 3.0000 mL | Freq: Two times a day (BID) | INTRAVENOUS | Status: DC

## 2019-02-27 MED ORDER — SODIUM CHLORIDE 0.9 % WEIGHT BASED INFUSION: 1.0000 mL/kg/h | INTRAVENOUS | Status: DC

## 2019-02-27 MED ORDER — FENTANYL CITRATE (PF) 100 MCG/2ML IJ SOLN
INTRAMUSCULAR | Status: DC | PRN
  Administered 2019-02-27 (×2): 25 ug via INTRAVENOUS

## 2019-02-27 MED ORDER — SODIUM CHLORIDE 0.9% FLUSH: 3.0000 mL | INTRAVENOUS | Status: DC | PRN

## 2019-02-27 MED ORDER — HEPARIN SODIUM (PORCINE) 1000 UNIT/ML IJ SOLN
INTRAMUSCULAR | Status: DC | PRN
  Administered 2019-02-27: 5500 [IU] via INTRAVENOUS

## 2019-02-27 MED ORDER — IOHEXOL 350 MG/ML SOLN
INTRAVENOUS | Status: DC | PRN
  Administered 2019-02-27: 50 mL via INTRA_ARTERIAL

## 2019-02-27 MED ORDER — LIDOCAINE HCL (PF) 1 % IJ SOLN
INTRAMUSCULAR | Status: AC
  Filled 2019-02-27: qty 30

## 2019-02-27 MED ORDER — LABETALOL HCL 5 MG/ML IV SOLN: 10.0000 mg | INTRAVENOUS | Status: DC | PRN

## 2019-02-27 MED ORDER — MIDAZOLAM HCL 2 MG/2ML IJ SOLN
INTRAMUSCULAR | Status: DC | PRN
  Administered 2019-02-27: 2 mg via INTRAVENOUS
  Administered 2019-02-27: 1 mg via INTRAVENOUS

## 2019-02-27 MED ORDER — FENTANYL CITRATE (PF) 100 MCG/2ML IJ SOLN
INTRAMUSCULAR | Status: AC
  Filled 2019-02-27: qty 2

## 2019-02-27 MED ORDER — DIAZEPAM 5 MG PO TABS: 5.0000 mg | ORAL_TABLET | Freq: Four times a day (QID) | ORAL | Status: DC | PRN

## 2019-02-27 SURGICAL SUPPLY — 11 items
CATH INFINITI 5FR ANG PIGTAIL (CATHETERS) ×1 IMPLANT
CATH OPTITORQUE TIG 4.0 5F (CATHETERS) ×1 IMPLANT
COVER DOME SNAP 22 D (MISCELLANEOUS) ×1 IMPLANT
DEVICE RAD COMP TR BAND LRG (VASCULAR PRODUCTS) ×1 IMPLANT
GLIDESHEATH SLEND SS 6F .021 (SHEATH) ×1 IMPLANT
GUIDEWIRE INQWIRE 1.5J.035X260 (WIRE) IMPLANT
INQWIRE 1.5J .035X260CM (WIRE) ×2
KIT HEART LEFT (KITS) ×2 IMPLANT
PACK CARDIAC CATHETERIZATION (CUSTOM PROCEDURE TRAY) ×2 IMPLANT
TRANSDUCER W/STOPCOCK (MISCELLANEOUS) ×2 IMPLANT
TUBING CIL FLEX 10 FLL-RA (TUBING) ×2 IMPLANT

## 2019-02-27 NOTE — Discharge Instructions (Signed)
Radial Site Care ° °This sheet gives you information about how to care for yourself after your procedure. Your health care provider may also give you more specific instructions. If you have problems or questions, contact your health care provider. °What can I expect after the procedure? °After the procedure, it is common to have: °· Bruising and tenderness at the catheter insertion area. °Follow these instructions at home: °Medicines °· Take over-the-counter and prescription medicines only as told by your health care provider. °Insertion site care °· Follow instructions from your health care provider about how to take care of your insertion site. Make sure you: °? Wash your hands with soap and water before you change your bandage (dressing). If soap and water are not available, use hand sanitizer. °? Change your dressing as told by your health care provider. °? Leave stitches (sutures), skin glue, or adhesive strips in place. These skin closures may need to stay in place for 2 weeks or longer. If adhesive strip edges start to loosen and curl up, you may trim the loose edges. Do not remove adhesive strips completely unless your health care provider tells you to do that. °· Check your insertion site every day for signs of infection. Check for: °? Redness, swelling, or pain. °? Fluid or blood. °? Pus or a bad smell. °? Warmth. °· Do not take baths, swim, or use a hot tub until your health care provider approves. °· You may shower 24-48 hours after the procedure, or as directed by your health care provider. °? Remove the dressing and gently wash the site with plain soap and water. °? Pat the area dry with a clean towel. °? Do not rub the site. That could cause bleeding. °· Do not apply powder or lotion to the site. °Activity ° °· For 24 hours after the procedure, or as directed by your health care provider: °? Do not flex or bend the affected arm. °? Do not push or pull heavy objects with the affected arm. °? Do not  drive yourself home from the hospital or clinic. You may drive 24 hours after the procedure unless your health care provider tells you not to. °? Do not operate machinery or power tools. °· Do not lift anything that is heavier than 10 lb (4.5 kg), or the limit that you are told, until your health care provider says that it is safe. °· Ask your health care provider when it is okay to: °? Return to work or school. °? Resume usual physical activities or sports. °? Resume sexual activity. °General instructions °· If the catheter site starts to bleed, raise your arm and put firm pressure on the site. If the bleeding does not stop, get help right away. This is a medical emergency. °· If you went home on the same day as your procedure, a responsible adult should be with you for the first 24 hours after you arrive home. °· Keep all follow-up visits as told by your health care provider. This is important. °Contact a health care provider if: °· You have a fever. °· You have redness, swelling, or yellow drainage around your insertion site. °Get help right away if: °· You have unusual pain at the radial site. °· The catheter insertion area swells very fast. °· The insertion area is bleeding, and the bleeding does not stop when you hold steady pressure on the area. °· Your arm or hand becomes pale, cool, tingly, or numb. °These symptoms may represent a serious problem   that is an emergency. Do not wait to see if the symptoms will go away. Get medical help right away. Call your local emergency services (911 in the [place] Do not drive yourself to the hospital. °Summary °· After the procedure, it is common to have bruising and tenderness at the site. °· Follow instructions from your health care provider about how to take care of your radial site wound. Check the wound every day for signs of infection. °· Do not lift anything that is heavier than 10 lb (4.5 kg), or the limit that you are told, until your health care provider says  that it is safe. °This information is not intended to replace advice given to you by your health care provider. Make sure you discuss any questions you have with your health care provider. °Document Released: 11/03/2010 Document Revised: 11/06/2017 Document Reviewed: 11/06/2017 °Elsevier Interactive Patient Education © 2019 [employer] Inc[employer] ° °

## 2019-02-27 NOTE — Interval H&P Note (Signed)
Cath Lab Visit (complete for each Cath Lab visit)  Clinical Evaluation Leading to the Procedure:   ACS: No.  Non-ACS:    Anginal Classification: CCS II  Anti-ischemic medical therapy: Minimal Therapy (1 class of medications)  Non-Invasive Test Results: No non-invasive testing performed  Prior CABG: No previous CABG      History and Physical Interval Note:  02/27/2019 10:31 AM  Allen Robertson  has presented today for surgery, with the diagnosis of chest pain.  The various methods of treatment have been discussed with the patient and family. After consideration of risks, benefits and other options for treatment, the patient has consented to  Procedure(s): LEFT HEART CATH AND CORONARY ANGIOGRAPHY (N/A) as a surgical intervention.  The patient's history has been reviewed, patient examined, no change in status, stable for surgery.  I have reviewed the patient's chart and labs.  Questions were answered to the patient's satisfaction.     Shelva Majestic

## 2019-03-02 ENCOUNTER — Encounter (HOSPITAL_COMMUNITY): Payer: Self-pay | Admitting: Cardiovascular Disease

## 2019-03-11 ENCOUNTER — Telehealth: Payer: Self-pay | Admitting: Cardiology

## 2019-03-11 NOTE — Telephone Encounter (Signed)
Smartphone, call pt "home" number-- [phone], pre-reg complete, mychart active, verbal consent given 03/11/2019 MS

## 2019-03-12 NOTE — Progress Notes (Signed)
Virtual Visit via Video Note   This visit type was conducted due to national recommendations for restrictions regarding the COVID-19 Pandemic (e.g. social distancing) in an effort to limit this patient's exposure and mitigate transmission in our community.  Due to his co-morbid illnesses, this patient is at least at moderate risk for complications without adequate follow up.  This format is felt to be most appropriate for this patient at this time.  All issues noted in this document were discussed and addressed.  A limited physical exam was performed with this format.  Please refer to the patient's chart for his consent to telehealth for [facility].   Date:  03/13/2019   ID:  Allen Robertson, DOB 08-06-1951, MRN [id number]  Patient Location: Home Provider Location: Home  PCP:  Ivan Anchors, MD  Cardiologist:  Minus Breeding, MD  Electrophysiologist:  None   Evaluation Performed:  Follow-Up Visit  Chief Complaint:  Chest pain  History of Present Illness:    Allen Robertson is a 68 y.o. male for follow up of CAD.  I had a virtual visit with him in early April.  He had chest pain.  He subsequently had a cath and had non obstructive disease with a normal EF and 25% LAD stenosis.    He has had no further acute episodes.  The patient denies any new symptoms such as chest discomfort, neck or arm discomfort. There has been no new shortness of breath, PND or orthopnea. There have been no reported palpitations, presyncope or syncope.   In retrospect he says that he does have gallstones were noted on previous scan.  The patient does not have symptoms concerning for COVID-19 infection (fever, chills, cough, or new shortness of breath).    Past Medical History:  Diagnosis Date  . 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 Fracture of left clavicle    2000 / also had infection had to have IV infusion therapy   . GERD (gastroesophageal reflux disease)   . History of chicken pox   . History of  kidney stones   . Hyperlipidemia   . Recurrent falls   . Staph skin infection   . Tinnitus   . Varicose veins    Past Surgical History:  Procedure Laterality Date  . BRISTOW PROCEDURE     left shoulder   . I&D EXTREMITY     right lower leg   . JOINT REPLACEMENT    . LEFT HEART CATH AND CORONARY ANGIOGRAPHY N/A 02/27/2019   Procedure: LEFT HEART CATH AND CORONARY ANGIOGRAPHY;  Surgeon: Troy Sine, MD;  Location: [place] [facility];  Service: Cardiovascular;  Laterality: N/A;  . LEG SURGERY Right   . LITHOTRIPSY    . PICC LINE PLACE PERIPHERAL ([place] HX)     history of / secondary to right lower leg infection   . SHOULDER ARTHROSCOPY Left   . TONGUE SURGERY     robotic assisted with bilateral neck dissection 11/2011  . TOTAL KNEE ARTHROPLASTY Right    partial  . TOTAL KNEE REVISION Right 09/12/2015   Procedure: RIGHT TOTAL KNEE REVISION;  Surgeon: Paralee Cancel, MD;  Location: [facility];  Service: Orthopedics;  Laterality: Right;  . venous ablation right thigh      2009     Current Meds  Medication Sig  . acetaminophen (TYLENOL) 500 MG tablet Take 1,000 mg by mouth every 6 (six) hours as needed for mild pain.  Marland Kitchen acidophilus (RISAQUAD) CAPS capsule  Take 1 capsule by mouth 2 (two) times a week.   Marland Kitchen albuterol (VENTOLIN HFA) 108 (90 Base) MCG/ACT inhaler Inhale 2 puffs into the lungs every 6 (six) hours as needed for wheezing or shortness of breath.  Marland Kitchen aspirin EC 81 MG tablet Take 81 mg by mouth daily.  Marland Kitchen atorvastatin (LIPITOR) 10 MG tablet Take 10 mg by mouth daily at 6 PM.   . Cholecalciferol (VITAMIN D3) 125 MCG (5000 UT) CAPS Take 5,000 Units by mouth daily.  Marland Kitchen ibuprofen (ADVIL,MOTRIN) 800 MG tablet Take 800 mg by mouth every 8 (eight) hours as needed for moderate pain.   . metoprolol succinate (TOPROL-XL) 50 MG 24 hr tablet Take 1 tablet (50 mg total) by mouth daily.  . Multiple Vitamins-Minerals (CENTRUM ADULTS PO) Take 1 tablet by mouth daily.   . Multiple Vitamins-Minerals  (ICAPS AREDS 2 PO) Take 1 tablet by mouth 2 (two) times daily.  . nitroGLYCERIN (NITROSTAT) 0.4 MG SL tablet Place 1 tablet (0.4 mg total) under the tongue every 5 (five) minutes as needed for chest pain.  Marland Kitchen omeprazole (PRILOSEC) 20 MG capsule Take 20 mg by mouth daily.   . sodium chloride (OCEAN) 0.65 % SOLN nasal spray Place 1 spray into both nostrils as needed for congestion.  Marland Kitchen zolpidem (AMBIEN) 10 MG tablet Take 10 mg by mouth at bedtime.    Current Facility-Administered Medications for the 03/13/19 encounter (Telemedicine) with Minus Breeding, MD  Medication  . 0.9 %  sodium chloride infusion  . 0.9 %  sodium chloride infusion  . sodium chloride flush (NS) 0.9 % injection 3 mL     Allergies:   Percocet [oxycodone-acetaminophen]   Social History   Tobacco Use  . Smoking status: Former Smoker    Packs/day: 1.00    Years: 9.00    Pack years: 9.00    Types: Cigarettes    Last attempt to quit: 10/15/1980    Years since quitting: 38.4  . Smokeless tobacco: Former Systems developer    Types: [place] date: 10/16/2007  Substance Use Topics  . Alcohol use: Yes    Alcohol/week: 12.0 standard drinks    Types: 12 Cans of beer per week    Comment: socially  . Drug use: No     Family Hx: The patient's family history includes Colon cancer (age of onset: 64) in his maternal grandmother; Hypertension in his mother; Leukemia in his father.  ROS:   Please see the history of present illness.    As stated in the HPI and negative for all other systems.   Prior CV studies:   The following studies were reviewed today:  Cath  Labs/Other Tests and Data Reviewed:    EKG:  No ECG reviewed.  Recent Labs: 02/22/2019: BUN 14; Creatinine, Ser 0.87; Hemoglobin 15.1; Platelets 166; Potassium 3.9; Sodium 141   Recent Lipid Panel Lab Results  Component Value Date/Time   CHOL 127 05/28/2017 04:17 AM   TRIG 38 05/28/2017 04:17 AM   HDL 48 05/28/2017 04:17 AM   CHOLHDL 2.6 05/28/2017 04:17 AM    LDLCALC 71 05/28/2017 04:17 AM    Wt Readings from Last 3 Encounters:  03/13/19 249 lb (112.9 kg)  02/27/19 247 lb (112 kg)  02/24/19 248 lb (112.5 kg)     Objective:    Vital Signs:  BP 121/75   Pulse 70   Temp (!) 97.1 F (36.2 C)   Ht 6\' 2"  (1.88 m)   Wt 249 lb (112.9  kg)   BMI 31.97 kg/m    VITAL SIGNS:  reviewed GEN:  no acute distress EYES:  sclerae anicteric, EOMI - Extraocular Movements Intact NEURO:  alert and oriented x 3, no obvious focal deficit PSYCH:  normal affect  ASSESSMENT & PLAN:    CHEST PAIN:    This is nonanginal.  He does have gallstones and I asked him to discuss this with Ivan Anchors, MD this is clearly could be the etiology of his symptoms.  No further cardiac work-up is suggested.  He does not need to take the aspirin.  DYSLIPIDEMIA: It is not unreasonable for him to continue with the low-dose statin.   Time:   Today, I have spent 26 minutes with the patient with telehealth technology discussing the above problems.     Medication Adjustments/Labs and Tests Ordered: Current medicines are reviewed at length with the patient today.  Concerns regarding medicines are outlined above.   Tests Ordered: No orders of the defined types were placed in this encounter.   Medication Changes: No orders of the defined types were placed in this encounter.   Disposition:  Follow up prn  Signed, Minus Breeding, MD  03/13/2019 11:17 AM    [place]

## 2019-03-13 ENCOUNTER — Encounter: Payer: Self-pay | Admitting: Cardiology

## 2019-03-13 ENCOUNTER — Telehealth (INDEPENDENT_AMBULATORY_CARE_PROVIDER_SITE_OTHER): Payer: Medicare Other | Admitting: Cardiology

## 2019-03-13 VITALS — BP 121/75 | HR 70 | Temp 97.1°F | Ht 74.0 in | Wt 249.0 lb

## 2019-03-13 DIAGNOSIS — Z7189 Other specified counseling: Secondary | ICD-10-CM | POA: Insufficient documentation

## 2019-03-13 DIAGNOSIS — E785 Hyperlipidemia, unspecified: Secondary | ICD-10-CM

## 2019-03-13 DIAGNOSIS — R072 Precordial pain: Secondary | ICD-10-CM

## 2019-03-13 NOTE — Patient Instructions (Signed)

## 2019-04-03 ENCOUNTER — Ambulatory Visit: Payer: 59 | Admitting: Cardiology

## 2019-04-08 ENCOUNTER — Ambulatory Visit (INDEPENDENT_AMBULATORY_CARE_PROVIDER_SITE_OTHER): Payer: Medicare Other | Admitting: Gastroenterology

## 2019-04-08 ENCOUNTER — Encounter: Payer: Self-pay | Admitting: Gastroenterology

## 2019-04-08 VITALS — Ht 74.0 in | Wt 250.0 lb

## 2019-04-08 DIAGNOSIS — R079 Chest pain, unspecified: Secondary | ICD-10-CM

## 2019-04-08 DIAGNOSIS — K8020 Calculus of gallbladder without cholecystitis without obstruction: Secondary | ICD-10-CM

## 2019-04-08 NOTE — Patient Instructions (Signed)
Your provider has requested that you go to the basement level for lab work before leaving today. Press "B" on the elevator. The lab is located at the first door on the left as you exit the elevator.  You have been scheduled for an abdominal ultrasound at [facility] ([address]) on 04/15/19 at 10:45am. Please arrive 15 minutes prior to your appointment for registration. Make certain not to have anything to eat or drink after midnight prior to your appointment. Should you need to reschedule your appointment, please contact radiology at [phone]. This test typically takes about 30 minutes to perform.  Thank you for choosing me and [facility].  Pricilla Riffle. Dagoberto Ligas., MD., Marval Regal

## 2019-04-08 NOTE — Progress Notes (Signed)
    History of Present Illness: This is a 68 year old male with recurrent chest pain for about 2 years.  EGD performed in October 2018 for evaluation of similar symptoms showed erosive gastritis nonerosive duodenitis.  He has been maintained on omeprazole.  He relates episodes of chest pain that may last 30 minutes to 2 hours occasionally partially relieved with Tums but not predictably.  Symptoms are not related to meals or exertion.  He states he often notes some at night a few hours after his evening meal.  No other gastrointestinal complaints.  He is undergone cardiac evaluation that was unremarkable.  CT scan performed on January 30, 2019 at the [facility] revealed cholelithiasis with a tiny low-attenuation lesion in the left hepatic lobe too small to characterize but most likely a cyst.  Denies weight loss, abdominal pain, constipation, diarrhea, change in stool caliber, melena, hematochezia, nausea, vomiting, dysphagia, reflux symptoms.   Current Medications, Allergies, Past Medical History, Past Surgical History, Family History and Social History were reviewed in [employer] record.   Physical Exam: Telemedicine-not performed   Assessment and Recommendations:  1.  Intermittent chest pain for about 2 years, occurs at various times and frequently in the evenings.  Possible symptomatic cholelithiasis. Symptoms not typical for GERD and have not responded to treatment for GERD.   Proceed with RUQ [place] and obtain CMP, CBC, lipase. Pending results likely will proceed with surgical referral for consideration of cholecystectomy.  2.  History of erosive gastritis.  Probable GERD.  His chest pain symptoms are not typical for GERD and do not respond well to Tums.  Continue omeprazole 20 mg daily.  3.  CRC screening, average risk.  Next screening colonoscopy is due in October 2028.    These services were provided via telemedicine, audio and video.  The patient was at  home and the provider was in the office, alone.  The patient related that his wife was in the room and participating in the phone call but was not present on video. We discussed the limitations of evaluation and management by telemedicine and the availability of in person appointments.  Patient consented for this telemedicine visit and is aware of possible charges for this service.  Office CMA or LPN participated in this telemedicine service.  Time spent on call: 10 minutes

## 2019-04-13 ENCOUNTER — Other Ambulatory Visit (INDEPENDENT_AMBULATORY_CARE_PROVIDER_SITE_OTHER): Payer: Medicare Other

## 2019-04-13 DIAGNOSIS — R079 Chest pain, unspecified: Secondary | ICD-10-CM | POA: Diagnosis not present

## 2019-04-13 DIAGNOSIS — K8020 Calculus of gallbladder without cholecystitis without obstruction: Secondary | ICD-10-CM | POA: Diagnosis not present

## 2019-04-13 LAB — CBC WITH DIFFERENTIAL/PLATELET
Basophils Absolute: 0.1 10*3/uL (ref 0.0–0.1)
Basophils Relative: 1.2 % (ref 0.0–3.0)
Eosinophils Absolute: 0.2 10*3/uL (ref 0.0–0.7)
Eosinophils Relative: 3.2 % (ref 0.0–5.0)
HCT: 45.6 % (ref 39.0–52.0)
Hemoglobin: 15.5 g/dL (ref 13.0–17.0)
Lymphocytes Relative: 31.7 % (ref 12.0–46.0)
Lymphs Abs: 1.7 10*3/uL (ref 0.7–4.0)
MCHC: 34.1 g/dL (ref 30.0–36.0)
MCV: 98.2 fl (ref 78.0–100.0)
Monocytes Absolute: 0.5 10*3/uL (ref 0.1–1.0)
Monocytes Relative: 8.8 % (ref 3.0–12.0)
Neutro Abs: 2.9 10*3/uL (ref 1.4–7.7)
Neutrophils Relative %: 55.1 % (ref 43.0–77.0)
Platelets: 180 10*3/uL (ref 150.0–400.0)
RBC: 4.64 Mil/uL (ref 4.22–5.81)
RDW: 13.1 % (ref 11.5–15.5)
WBC: 5.3 10*3/uL (ref 4.0–10.5)

## 2019-04-13 LAB — COMPREHENSIVE METABOLIC PANEL
ALT: 16 U/L (ref 0–53)
AST: 21 U/L (ref 0–37)
Albumin: 4.4 g/dL (ref 3.5–5.2)
Alkaline Phosphatase: 89 U/L (ref 39–117)
BUN: 20 mg/dL (ref 6–23)
CO2: 25 mEq/L (ref 19–32)
Calcium: 9.5 mg/dL (ref 8.4–10.5)
Chloride: 106 mEq/L (ref 96–112)
Creatinine, Ser: 0.87 mg/dL (ref 0.40–1.50)
GFR: 87.56 mL/min (ref >60.00)
Glucose, Bld: 82 mg/dL (ref 70–99)
Potassium: 4.4 mEq/L (ref 3.5–5.1)
Sodium: 139 mEq/L (ref 135–145)
Total Bilirubin: 0.7 mg/dL (ref 0.2–1.2)
Total Protein: 7.4 g/dL (ref 6.0–8.3)

## 2019-04-13 LAB — LIPASE: Lipase: 27 U/L (ref 11.0–59.0)

## 2019-04-15 ENCOUNTER — Ambulatory Visit
Admission: RE | Admit: 2019-04-15 | Discharge: 2019-04-15 | Disposition: A | Discharge status: Home / Self Care | Payer: Medicare Other | Source: Ambulatory Visit | Attending: Gastroenterology | Admitting: Gastroenterology

## 2019-04-15 DIAGNOSIS — K8020 Calculus of gallbladder without cholecystitis without obstruction: Secondary | ICD-10-CM

## 2019-04-15 DIAGNOSIS — R079 Chest pain, unspecified: Secondary | ICD-10-CM

## 2019-04-28 ENCOUNTER — Ambulatory Visit: Payer: Self-pay | Admitting: Surgery

## 2019-04-28 NOTE — H&P (Signed)
Allen Robertson Documented: 04/28/2019 11:01 AM Location: [facility] Patient #: [id number] DOB: 1951/07/23 Married / Language: Allen Robertson / Race: White Male  History of Present Illness Allen Moores A. Arra Connaughton MD; 04/28/2019 11:27 AM) Patient words: Patient sent at the request of Dr. Fuller Robertson for symptomatic gallbladder disease. He has a 1-1/2 year history of epigastric abdominal pain and chest pain. Interim cardiac workup is negative and ultrasound showed gallstones without complicating feature. His common bile duct was 4 mm in his liver function studies are within normal limits. His pain was 2 hours after eating location epigastric and chest without radiation. He is resolved longer after a couple of hours fatty foods seem to make it worse. He has nausea and no vomiting. He's had no recent attack for treatment stitch. The patient had an attack every other month last 18 months.  The patient is a 68 year old male.   Allergies (Allen Robertson, CMA; 04/28/2019 11:01 AM) No Known Allergies [04/28/2019]: No Known Drug Allergies [04/28/2019]: Allergies Reconciled  Medication History (Allen Robertson, CMA; 04/28/2019 11:04 AM) Zolpidem Tartrate ER (6.25MG  Tablet ER, Oral) Active. Metoprolol Succinate ER (25MG  Tablet ER 24HR, Oral) Active. traZODone HCl (50MG  Tablet, Oral) Active. Centamin (Oral) Active. Medications Reconciled    Vitals (Allen Robertson CMA; 04/28/2019 11:05 AM) 04/28/2019 11:04 AM Weight: 246.2 lb Height: 74in Body Surface Area: 2.37 m Body Mass Index: 31.61 kg/m  Temp.: 98.11F(Oral)  Pulse: 99 (Regular)  BP: 128/70 (Sitting, Left Arm, Standard)        Physical Exam (Allen Allen Robertson A. Amulya Quintin MD; 04/28/2019 11:27 AM)  General Mental Status-Alert. General Appearance-Consistent with stated age. Hydration-Well hydrated. Voice-Normal.  Head and Neck Head-normocephalic, atraumatic with no lesions or palpable masses.  Eye Eyeball -  Bilateral-Extraocular movements intact. Sclera/Conjunctiva - Bilateral-No scleral icterus.  Chest and Lung Exam Chest and lung exam reveals -quiet, even and easy respiratory effort with no use of accessory muscles and on auscultation, normal breath sounds, no adventitious sounds and normal vocal resonance. Inspection Chest Wall - Normal. Back - normal.  Cardiovascular Cardiovascular examination reveals -on palpation PMI is normal in location and amplitude, no palpable S3 or S4. Normal cardiac borders., normal heart sounds, regular rate and rhythm with no murmurs, carotid auscultation reveals no bruits and normal pedal pulses bilaterally.  Abdomen Inspection Inspection of the abdomen reveals - No Hernias. Skin - Scar - no surgical scars. Palpation/Percussion Palpation and Percussion of the abdomen reveal - Soft, Non Tender, No Rebound tenderness, No Rigidity (guarding) and No hepatosplenomegaly. Auscultation Auscultation of the abdomen reveals - Bowel sounds normal.  Neurologic Neurologic evaluation reveals -alert and oriented x 3 with no impairment of recent or remote memory. Mental Status-Normal.  Musculoskeletal Normal Exam - Left-Upper Extremity Strength Normal and Lower Extremity Strength Normal. Normal Exam - Right-Upper Extremity Strength Normal, Lower Extremity Weakness.    Assessment & Robertson (Allen Allen Robertson A. Allen Krupinski MD; 04/28/2019 11:27 AM)  SYMPTOMATIC CHOLELITHIASIS (K80.20) Impression: Discussed the pros and cons of surgical treatments. Medical management discussed. Covid 19 risk surgery discussed and he was counseled. The procedure has been discussed with the patient. Risks of laparoscopic cholecystectomy include bleeding, infection, bile duct injury, leak, death, open surgery, diarrhea, other surgery, organ injury, blood vessel injury, DVT, and additional care.  Current Plans You are being scheduled for surgery- Our schedulers will call you.  You should  hear from our office's scheduling department within 5 working days about the location, date, and time of surgery. We try to make accommodations for patient's preferences  in scheduling surgery, but sometimes the OR schedule or the surgeon's schedule prevents [place] from making those accommodations.  If you have not heard from our office [phone]) in 5 working days, call the office and ask for your surgeon's nurse.  If you have other questions about your diagnosis, Robertson, or surgery, call the office and ask for your surgeon's nurse.  Pt Education - Pamphlet Given - Laparoscopic Gallbladder Surgery: discussed with patient and provided information. Written instructions provided Pt Education - CCS Laparosopic Post Op HCI (Allen Robertson) Pt Education - Laparoscopic Cholecystectomy: gallbladder The anatomy & physiology of hepatobiliary & pancreatic function was discussed. The pathophysiology of gallbladder dysfunction was discussed. Natural history risks without surgery was discussed. I feel the risks of no intervention will lead to serious problems that outweigh the operative risks; therefore, I recommended cholecystectomy to remove the pathology. I explained laparoscopic techniques with possible need for an open approach. Probable cholangiogram to evaluate the bilary tract was explained as well.  Risks such as bleeding, infection, abscess, leak, injury to other organs, need for further treatment, heart attack, death, and other risks were discussed. I noted a good likelihood this will help address the problem. Possibility that this will not correct all abdominal symptoms was explained. Goals of post-operative recovery were discussed as well. We will work to minimize complications. An educational handout further explaining the pathology and treatment options was given as well. Questions were answered. The patient expresses understanding & wishes to proceed with surgery.

## 2019-06-04 NOTE — Progress Notes (Signed)
[id number] - [place], [place] - [address] AT [address] [place] [address] Phone: [phone] Fax: [phone]      Your procedure is scheduled on August 25th.  Report to [facility] Main Entrance "A" at 7:00 A.M., and check in at the Admitting office.  Call this number if you have problems the morning of surgery:  [phone]  Call [phone] if you have any questions prior to your surgery date Monday-Friday 8am-4pm    Remember:  Do not eat after midnight the night before your surgery  You may drink clear liquids until 6:00 AM the morning of your surgery.   Clear liquids allowed are: Water, Non-Citrus Juices (without pulp), Carbonated Beverages, Clear Tea, Black Coffee Only, and Gatorade    Take these medicines the morning of surgery with A SIP OF WATER   Tylenol - if needed  Albuterol inhaler - if needed  Atorvastatin (lipitor)  Metoprolol   Nitroglycerin - if needed  Omeprazole (prilosec)  Nasal spray - if needed  7 days prior to surgery STOP taking any Aspirin (unless otherwise instructed by your surgeon), Aleve, Naproxen, Ibuprofen, Motrin, Advil, Goody's, BC's, all herbal medications, fish oil, and all vitamins.    The Morning of Surgery  Do not wear jewelry.  Do not wear lotions, powders, colognes, or deodorant  Men may shave face and neck.  Do not bring valuables to the hospital.  [facility] is not responsible for any belongings or valuables.  If you are a smoker, DO NOT Smoke 24 hours prior to surgery IF you wear a CPAP at night please bring your mask, tubing, and machine the morning of surgery   Remember that you must have someone to transport you home after your surgery, and remain with you for 24 hours if you are discharged the same day.   Contacts, glasses, hearing aids, dentures or bridgework may not be worn into surgery.    Leave your suitcase in the car.  After surgery it may be  brought to your room.  For patients admitted to the hospital, discharge time will be determined by your treatment team.  Patients discharged the day of surgery will not be allowed to drive home.    Special instructions:   [facility]- Preparing For Surgery  Before surgery, you can play an important role. Because skin is not sterile, your skin needs to be as free of germs as possible. You can reduce the number of germs on your skin by washing with CHG (chlorahexidine gluconate) Soap before surgery.  CHG is an antiseptic cleaner which kills germs and bonds with the skin to continue killing germs even after washing.    Oral Hygiene is also important to reduce your risk of infection.  Remember - BRUSH YOUR TEETH THE MORNING OF SURGERY WITH YOUR REGULAR TOOTHPASTE  Please do not use if you have an allergy to CHG or antibacterial soaps. If your skin becomes reddened/irritated stop using the CHG.  Do not shave (including legs and underarms) for at least 48 hours prior to first CHG shower. It is OK to shave your face.  Please follow these instructions carefully.   1. Shower the NIGHT BEFORE SURGERY and the MORNING OF SURGERY with CHG Soap.   2. If you chose to wash your hair, wash your hair first as usual with your normal shampoo.  3. After you shampoo, rinse your hair and body thoroughly to remove  the shampoo.  4. Use CHG as you would any other liquid soap. You can apply CHG directly to the skin and wash gently with a scrungie or a clean washcloth.   5. Apply the CHG Soap to your body ONLY FROM THE NECK DOWN.  Do not use on open wounds or open sores. Avoid contact with your eyes, ears, mouth and genitals (private parts). Wash Face and genitals (private parts)  with your normal soap.   6. Wash thoroughly, paying special attention to the area where your surgery will be performed.  7. Thoroughly rinse your body with warm water from the neck down.  8. DO NOT shower/wash with your normal soap  after using and rinsing off the CHG Soap.  9. Pat yourself dry with a CLEAN TOWEL.  10. Wear CLEAN PAJAMAS to bed the night before surgery, wear comfortable clothes the morning of surgery  11. Place CLEAN SHEETS on your bed the night of your first shower and DO NOT SLEEP WITH PETS.    Day of Surgery:  Do not apply any deodorants/lotions. Please shower the morning of surgery with the CHG soap  Please wear clean clothes to the hospital/surgery center.   Remember to brush your teeth WITH YOUR REGULAR TOOTHPASTE.   Please read over the following fact sheets that you were given.

## 2019-06-05 ENCOUNTER — Encounter (HOSPITAL_COMMUNITY): Payer: Self-pay

## 2019-06-05 ENCOUNTER — Other Ambulatory Visit (HOSPITAL_COMMUNITY)
Admission: RE | Admit: 2019-06-05 | Discharge: 2019-06-05 | Disposition: A | Discharge status: Home / Self Care | Payer: Medicare Other | Source: Ambulatory Visit | Attending: Surgery | Admitting: Surgery

## 2019-06-05 ENCOUNTER — Encounter (HOSPITAL_COMMUNITY)
Admission: RE | Admit: 2019-06-05 | Discharge: 2019-06-05 | Disposition: A | Discharge status: Home / Self Care | Payer: Medicare Other | Source: Ambulatory Visit | Attending: Surgery | Admitting: Surgery

## 2019-06-05 ENCOUNTER — Other Ambulatory Visit: Payer: Self-pay

## 2019-06-05 DIAGNOSIS — K8080 Other cholelithiasis without obstruction: Secondary | ICD-10-CM | POA: Diagnosis not present

## 2019-06-05 DIAGNOSIS — Z20828 Contact with and (suspected) exposure to other viral communicable diseases: Secondary | ICD-10-CM | POA: Diagnosis not present

## 2019-06-05 DIAGNOSIS — Z01812 Encounter for preprocedural laboratory examination: Secondary | ICD-10-CM | POA: Insufficient documentation

## 2019-06-05 HISTORY — DX: Headache, unspecified: R51.9

## 2019-06-05 LAB — CBC WITH DIFFERENTIAL/PLATELET
Abs Immature Granulocytes: 0.01 10*3/uL (ref 0.00–0.07)
Basophils Absolute: 0.1 10*3/uL (ref 0.0–0.1)
Basophils Relative: 1 %
Eosinophils Absolute: 0.2 10*3/uL (ref 0.0–0.5)
Eosinophils Relative: 3 %
HCT: 48.1 % (ref 39.0–52.0)
Hemoglobin: 15.9 g/dL (ref 13.0–17.0)
Immature Granulocytes: 0 %
Lymphocytes Relative: 29 %
Lymphs Abs: 1.7 10*3/uL (ref 0.7–4.0)
MCH: 33.4 pg (ref 26.0–34.0)
MCHC: 33.1 g/dL (ref 30.0–36.0)
MCV: 101.1 fL — ABNORMAL HIGH (ref 80.0–100.0)
Monocytes Absolute: 0.6 10*3/uL (ref 0.1–1.0)
Monocytes Relative: 10 %
Neutro Abs: 3.2 10*3/uL (ref 1.7–7.7)
Neutrophils Relative %: 57 %
Platelets: 171 10*3/uL (ref 150–400)
RBC: 4.76 MIL/uL (ref 4.22–5.81)
RDW: 12.2 % (ref 11.5–15.5)
WBC: 5.7 10*3/uL (ref 4.0–10.5)
nRBC: 0 % (ref 0.0–0.2)

## 2019-06-05 LAB — COMPREHENSIVE METABOLIC PANEL
ALT: 21 U/L (ref 0–44)
AST: 26 U/L (ref 15–41)
Albumin: 4.2 g/dL (ref 3.5–5.0)
Alkaline Phosphatase: 86 U/L (ref 38–126)
Anion gap: 10 (ref 5–15)
BUN: 14 mg/dL (ref 8–23)
CO2: 23 mmol/L (ref 22–32)
Calcium: 9.6 mg/dL (ref 8.9–10.3)
Chloride: 107 mmol/L (ref 98–111)
Creatinine, Ser: 0.79 mg/dL (ref 0.61–1.24)
GFR calc Af Amer: >60 mL/min (ref >60)
GFR calc non Af Amer: >60 mL/min (ref >60)
Glucose, Bld: 96 mg/dL (ref 70–99)
Potassium: 4.6 mmol/L (ref 3.5–5.1)
Sodium: 140 mmol/L (ref 135–145)
Total Bilirubin: 0.7 mg/dL (ref 0.3–1.2)
Total Protein: 7.2 g/dL (ref 6.5–8.1)

## 2019-06-05 LAB — SARS CORONAVIRUS 2 (TAT 6-24 HRS): SARS Coronavirus 2: NEGATIVE

## 2019-06-05 NOTE — Progress Notes (Addendum)
PCP - Dr. Olin Hauser Cardiologist - Dr. Percival Spanish  Chest x-ray - 02/22/2019 EKG - 02/23/2019 Stress Test -  ECHO - 2018 Cardiac Cath - 02/27/2019; patient states he was having chest pain and that was the reason for his cath but that it was normal and the chest pain was more epigastric and possibly due to his gallbladder.  Sleep Study - n/a CPAP -   Fasting Blood Sugar - n/a Checks Blood Sugar _____ times a day  Blood Thinner Instructions: n/a Aspirin Instructions:  Anesthesia review: yes, recent cardiac cath  Patient denies shortness of breath, fever, cough and chest pain at PAT appointment   Patient verbalized understanding of instructions that were given to them at the PAT appointment. Patient was also instructed that they will need to review over the PAT instructions again at home before surgery.

## 2019-06-08 MED ORDER — CLINDAMYCIN PHOSPHATE 900 MG/50ML IV SOLN
900.0000 mg | INTRAVENOUS | Status: AC
  Administered 2019-06-09: 900 mg via INTRAVENOUS
  Filled 2019-06-08: qty 50

## 2019-06-08 NOTE — Anesthesia Preprocedure Evaluation (Addendum)
Anesthesia Evaluation  Patient identified by MRN, date of birth, ID band Patient awake    Reviewed: Allergy & Precautions, NPO status , Patient's Chart, lab work & pertinent test results  Airway Mallampati: II  TM Distance: >3 FB Neck ROM: Full    Dental  (+) Teeth Intact, Dental Advisory Given   Pulmonary former smoker,    breath sounds clear to auscultation       Cardiovascular  Rhythm:Regular Rate:Normal     Neuro/Psych    GI/Hepatic   Endo/Other    Renal/GU      Musculoskeletal   Abdominal   Peds  Hematology   Anesthesia Other Findings   Reproductive/Obstetrics                            Anesthesia Physical Anesthesia Plan  ASA: II  Anesthesia Plan: General   Post-op Pain Management:    Induction: Intravenous  PONV Risk Score and Plan: Ondansetron and Dexamethasone  Airway Management Planned: Oral ETT  Additional Equipment:   Intra-op Plan:   Post-operative Plan: Extubation in OR  Informed Consent: I have reviewed the patients History and Physical, chart, labs and discussed the procedure including the risks, benefits and alternatives for the proposed anesthesia with the patient or authorized representative who has indicated his/her understanding and acceptance.     Dental advisory given  Plan Discussed with: CRNA and Anesthesiologist  Anesthesia Plan Comments: (Seen by Dr. Percival Spanish 03/13/19 for f/u of chest pain. Per OV note "I had a virtual visit with him in early April.  He had chest pain.  He subsequently had a cath and had non obstructive disease with a normal EF and 25% LAD stenosis...CHEST PAIN:  This is nonanginal.  He does have gallstones and I asked him to discuss this with Ivan Anchors, MD this is clearly could be the etiology of his symptoms.  No further cardiac work-up is suggested.  He does not need to take the aspirin."  Cath 02/27/19:  Prox LAD lesion  is 25% stenosed.  The left ventricular systolic function is normal.  LV end diastolic pressure is normal.   Normal LV function without wall motion abnormalities; LVEDP 13 mm.  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  The left circumflex and dominant RCA are angiographically normal.  RECOMMENDATION: Continue aspirin for nonobstructive CAD.  Aggressive lipid intervention with target LDL less than 70 in an attempt to induce plaque regression.  Consider noncardiac etiologies of the patient's recent chest pain.)       Anesthesia Quick Evaluation

## 2019-06-09 ENCOUNTER — Ambulatory Visit (HOSPITAL_COMMUNITY)
Admission: RE | Admit: 2019-06-09 | Discharge: 2019-06-09 | Disposition: A | Discharge status: Home / Self Care | Payer: Medicare Other | Attending: Surgery | Admitting: Surgery

## 2019-06-09 ENCOUNTER — Encounter (HOSPITAL_COMMUNITY)
Admission: RE | Disposition: A | Discharge status: Home / Self Care | Payer: Self-pay | Source: Home / Self Care | Attending: Surgery

## 2019-06-09 ENCOUNTER — Encounter (HOSPITAL_COMMUNITY): Payer: Self-pay

## 2019-06-09 ENCOUNTER — Ambulatory Visit (HOSPITAL_COMMUNITY): Payer: Medicare Other | Admitting: Anesthesiology

## 2019-06-09 ENCOUNTER — Ambulatory Visit (HOSPITAL_COMMUNITY): Payer: Medicare Other | Admitting: Physician Assistant

## 2019-06-09 DIAGNOSIS — Z79899 Other long term (current) drug therapy: Secondary | ICD-10-CM | POA: Insufficient documentation

## 2019-06-09 DIAGNOSIS — K8010 Calculus of gallbladder with chronic cholecystitis without obstruction: Secondary | ICD-10-CM | POA: Insufficient documentation

## 2019-06-09 DIAGNOSIS — K8020 Calculus of gallbladder without cholecystitis without obstruction: Secondary | ICD-10-CM | POA: Diagnosis present

## 2019-06-09 HISTORY — PX: CHOLECYSTECTOMY: SHX55

## 2019-06-09 SURGERY — LAPAROSCOPIC CHOLECYSTECTOMY WITH INTRAOPERATIVE CHOLANGIOGRAM
Anesthesia: General | Site: Abdomen

## 2019-06-09 MED ORDER — FENTANYL CITRATE (PF) 250 MCG/5ML IJ SOLN
INTRAMUSCULAR | Status: AC
  Filled 2019-06-09: qty 5

## 2019-06-09 MED ORDER — LACTATED RINGERS IV SOLN
INTRAVENOUS | Status: DC
  Administered 2019-06-09 (×2): via INTRAVENOUS

## 2019-06-09 MED ORDER — ROCURONIUM BROMIDE 10 MG/ML (PF) SYRINGE
PREFILLED_SYRINGE | INTRAVENOUS | Status: DC | PRN
  Administered 2019-06-09: 20 mg via INTRAVENOUS
  Administered 2019-06-09: 30 mg via INTRAVENOUS
  Administered 2019-06-09: 50 mg via INTRAVENOUS

## 2019-06-09 MED ORDER — HYDROCODONE-ACETAMINOPHEN 5-325 MG PO TABS: 1.0000 | ORAL_TABLET | Freq: Four times a day (QID) | ORAL | 0 refills | Status: DC | PRN

## 2019-06-09 MED ORDER — 0.9 % SODIUM CHLORIDE (POUR BTL) OPTIME
TOPICAL | Status: DC | PRN
  Administered 2019-06-09: 1000 mL

## 2019-06-09 MED ORDER — SODIUM CHLORIDE 0.9 % IR SOLN
Status: DC | PRN
  Administered 2019-06-09: 1000 mL

## 2019-06-09 MED ORDER — ONDANSETRON HCL 4 MG/2ML IJ SOLN
INTRAMUSCULAR | Status: DC | PRN
  Administered 2019-06-09 (×2): 4 mg via INTRAVENOUS

## 2019-06-09 MED ORDER — IBUPROFEN 800 MG PO TABS: 800.0000 mg | ORAL_TABLET | Freq: Three times a day (TID) | ORAL | 0 refills | Status: DC | PRN

## 2019-06-09 MED ORDER — LIDOCAINE 2% (20 MG/ML) 5 ML SYRINGE
INTRAMUSCULAR | Status: DC | PRN
  Administered 2019-06-09: 60 mg via INTRAVENOUS

## 2019-06-09 MED ORDER — EPHEDRINE SULFATE-NACL 50-0.9 MG/10ML-% IV SOSY
PREFILLED_SYRINGE | INTRAVENOUS | Status: DC | PRN
  Administered 2019-06-09: 10 mg via INTRAVENOUS

## 2019-06-09 MED ORDER — MIDAZOLAM HCL 2 MG/2ML IJ SOLN
INTRAMUSCULAR | Status: AC
  Filled 2019-06-09: qty 2

## 2019-06-09 MED ORDER — MIDAZOLAM HCL 5 MG/5ML IJ SOLN
INTRAMUSCULAR | Status: DC | PRN
  Administered 2019-06-09: 2 mg via INTRAVENOUS

## 2019-06-09 MED ORDER — FENTANYL CITRATE (PF) 250 MCG/5ML IJ SOLN
INTRAMUSCULAR | Status: DC | PRN
  Administered 2019-06-09 (×3): 50 ug via INTRAVENOUS
  Administered 2019-06-09: 100 ug via INTRAVENOUS

## 2019-06-09 MED ORDER — ONDANSETRON HCL 4 MG/2ML IJ SOLN: 4.0000 mg | Freq: Once | INTRAMUSCULAR | Status: DC | PRN

## 2019-06-09 MED ORDER — DEXAMETHASONE SODIUM PHOSPHATE 10 MG/ML IJ SOLN
INTRAMUSCULAR | Status: DC | PRN
  Administered 2019-06-09: 10 mg via INTRAVENOUS

## 2019-06-09 MED ORDER — CELECOXIB 200 MG PO CAPS
200.0000 mg | ORAL_CAPSULE | ORAL | Status: AC
  Administered 2019-06-09: 200 mg via ORAL
  Filled 2019-06-09: qty 1

## 2019-06-09 MED ORDER — KETOROLAC TROMETHAMINE 30 MG/ML IJ SOLN
INTRAMUSCULAR | Status: AC
  Filled 2019-06-09: qty 1

## 2019-06-09 MED ORDER — KETOROLAC TROMETHAMINE 30 MG/ML IJ SOLN
30.0000 mg | Freq: Once | INTRAMUSCULAR | Status: AC
  Administered 2019-06-09: 30 mg via INTRAVENOUS

## 2019-06-09 MED ORDER — BUPIVACAINE-EPINEPHRINE (PF) 0.25% -1:200000 IJ SOLN
INTRAMUSCULAR | Status: AC
  Filled 2019-06-09: qty 30

## 2019-06-09 MED ORDER — BUPIVACAINE-EPINEPHRINE 0.25% -1:200000 IJ SOLN
INTRAMUSCULAR | Status: DC | PRN
  Administered 2019-06-09: 11 mL

## 2019-06-09 MED ORDER — CHLORHEXIDINE GLUCONATE CLOTH 2 % EX PADS: 6.0000 | MEDICATED_PAD | Freq: Once | CUTANEOUS | Status: DC

## 2019-06-09 MED ORDER — SUGAMMADEX SODIUM 200 MG/2ML IV SOLN
INTRAVENOUS | Status: DC | PRN
  Administered 2019-06-09: 200 mg via INTRAVENOUS

## 2019-06-09 MED ORDER — GABAPENTIN 300 MG PO CAPS
300.0000 mg | ORAL_CAPSULE | ORAL | Status: AC
  Administered 2019-06-09: 300 mg via ORAL
  Filled 2019-06-09: qty 3

## 2019-06-09 MED ORDER — SODIUM CHLORIDE 0.9 % IV SOLN
INTRAVENOUS | Status: DC | PRN
  Administered 2019-06-09: 10:00:00 25 ug/min via INTRAVENOUS

## 2019-06-09 MED ORDER — FENTANYL CITRATE (PF) 100 MCG/2ML IJ SOLN
INTRAMUSCULAR | Status: AC
  Filled 2019-06-09: qty 2

## 2019-06-09 MED ORDER — PROPOFOL 10 MG/ML IV BOLUS
INTRAVENOUS | Status: DC | PRN
  Administered 2019-06-09: 180 mg via INTRAVENOUS

## 2019-06-09 MED ORDER — FENTANYL CITRATE (PF) 100 MCG/2ML IJ SOLN
25.0000 ug | INTRAMUSCULAR | Status: DC | PRN
  Administered 2019-06-09 (×2): 50 ug via INTRAVENOUS

## 2019-06-09 MED ORDER — MIDAZOLAM HCL 2 MG/2ML IJ SOLN
1.0000 mg | Freq: Once | INTRAMUSCULAR | Status: AC
  Administered 2019-06-09: 1 mg via INTRAVENOUS

## 2019-06-09 SURGICAL SUPPLY — 42 items
APPLIER CLIP ROT 10 11.4 M/L (STAPLE) ×2
BLADE CLIPPER SURG (BLADE) IMPLANT
CANISTER SUCT 3000ML PPV (MISCELLANEOUS) ×2 IMPLANT
CHLORAPREP W/TINT 26 (MISCELLANEOUS) ×2 IMPLANT
CLIP APPLIE ROT 10 11.4 M/L (STAPLE) ×1 IMPLANT
COVER MAYO STAND STRL (DRAPES) ×2 IMPLANT
COVER SURGICAL LIGHT HANDLE (MISCELLANEOUS) ×2 IMPLANT
COVER WAND RF STERILE (DRAPES) ×2 IMPLANT
DERMABOND ADVANCED (GAUZE/BANDAGES/DRESSINGS) ×1
DERMABOND ADVANCED .7 DNX12 (GAUZE/BANDAGES/DRESSINGS) ×1 IMPLANT
DRAPE C-ARM 42X72 X-RAY (DRAPES) ×2 IMPLANT
DRAPE WARM FLUID 44X44 (DRAPES) ×2 IMPLANT
ELECT REM PT RETURN 9FT ADLT (ELECTROSURGICAL) ×2
ELECTRODE REM PT RTRN 9FT ADLT (ELECTROSURGICAL) ×1 IMPLANT
GLOVE BIO SURGEON STRL SZ8 (GLOVE) ×2 IMPLANT
GLOVE BIOGEL PI IND STRL 8 (GLOVE) ×1 IMPLANT
GLOVE BIOGEL PI INDICATOR 8 (GLOVE) ×1
GOWN STRL REUS W/ TWL LRG LVL3 (GOWN DISPOSABLE) ×2 IMPLANT
GOWN STRL REUS W/ TWL XL LVL3 (GOWN DISPOSABLE) ×1 IMPLANT
GOWN STRL REUS W/TWL LRG LVL3 (GOWN DISPOSABLE) ×2
GOWN STRL REUS W/TWL XL LVL3 (GOWN DISPOSABLE) ×1
KIT BASIN OR (CUSTOM PROCEDURE TRAY) ×2 IMPLANT
KIT TURNOVER KIT B (KITS) ×2 IMPLANT
NS IRRIG 1000ML POUR BTL (IV SOLUTION) ×2 IMPLANT
PAD ARMBOARD 7.5X6 YLW CONV (MISCELLANEOUS) ×2 IMPLANT
POUCH RETRIEVAL ECOSAC 10 (ENDOMECHANICALS) ×1 IMPLANT
POUCH RETRIEVAL ECOSAC 10MM (ENDOMECHANICALS) ×1
SCISSORS LAP 5X35 DISP (ENDOMECHANICALS) ×2 IMPLANT
SET CHOLANGIOGRAPH 5 50 .035 (SET/KITS/TRAYS/PACK) ×2 IMPLANT
SET IRRIG TUBING LAPAROSCOPIC (IRRIGATION / IRRIGATOR) ×2 IMPLANT
SET TUBE SMOKE EVAC HIGH FLOW (TUBING) ×2 IMPLANT
SLEEVE ENDOPATH XCEL 5M (ENDOMECHANICALS) ×2 IMPLANT
SPECIMEN JAR SMALL (MISCELLANEOUS) ×2 IMPLANT
SUT MNCRL AB 4-0 PS2 18 (SUTURE) ×2 IMPLANT
SUT MON AB 4-0 PC3 18 (SUTURE) ×2 IMPLANT
TOWEL GREEN STERILE (TOWEL DISPOSABLE) ×2 IMPLANT
TOWEL GREEN STERILE FF (TOWEL DISPOSABLE) ×2 IMPLANT
TRAY LAPAROSCOPIC MC (CUSTOM PROCEDURE TRAY) ×2 IMPLANT
TROCAR XCEL BLUNT TIP 100MML (ENDOMECHANICALS) ×2 IMPLANT
TROCAR XCEL NON-BLD 11X100MML (ENDOMECHANICALS) ×2 IMPLANT
TROCAR XCEL NON-BLD 5MMX100MML (ENDOMECHANICALS) ×2 IMPLANT
WATER STERILE IRR 1000ML POUR (IV SOLUTION) ×2 IMPLANT

## 2019-06-09 NOTE — Interval H&P Note (Signed)
History and Physical Interval Note:  06/09/2019 8:23 AM  Allen Robertson  has presented today for surgery, with the diagnosis of gallstones.  The various methods of treatment have been discussed with the patient and family. After consideration of risks, benefits and other options for treatment, the patient has consented to  Procedure(s): LAPAROSCOPIC CHOLECYSTECTOMY WITH INTRAOPERATIVE CHOLANGIOGRAM (N/A) as a surgical intervention.  The patient's history has been reviewed, patient examined, no change in status, stable for surgery.  I have reviewed the patient's chart and labs.  Questions were answered to the patient's satisfaction.  The procedure has been discussed with the patient. Operative and non operative treatments have been discussed. Risks of surgery include bleeding, infection,  Common bile duct injury,  Injury to the stomach,liver, colon,small intestine, abdominal wall,  Diaphragm,  Major blood vessels,  And the need for an open procedure.  Other risks include worsening of medical problems, death,  DVT and pulmonary embolism, and cardiovascular events.   Medical options have also been discussed. The patient has been informed of long term expectations of surgery and non surgical options,  The patient agrees to proceed.     [place]

## 2019-06-09 NOTE — Anesthesia Procedure Notes (Signed)
Procedure Name: Intubation Date/Time: 06/09/2019 9:10 AM Performed by: Marsa Aris, CRNA Pre-anesthesia Checklist: Patient identified, Emergency Drugs available, Suction available and Patient being monitored Patient Re-evaluated:Patient Re-evaluated prior to induction Oxygen Delivery Method: Circle System Utilized Preoxygenation: Pre-oxygenation with 100% oxygen Induction Type: IV induction Ventilation: Mask ventilation without difficulty Laryngoscope Size: Miller and 1 Grade View: Grade I Tube type: Oral Tube size: 7.5 mm Number of attempts: 1 Airway Equipment and Method: Stylet and Oral airway Placement Confirmation: ETT inserted through vocal cords under direct vision,  positive ETCO2 and breath sounds checked- equal and bilateral Secured at: 22 cm Tube secured with: Tape Dental Injury: Teeth and Oropharynx as per pre-operative assessment  Comments: No change in dentition from pre-procedure

## 2019-06-09 NOTE — Op Note (Signed)
Laparoscopic Cholecystectomy  Procedure Note  Indications: This patient presents with symptomatic gallbladder disease and will undergo laparoscopic cholecystectomy.The procedure has been discussed with the patient. Operative and non operative treatments have been discussed. Risks of surgery include bleeding, infection,  Common bile duct injury,  Injury to the stomach,liver, colon,small intestine, abdominal wall,  Diaphragm,  Major blood vessels,  And the need for an open procedure.  Other risks include worsening of medical problems, death,  DVT and pulmonary embolism, and cardiovascular events.   Medical options have also been discussed. The patient has been informed of long term expectations of surgery and non surgical options,  The patient agrees to proceed.    Pre-operative Diagnosis: Calculus of gallbladder without mention of cholecystitis or obstruction  Post-operative Diagnosis: Same  Surgeon: Turner Daniels MD   Assistants: Gaynell Face  RNFA   Anesthesia: General endotracheal anesthesia and Local anesthesia 0.25.% bupivacaine, with epinephrine  ASA Class: 2  Procedure Details  The patient was seen again in the Holding Room. The risks, benefits, complications, treatment options, and expected outcomes were discussed with the patient. The possibilities of reaction to medication, pulmonary aspiration, perforation of viscus, bleeding, recurrent infection, finding a normal gallbladder, the need for additional procedures, failure to diagnose a condition, the possible need to convert to an open procedure, and creating a complication requiring transfusion or operation were discussed with the patient. The patient and/or family concurred with the proposed plan, giving informed consent. The site of surgery properly noted/marked. The patient was taken to Operating Room, identified as DAGEM LAZER and the procedure verified as Laparoscopic Cholecystectomy with Intraoperative Cholangiograms. A Time Out  was held and the above information confirmed.  Prior to the induction of general anesthesia, antibiotic prophylaxis was administered. General endotracheal anesthesia was then administered and tolerated well. After the induction, the abdomen was prepped in the usual sterile fashion. The patient was positioned in the supine position with the left arm comfortably tucked, along with some reverse Trendelenburg.  Local anesthetic agent was injected into the skin near the umbilicus and an incision made. The midline fascia was incised and the Hasson technique was used to introduce a 12 mm port under direct vision. It was secured with a figure of eight Vicryl suture placed in the usual fashion. Pneumoperitoneum was then created with CO2 and tolerated well without any adverse changes in the patient's vital signs. Additional trocars were introduced under direct vision with an 11 mm trocar in the epigastrium and 2 5 mm trocars in the right upper quadrant. All skin incisions were infiltrated with a local anesthetic agent before making the incision and placing the trocars.   The gallbladder was identified, the fundus grasped and retracted cephalad. Adhesions were lysed bluntly and with the electrocautery where indicated, taking care not to injure any adjacent organs or viscus. The infundibulum was grasped and retracted laterally, exposing the peritoneum overlying the triangle of Calot. This was then divided and exposed in a blunt fashion. The cystic duct was clearly identified and bluntly dissected circumferentially. The junctions of the gallbladder, cystic duct and common bile duct were clearly identified prior to the division of any linear structure.   The critical view was obtained and the cystic duct was extremely small. The common bile duct was away from the operative field. He had normal Labs and no dilation on ultrasound. Cholangiogram was not performed.  The cystic duct was then  ligated with surgical clips   on the patient side and  clipped on  the gallbladder side and divided. The cystic artery was identified, dissected free, ligated with clips and divided as well. Posterior cystic artery clipped and divided.  The gallbladder was dissected from the liver bed in retrograde fashion with the electrocautery. The gallbladder was removed. The liver bed was irrigated and inspected. Hemostasis was achieved with the electrocautery. Copious irrigation was utilized and was repeatedly aspirated until clear all particulate matter. Hemostasis was achieved with no signs  Of bleeding or bile leakage.  Pneumoperitoneum was completely reduced after viewing removal of the trocars under direct vision. The wound was thoroughly irrigated and the fascia was then closed with a figure of eight suture; the skin was then closed with 4 O monocryl  and a sterile dressing of dermabond  was applied.  Instrument, sponge, and needle counts were correct at closure and at the conclusion of the case.   Findings: Cholelithiasis  Estimated Blood Loss: Minimal         Drains: none         Total IV Fluids: per record          Specimens: Gallbladder           Complications: None; patient tolerated the procedure well.         Disposition: PACU - hemodynamically stable.         Condition: stable

## 2019-06-09 NOTE — Discharge Instructions (Signed)
[facility] ______[facility] °LAPAROSCOPIC SURGERY: POST OP INSTRUCTIONS °Always review your discharge instruction sheet given to you by the facility where your surgery was performed. °IF YOU HAVE DISABILITY OR FAMILY LEAVE FORMS, YOU MUST BRING THEM TO THE OFFICE FOR PROCESSING.   °DO NOT GIVE THEM TO YOUR DOCTOR. ° °1. A prescription for pain medication may be given to you upon discharge.  Take your pain medication as prescribed, if needed.  If narcotic pain medicine is not needed, then you may take acetaminophen (Tylenol) or ibuprofen (Advil) as needed. °2. Take your usually prescribed medications unless otherwise directed. °3. If you need a refill on your pain medication, please contact your pharmacy.  They will contact our office to request authorization. Prescriptions will not be filled after 5pm or on week-ends. °4. You should follow a light diet the first few days after arrival home, such as soup and crackers, etc.  Be sure to include lots of fluids daily. °5. Most patients will experience some swelling and bruising in the area of the incisions.  Ice packs will help.  Swelling and bruising can take several days to resolve.  °6. It is common to experience some constipation if taking pain medication after surgery.  Increasing fluid intake and taking a stool softener (such as Colace) will usually help or prevent this problem from occurring.  A mild laxative (Milk of Magnesia or Miralax) should be taken according to package instructions if there are no bowel movements after 48 hours. °7. Unless discharge instructions indicate otherwise, you may remove your bandages 24-48 hours after surgery, and you may shower at that time.  You may have steri-strips (small skin tapes) in place directly over the incision.  These strips should be left on the skin for 7-10 days.  If your surgeon used skin glue on the incision, you may shower in 24 hours.  The glue will flake off over the next 2-3 weeks.  Any sutures or  staples will be removed at the office during your follow-up visit. °8. ACTIVITIES:  You may resume regular (light) daily activities beginning the next day--such as daily self-care, walking, climbing stairs--gradually increasing activities as tolerated.  You may have sexual intercourse when it is comfortable.  Refrain from any heavy lifting or straining until approved by your doctor. °a. You may drive when you are no longer taking prescription pain medication, you can comfortably wear a seatbelt, and you can safely maneuver your car and apply brakes. °b. RETURN TO WORK:  __________________________________________________________ °9. You should see your doctor in the office for a follow-up appointment approximately 2-3 weeks after your surgery.  Make sure that you call for this appointment within a day or two after you arrive home to insure a convenient appointment time. °10. OTHER INSTRUCTIONS: __________________________________________________________________________________________________________________________ __________________________________________________________________________________________________________________________ °WHEN TO CALL YOUR DOCTOR: °1. Fever over 101.0 °2. Inability to urinate °3. Continued bleeding from incision. °4. Increased pain, redness, or drainage from the incision. °5. Increasing abdominal pain ° °The clinic staff is available to answer your questions during regular business hours.  Please don’t hesitate to call and ask to speak to one of the nurses for clinical concerns.  If you have a medical emergency, go to the nearest emergency room or call 911.  A surgeon from [facility] is always on call at the hospital. °[address], [address], [place], [place]  [address] ? [address] [address], [place], [place]   [address] °[phone] ? [phone] ? FAX [phone] °Web site:   [email or web address] °

## 2019-06-09 NOTE — Anesthesia Postprocedure Evaluation (Signed)
Anesthesia Post Note  Patient: Allen Robertson  Procedure(s) Performed: LAPAROSCOPIC CHOLECYSTECTOMY (N/A Abdomen)     Patient location during evaluation: PACU Anesthesia Type: General Level of consciousness: awake and alert Pain management: pain level controlled Vital Signs Assessment: post-procedure vital signs reviewed and stable Respiratory status: spontaneous breathing, nonlabored ventilation, respiratory function stable and patient connected to nasal cannula oxygen Cardiovascular status: blood pressure returned to baseline and stable Postop Assessment: no apparent nausea or vomiting Anesthetic complications: no    Last Vitals:  Vitals:   06/09/19 1129 06/09/19 1130  BP: 136/74   Pulse: 68 76  Resp: 20 17  Temp:    SpO2: 98% 97%    Last Pain:  Vitals:   06/09/19 1130  TempSrc:   PainSc: 8                  Donavan Kerlin,Nihaal COKER

## 2019-06-09 NOTE — H&P (Signed)
Allen Robertson  Location: [facility] Surgery Patient #: [id number] DOB: [birth year] Married / Language: English / Race: White Male  History of Present Illness  Patient words: Patient sent at the request of Dr. Fuller Plan for symptomatic gallbladder disease. He has a 1-1/2 year history of epigastric abdominal pain and chest pain. Interim cardiac workup is negative and ultrasound showed gallstones without complicating feature. His common bile duct was 4 mm in his liver function studies are within normal limits. His pain was 2 hours after eating location epigastric and chest without radiation. He is resolved longer after a couple of hours fatty foods seem to make it worse. He has nausea and no vomiting. He's had no recent attack for treatment stitch. The patient had an attack every other month last 18 months.  The patient is a 68 year old male.   Allergies  No Known Allergies  No Known Drug Allergies  Allergies Reconciled  Medication History  Zolpidem Tartrate ER (6.25MG  Tablet ER, Oral) Active. Metoprolol Succinate ER (25MG  Tablet ER 24HR, Oral) Active. traZODone HCl (50MG  Tablet, Oral) Active. Centamin (Oral) Active. Medications Reconciled    Vitals   Weight: 246.2 lb Height: 74in Body Surface Area: 2.37 m Body Mass Index: 31.61 kg/m  Temp.: 98.16F(Oral)  Pulse: 99 (Regular)  BP: 128/70 (Sitting, Left Arm, Standard)        Physical Exam   General Mental Status-Alert. General Appearance-Consistent with stated age. Hydration-Well hydrated. Voice-Normal.  Head and Neck Head-normocephalic, atraumatic with no lesions or palpable masses.  Eye Eyeball - Bilateral-Extraocular movements intact. Sclera/Conjunctiva - Bilateral-No scleral icterus.  Chest and Lung Exam Chest and lung exam reveals -quiet, even and easy respiratory effort with no use of accessory muscles and on auscultation, normal breath sounds,  no adventitious sounds and normal vocal resonance. Inspection Chest Wall - Normal. Back - normal.  Cardiovascular Cardiovascular examination reveals -on palpation PMI is normal in location and amplitude, no palpable S3 or S4. Normal cardiac borders., normal heart sounds, regular rate and rhythm with no murmurs, carotid auscultation reveals no bruits and normal pedal pulses bilaterally.  Abdomen Inspection Inspection of the abdomen reveals - No Hernias. Skin - Scar - no surgical scars. Palpation/Percussion Palpation and Percussion of the abdomen reveal - Soft, Non Tender, No Rebound tenderness, No Rigidity (guarding) and No hepatosplenomegaly. Auscultation Auscultation of the abdomen reveals - Bowel sounds normal.  Neurologic Neurologic evaluation reveals -alert and oriented x 3 with no impairment of recent or remote memory. Mental Status-Normal.  Musculoskeletal Normal Exam - Left-Upper Extremity Strength Normal and Lower Extremity Strength Normal. Normal Exam - Right-Upper Extremity Strength Normal, Lower Extremity Weakness.    Assessment & Plan   SYMPTOMATIC CHOLELITHIASIS (K80.20) Impression: Discussed the pros and cons of surgical treatments. Medical management discussed. Covid 19 risk surgery discussed and he was counseled. The procedure has been discussed with the patient. Risks of laparoscopic cholecystectomy include bleeding, infection, bile duct injury, leak, death, open surgery, diarrhea, other surgery, organ injury, blood vessel injury, DVT, and additional care.  Current Plans You are being scheduled for surgery- Our schedulers will call you.  You should hear from our office's scheduling department within 5 working days about the location, date, and time of surgery. We try to make accommodations for patient's preferences in scheduling surgery, but sometimes the OR schedule or the surgeon's schedule prevents [place] from making those accommodations.  If  you have not heard from our office [phone]) in 5 working days, call the office and ask for  your surgeon's nurse.  If you have other questions about your diagnosis, plan, or surgery, call the office and ask for your surgeon's nurse.  Pt Education - Pamphlet Given - Laparoscopic Gallbladder Surgery: discussed with patient and provided information. Written instructions provided Pt Education - [facility] Laparosopic Post Op HCI (Gross) Pt Education - Laparoscopic Cholecystectomy: gallbladder The anatomy & physiology of hepatobiliary & pancreatic function was discussed. The pathophysiology of gallbladder dysfunction was discussed. Natural history risks without surgery was discussed. I feel the risks of no intervention will lead to serious problems that outweigh the operative risks; therefore, I recommended cholecystectomy to remove the pathology. I explained laparoscopic techniques with possible need for an open approach. Probable cholangiogram to evaluate the bilary tract was explained as well.  Risks such as bleeding, infection, abscess, leak, injury to other organs, need for further treatment, heart attack, death, and other risks were discussed. I noted a good likelihood this will help address the problem. Possibility that this will not correct all abdominal symptoms was explained. Goals of post-operative recovery were discussed as well. We will work to minimize complications. An educational handout further explaining the pathology and treatment options was given as well. Questions were answered. The patient expresses understanding & wishes to proceed with surgery.

## 2019-06-09 NOTE — Transfer of Care (Signed)
Immediate Anesthesia Transfer of Care Note  Patient: Allen Robertson  Procedure(s) Performed: LAPAROSCOPIC CHOLECYSTECTOMY (N/A Abdomen)  Patient Location: PACU  Anesthesia Type:General  Level of Consciousness: awake, alert  and oriented  Airway & Oxygen Therapy: Patient Spontanous Breathing  Post-op Assessment: Report given to RN and Post -op Vital signs reviewed and stable  Post vital signs: Reviewed and stable  Last Vitals:  Vitals Value Taken Time  BP    Temp    Pulse 83 06/09/19 1031  Resp 17 06/09/19 1031  SpO2 97 % 06/09/19 1031  Vitals shown include unvalidated device data.  Last Pain:  Vitals:   06/09/19 0741  TempSrc: Oral  PainSc:       Patients Stated Pain Goal: 3 ([id number])  Complications: No apparent anesthesia complications

## 2019-06-10 ENCOUNTER — Encounter (HOSPITAL_COMMUNITY): Payer: Self-pay | Admitting: Surgery

## 2019-07-13 IMAGING — US ULTRASOUND ABDOMEN LIMITED
1 series · 14 of 25 positions shown · non-contrast
Comparison: None.

CLINICAL DATA: Symptomatic gallstones.  Chest pain.

EXAM:
ULTRASOUND ABDOMEN LIMITED RIGHT UPPER QUADRANT

[Series 1: ultrasound abdomen limited · 0.26mm/px · 14 of 44 slices shown]
[im 1/44]
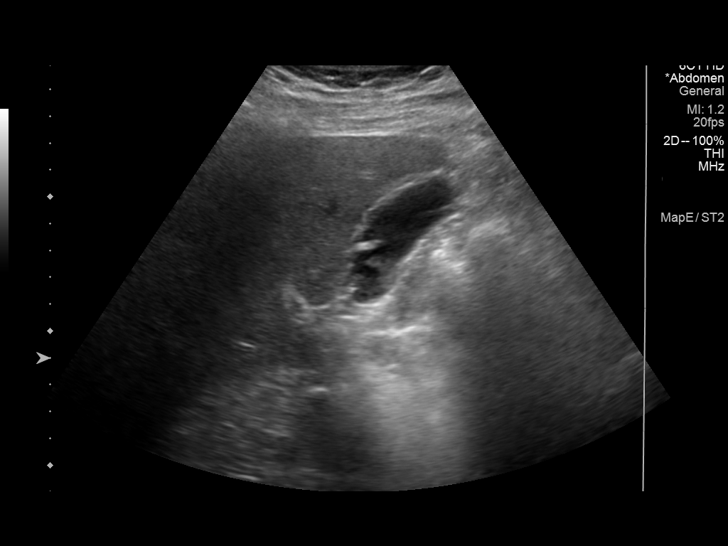
[im 4/44]
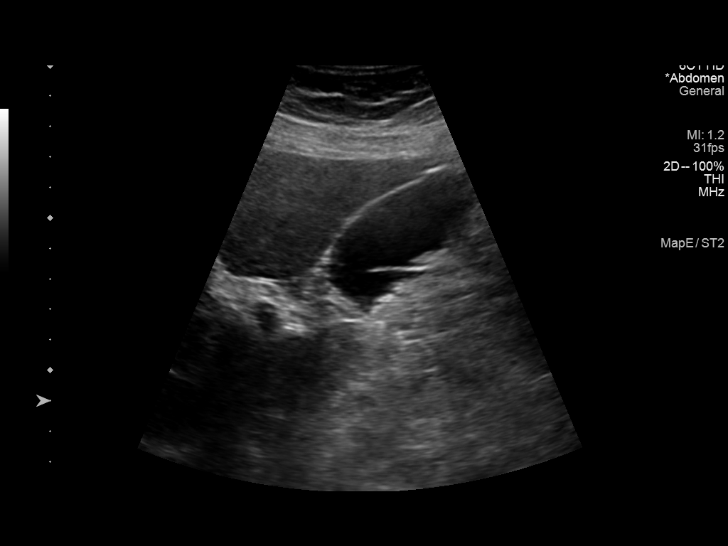
[im 8/44]
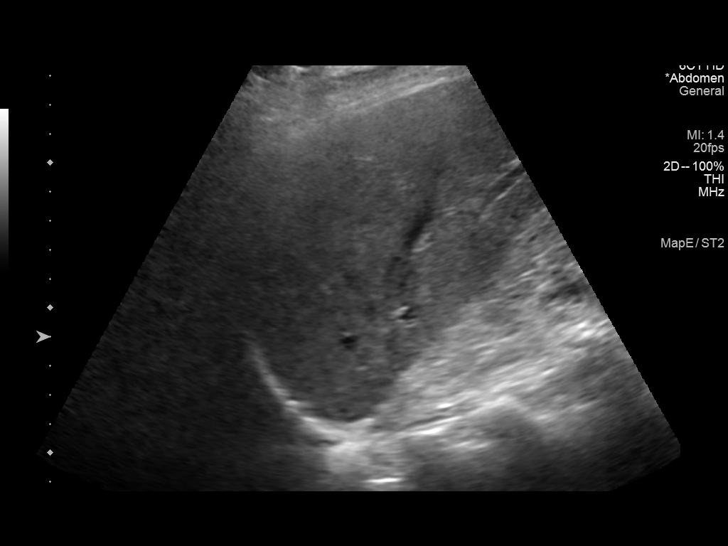
[im 11/44]
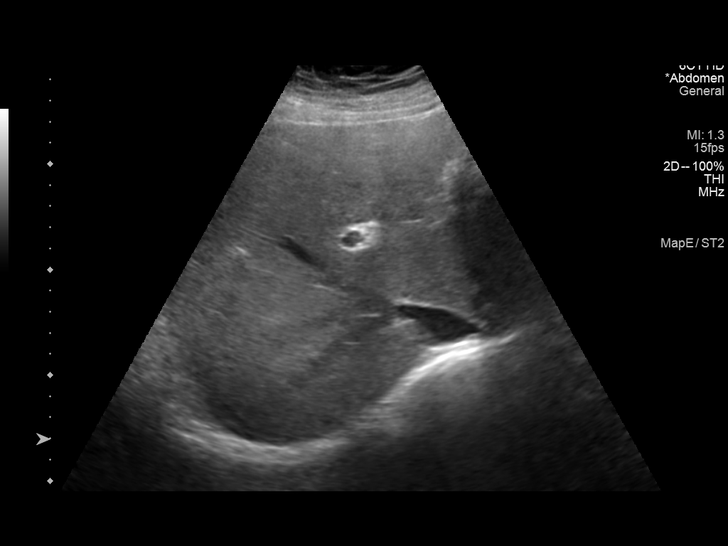
[im 15/44]
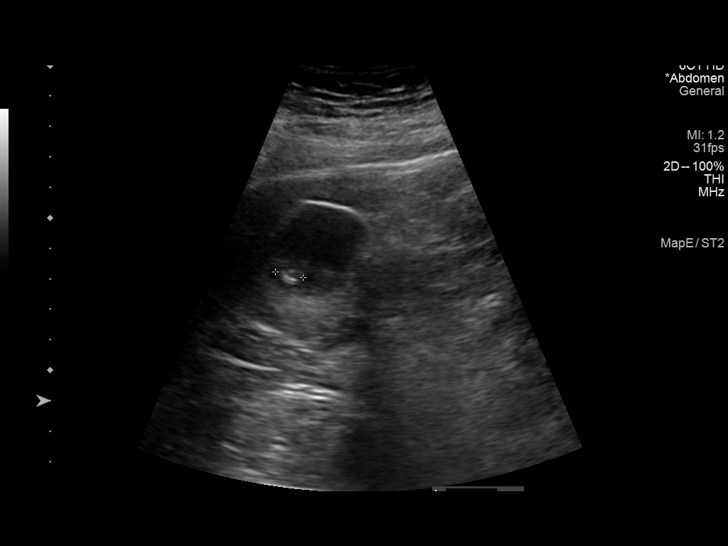
[im 17/44]
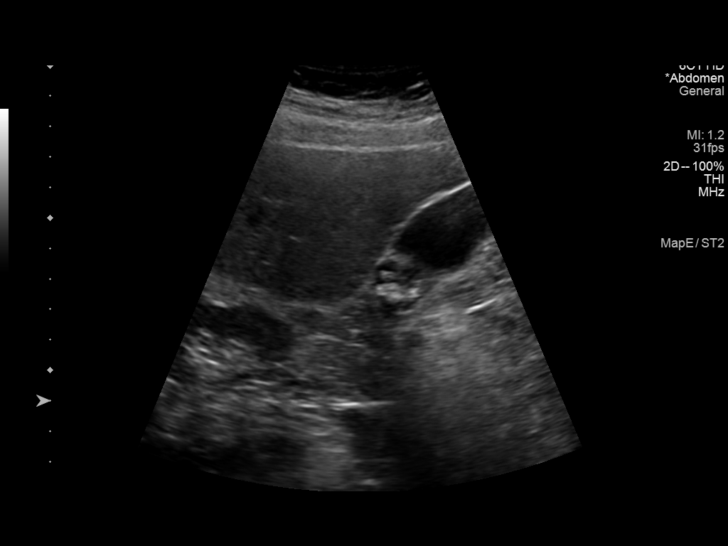
[im 20/44]
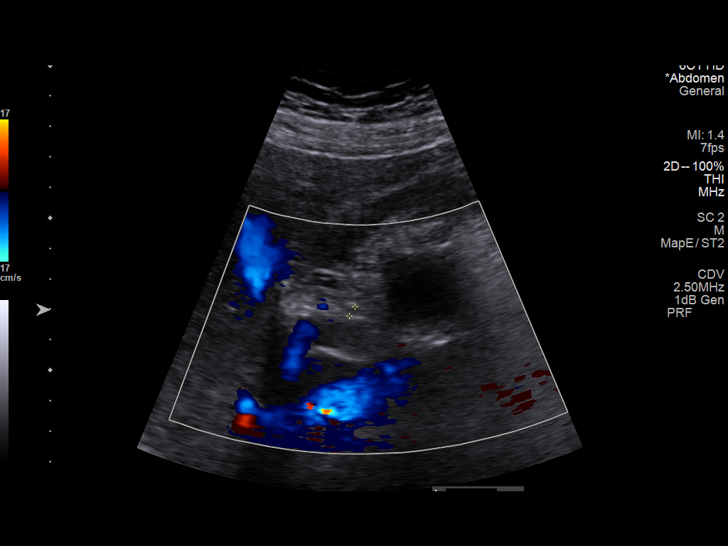
[im 24/44]
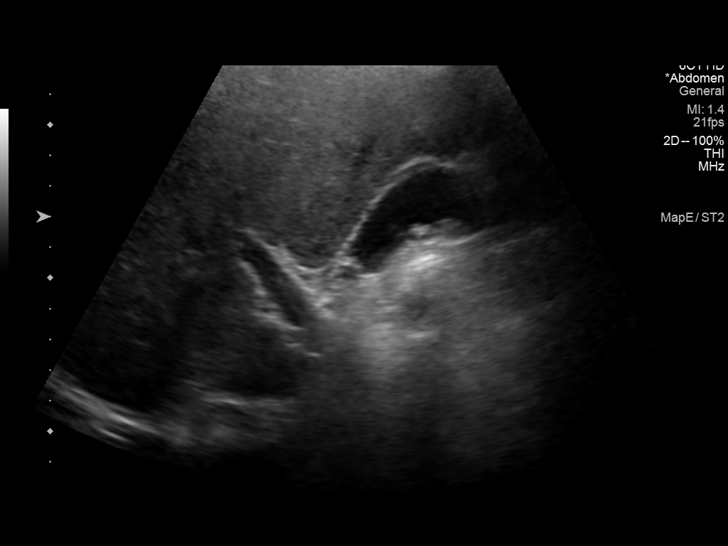
[im 27/44]
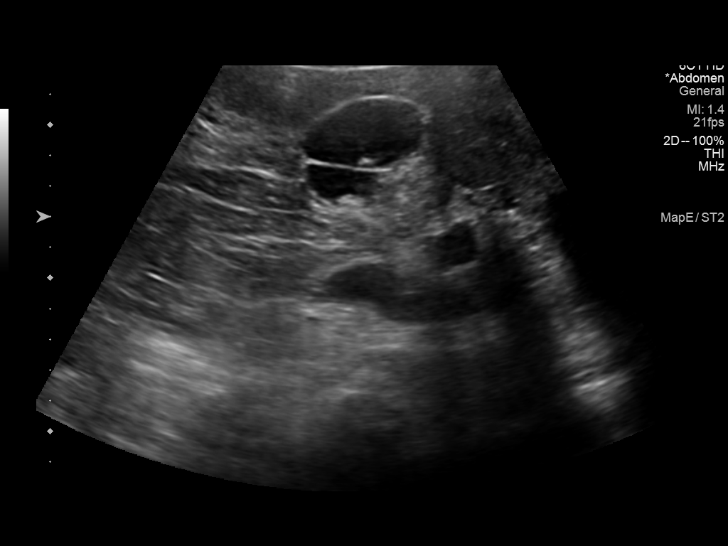
[im 29/44]
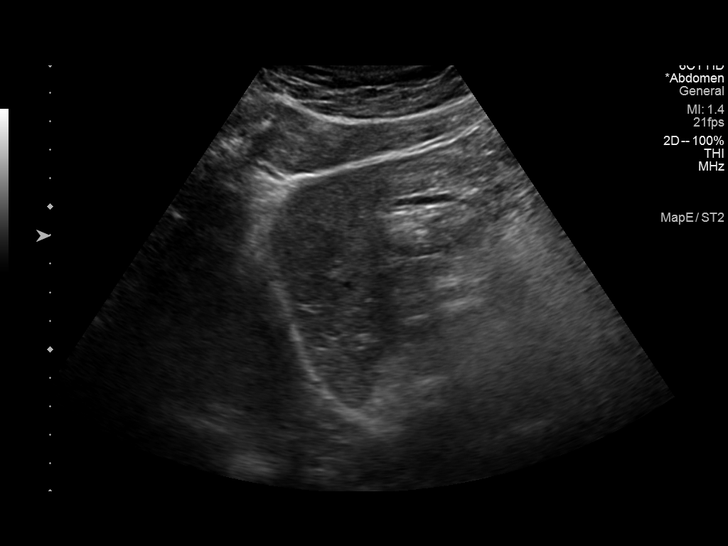
[im 33/44]
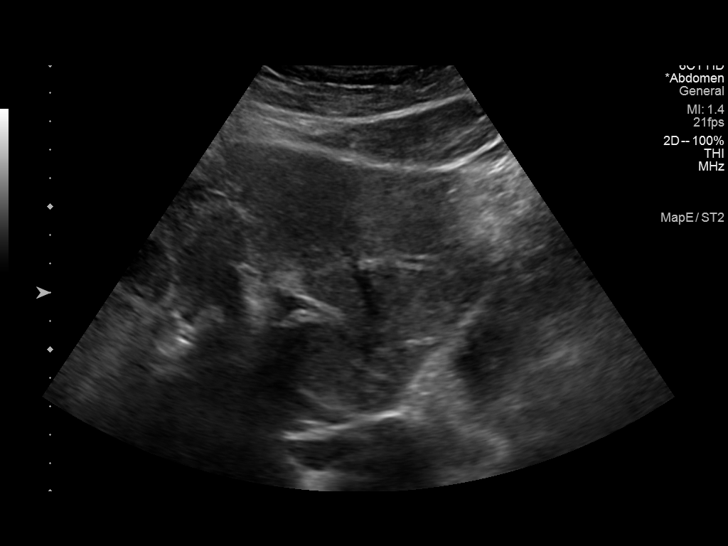
[im 36/44]
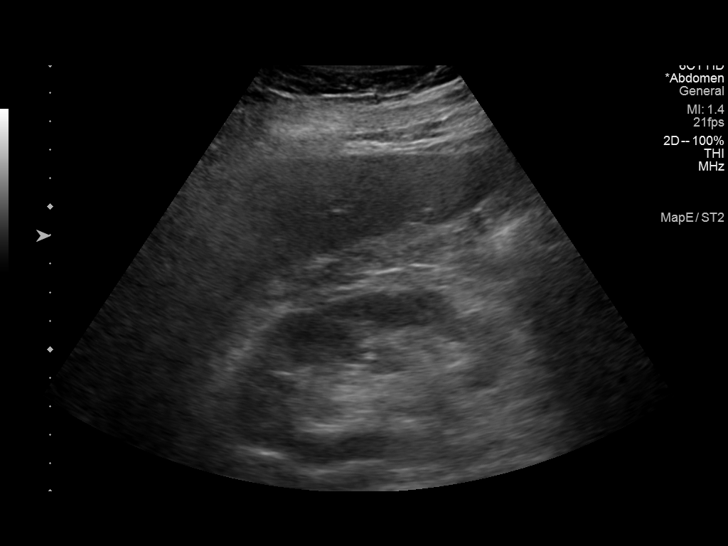
[im 40/44]
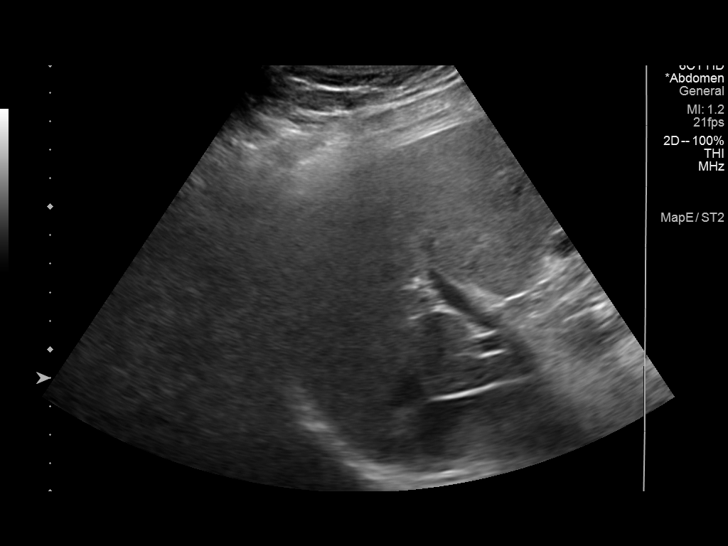
[im 44/44]
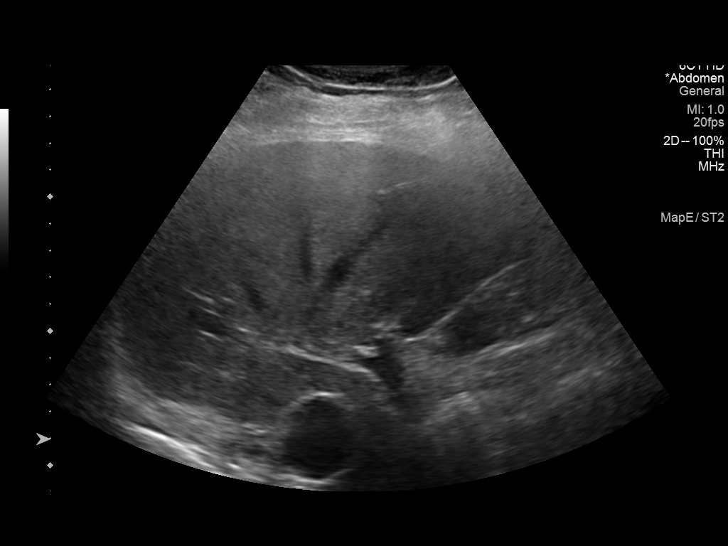

[14 of 25 positions shown; findings below may reference images not displayed]

FINDINGS: Gallbladder:

Multiple gallstones measuring up to 9 mm. No gallbladder wall
thickening. No sonographic Murphy sign noted by sonographer.

Common bile duct:

Diameter: 4 mm where visualized

Liver:

Borderline to mildly increased parenchymal echogenicity diffusely
without focal lesion identified. Portal vein is patent on color
Doppler imaging with normal direction of blood flow towards the
liver.
IMPRESSION: 1. Cholelithiasis without evidence of acute cholecystitis.
2. Possible mild hepatic steatosis.

## 2019-11-13 ENCOUNTER — Ambulatory Visit (INDEPENDENT_AMBULATORY_CARE_PROVIDER_SITE_OTHER): Payer: Medicare Other | Admitting: Gastroenterology

## 2019-11-13 ENCOUNTER — Encounter: Payer: Self-pay | Admitting: Gastroenterology

## 2019-11-13 VITALS — BP 110/78 | HR 80 | Temp 98.3°F | Ht 73.5 in | Wt 252.2 lb

## 2019-11-13 DIAGNOSIS — K219 Gastro-esophageal reflux disease without esophagitis: Secondary | ICD-10-CM

## 2019-11-13 DIAGNOSIS — R079 Chest pain, unspecified: Secondary | ICD-10-CM | POA: Diagnosis not present

## 2019-11-13 MED ORDER — OMEPRAZOLE 40 MG PO CPDR: 40.0000 mg | DELAYED_RELEASE_CAPSULE | Freq: Two times a day (BID) | ORAL | 11 refills | Status: DC

## 2019-11-13 NOTE — Progress Notes (Signed)
    History of Present Illness: This is a 69 year old male evaluated for intermittent chest pain in June 2020.  He underwent cardiac evaluation by Dr. Percival Spanish in April and May 2020.  A cardiac cath showed nonobstructive disease with a single 25% LAD stenosis and a normal EF. Ultrasound in July 2020 showed cholelithiasis without evidence of cholecystitis and a CBD at 4 mm.  He underwent cholecystectomy in August 2020.  He has been maintained on omeprazole 20 mg daily for GERD.  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   He relates he had no symptoms for about 2 months following his cholecystectomy however he has had a gradual return of intermittent chest pain which is not necessarily related to meals or activities. He has noted when bending over frequently and working in the yard he has had more problems with chest pain.  Tums has only been partially effective in controlling symptoms.  Current Medications, Allergies, Past Medical History, Past Surgical History, Family History and Social History were reviewed in [employer] record.   Physical Exam: General: Well developed, well nourished, no acute distress Head: Normocephalic and atraumatic Eyes:  sclerae anicteric, EOMI Ears: Normal auditory acuity Mouth: No deformity or lesions Lungs: Clear throughout to auscultation Heart: Regular rate and rhythm; no murmurs, rubs or bruits Abdomen: Soft, non tender and non distended. No masses, hepatosplenomegaly or hernias noted. Normal Bowel sounds Rectal: Not done Musculoskeletal: Symmetrical with no gross deformities  Pulses:  Normal pulses noted Extremities: No clubbing, cyanosis, edema or deformities noted Neurological: Alert oriented x 4, grossly nonfocal Psychological:  Alert and cooperative. Normal mood and affect   Assessment and Recommendations:  1. Chest pain, intermittent.  Symptoms are not clearly GI related.  Perhaps a combination of  chest wall pain and GERD.  Increase omeprazole to 40 mg p.o. twice daily taken 30 minutes before breakfast and dinner.  Follow antireflux measures.  Tums as needed.  If pain persists I advised gentle pain reliever such as Tylenol or Advil as directed for possible musculoskeletal chest symptoms. REV in 6 weeks.

## 2019-11-13 NOTE — Patient Instructions (Signed)
Increase your omeprazole to 40 mg twice daily. A new prescription has been sent to your pharmacy.   Patient advised to avoid spicy, acidic, citrus, chocolate, mints, fruit and fruit juices.  Limit the intake of caffeine, alcohol and Soda.  Don't exercise too soon after eating.  Don't lie down within 3-4 hours of eating.  Elevate the head of your bed.  You can take over the counter Tylenol or Advil for chest pain.   Normal BMI (Body Mass Index- based on height and weight) is between 23 and 30. Your BMI today is Body mass index is 32.83 kg/m. Marland Kitchen Please consider follow up  regarding your BMI with your Primary Care Provider.  Thank you for choosing me and [facility].  Pricilla Riffle. Dagoberto Ligas., MD., Marval Regal

## 2019-12-23 ENCOUNTER — Encounter: Payer: Self-pay | Admitting: Gastroenterology

## 2019-12-23 ENCOUNTER — Ambulatory Visit (INDEPENDENT_AMBULATORY_CARE_PROVIDER_SITE_OTHER): Payer: Medicare Other | Admitting: Gastroenterology

## 2019-12-23 VITALS — BP 110/68 | HR 84 | Temp 97.8°F | Ht 73.5 in | Wt 249.4 lb

## 2019-12-23 DIAGNOSIS — K219 Gastro-esophageal reflux disease without esophagitis: Secondary | ICD-10-CM

## 2019-12-23 DIAGNOSIS — R079 Chest pain, unspecified: Secondary | ICD-10-CM

## 2019-12-23 MED ORDER — PANTOPRAZOLE SODIUM 40 MG PO TBEC: 40.0000 mg | DELAYED_RELEASE_TABLET | Freq: Two times a day (BID) | ORAL | 3 refills | Status: DC

## 2019-12-23 NOTE — Patient Instructions (Signed)
1.Stop Omeprazole.  2.Start Pantoprazole 40mg  - twice daily.   You may use over the counter Gas-X or Gaviscon for gas and bloating.   You have been scheduled for an endoscopy. Please follow written instructions given to you at your visit today. If you use inhalers (even only as needed), please bring them with you on the day of your procedure.  If you are age 68 or older, your body mass index should be between 23-30. Your Body mass index is 32.46 kg/m. If this is out of the aforementioned range listed, please consider follow up with your Primary Care Provider.  Thank you for choosing me and [facility].  Dr. Fuller Plan

## 2019-12-23 NOTE — Progress Notes (Signed)
    History of Present Illness: This is a 69 year old male returning for follow-up of chest pain which was not clearly GI related.  At his last visit recommended increasing omeprazole to 40 mg twice daily.  He followed this advice however he developed cramping in his legs so he resumed 20 mg twice daily.  He did not feel the higher dose helped his chest pain. He underwent laparoscopic cholecystectomy for symptomatic cholelithiasis in August 2020.  He has noted several occasions where he has had increased intestinal gas associated with bloating and occasionally loose stool.  This seems to have occurred related to certain foods and has been infrequent.  This does not appear to be associated with his chest pain.  Occasionally the chest pain improves with Tums occasionally it does not respond.  Denies weight loss, abdominal pain, constipation, change in stool caliber, melena, hematochezia, nausea, vomiting, dysphagia.  Current Medications, Allergies, Past Medical History, Past Surgical History, Family History and Social History were reviewed in [employer] record.   Physical Exam: General: Well developed, well nourished, no acute distress Head: Normocephalic and atraumatic Eyes:  sclerae anicteric, EOMI Ears: Normal auditory acuity Mouth: Not examined, mask on during Covid-19 pandemic Lungs: Clear throughout to auscultation Chest: No anterior chest wall tenderness Heart: Regular rate and rhythm; no murmurs, rubs or bruits Abdomen: Soft, non tender and non distended. No masses, hepatosplenomegaly or hernias noted. Normal Bowel sounds Rectal: Not done Musculoskeletal: Symmetrical with no gross deformities  Pulses:  Normal pulses noted Extremities: No clubbing, cyanosis, edema or deformities noted Neurological: Alert oriented x 4, grossly nonfocal Psychological:  Alert and cooperative. Anxious   Assessment and Recommendations:  1.  Chest pain. Occasionally appears to be  reflux related however many times it does not appear to be reflux or GI related. See #2. CBC, CMP today.   2.  GERD. Leg cramps at higher dose omeprazole. R/O esophagitis, neoplasm. Change to pantoprazole 40 mg po bid. Follow antireflux measures. Schedule EGD. The risks (including bleeding, perforation, infection, missed lesions, medication reactions and possible hospitalization or surgery if complications occur), benefits, and alternatives to endoscopy with possible biopsy and possible dilation were discussed with the patient and they consent to proceed.   3.  Gas and bloating.  Avoid foods that exacerbate symptoms.  Gaviscon or Gas-X as needed.

## 2020-01-08 ENCOUNTER — Other Ambulatory Visit: Payer: Self-pay | Admitting: Gastroenterology

## 2020-01-08 ENCOUNTER — Ambulatory Visit (INDEPENDENT_AMBULATORY_CARE_PROVIDER_SITE_OTHER): Payer: Medicare Other

## 2020-01-08 ENCOUNTER — Other Ambulatory Visit: Payer: Self-pay

## 2020-01-08 DIAGNOSIS — Z1159 Encounter for screening for other viral diseases: Secondary | ICD-10-CM

## 2020-01-08 LAB — SARS CORONAVIRUS 2 (TAT 6-24 HRS): SARS Coronavirus 2: NEGATIVE

## 2020-01-11 ENCOUNTER — Encounter: Payer: Self-pay | Admitting: Gastroenterology

## 2020-01-11 ENCOUNTER — Other Ambulatory Visit: Payer: Self-pay

## 2020-01-11 ENCOUNTER — Ambulatory Visit (AMBULATORY_SURGERY_CENTER): Payer: Medicare Other | Admitting: Gastroenterology

## 2020-01-11 VITALS — BP 119/72 | HR 62 | Temp 96.8°F | Resp 18 | Ht 73.0 in | Wt 249.0 lb

## 2020-01-11 DIAGNOSIS — R079 Chest pain, unspecified: Secondary | ICD-10-CM

## 2020-01-11 DIAGNOSIS — K3189 Other diseases of stomach and duodenum: Secondary | ICD-10-CM

## 2020-01-11 DIAGNOSIS — K228 Other specified diseases of esophagus: Secondary | ICD-10-CM | POA: Diagnosis not present

## 2020-01-11 DIAGNOSIS — K219 Gastro-esophageal reflux disease without esophagitis: Secondary | ICD-10-CM

## 2020-01-11 DIAGNOSIS — K449 Diaphragmatic hernia without obstruction or gangrene: Secondary | ICD-10-CM | POA: Diagnosis not present

## 2020-01-11 MED ORDER — SODIUM CHLORIDE 0.9 % IV SOLN: 500.0000 mL | INTRAVENOUS | Status: DC

## 2020-01-11 NOTE — Progress Notes (Signed)
To PACU, VSS. Report to [email or web address].tb 

## 2020-01-11 NOTE — Progress Notes (Signed)
Called to room to assist during endoscopic procedure.  Patient ID and intended procedure confirmed with present staff. Received instructions for my participation in the procedure from the performing physician.  

## 2020-01-11 NOTE — Op Note (Signed)
[place] Patient Name: Allen Robertson Procedure Date: 01/11/2020 8:31 AM MRN: [id number] Endoscopist: Ladene Artist , MD Age: 69 Referring MD:  Date of Birth: 1951/03/13 Gender: Male Account #: [id number] Procedure:                Upper GI endoscopy Indications:              Gastroesophageal reflux disease, Chest pain (non                            cardiac) Medicines:                Monitored Anesthesia Care Procedure:                Pre-Anesthesia Assessment:                           - Prior to the procedure, a History and Physical                            was performed, and patient medications and                            allergies were reviewed. The patient's tolerance of                            previous anesthesia was also reviewed. The risks                            and benefits of the procedure and the sedation                            options and risks were discussed with the patient.                            All questions were answered, and informed consent                            was obtained. Prior Anticoagulants: The patient has                            taken no previous anticoagulant or antiplatelet                            agents. ASA Grade Assessment: II - A patient with                            mild systemic disease. After reviewing the risks                            and benefits, the patient was deemed in                            satisfactory condition to undergo the procedure.  After obtaining informed consent, the endoscope was                            passed under direct vision. Throughout the                            procedure, the patient's blood pressure, pulse, and                            oxygen saturations were monitored continuously. The                            Endoscope was introduced through the mouth, and                            advanced to the second part of duodenum. The upper                             GI endoscopy was accomplished without difficulty.                            The patient tolerated the procedure well. Scope In: Scope Out: Findings:                 Patchy mild erythema was found in the distal                            esophagus. Biopsies were taken with a cold forceps                            for histology.                           The exam of the esophagus was otherwise normal.                           Patchy moderately erythematous mucosa without                            bleeding was found in the gastric body and in the                            gastric antrum. Biopsies were taken with a cold                            forceps for histology.                           A small hiatal hernia was present.                           The exam of the stomach was otherwise normal.                           The duodenal bulb and  second portion of the                            duodenum were normal. Complications:            No immediate complications. Estimated Blood Loss:     Estimated blood loss was minimal. Impression:               - Erythema in the distal esophagus. Biopsied.                           - Erythematous mucosa in the gastric body and                            antrum. Biopsied.                           - Small hiatal hernia.                           - Normal duodenal bulb and second portion of the                            duodenum. Recommendation:           - Patient has a contact number available for                            emergencies. The signs and symptoms of potential                            delayed complications were discussed with the                            patient. Return to normal activities tomorrow.                            Written discharge instructions were provided to the                            patient.                           - Resume previous diet.                           - Follow  antireflux measures.                           - Continue present medications.                           - Await pathology results. Ladene Artist, MD 01/11/2020 8:56:41 AM This report has been signed electronically.

## 2020-01-11 NOTE — Patient Instructions (Signed)
Handout given for GERD and Hiatal Hernia.   YOU HAD AN ENDOSCOPIC PROCEDURE TODAY AT [facility]:   Refer to the procedure report that was given to you for any specific questions about what was found during the examination.  If the procedure report does not answer your questions, please call your gastroenterologist to clarify.  If you requested that your care partner not be given the details of your procedure findings, then the procedure report has been included in a sealed envelope for you to review at your convenience later.  YOU SHOULD EXPECT: Some feelings of bloating in the abdomen. Passage of more gas than usual.  Walking can help get rid of the air that was put into your GI tract during the procedure and reduce the bloating. If you had a lower endoscopy (such as a colonoscopy or flexible sigmoidoscopy) you may notice spotting of blood in your stool or on the toilet paper. If you underwent a bowel prep for your procedure, you may not have a normal bowel movement for a few days.  Please Note:  You might notice some irritation and congestion in your nose or some drainage.  This is from the oxygen used during your procedure.  There is no need for concern and it should clear up in a day or so.  SYMPTOMS TO REPORT IMMEDIATELY:   Following upper endoscopy (EGD)  Vomiting of blood or coffee ground material  New chest pain or pain under the shoulder blades  Painful or persistently difficult swallowing  New shortness of breath  Fever of 100F or higher  Black, tarry-looking stools  For urgent or emergent issues, a gastroenterologist can be reached at any hour by calling [phone]. Do not use MyChart messaging for urgent concerns.    DIET:  We do recommend a small meal at first, but then you may proceed to your regular diet.  Drink plenty of fluids but you should avoid alcoholic beverages for 24 hours.  ACTIVITY:  You should plan to take it easy for the rest of today and  you should NOT DRIVE or use heavy machinery until tomorrow (because of the sedation medicines used during the test).    FOLLOW UP: Our staff will call the number listed on your records 48-72 hours following your procedure to check on you and address any questions or concerns that you may have regarding the information given to you following your procedure. If we do not reach you, we will leave a message.  We will attempt to reach you two times.  During this call, we will ask if you have developed any symptoms of COVID 19. If you develop any symptoms (ie: fever, flu-like symptoms, shortness of breath, cough etc.) before then, please call [phone].  If you test positive for Covid 19 in the 2 weeks post procedure, please call and report this information to [place].    If any biopsies were taken you will be contacted by phone or by letter within the next 1-3 weeks.  Please call us at [phone] if you have not heard about the biopsies in 3 weeks.    SIGNATURES/CONFIDENTIALITY: You and/or your care partner have signed paperwork which will be entered into your electronic medical record.  These signatures attest to the fact that that the information above on your After Visit Summary has been reviewed and is understood.  Full responsibility of the confidentiality of this discharge information lies with you and/or your care-partner.

## 2020-01-13 ENCOUNTER — Encounter: Payer: Self-pay | Admitting: Gastroenterology

## 2020-01-13 ENCOUNTER — Telehealth: Payer: Self-pay | Admitting: *Deleted

## 2020-01-13 NOTE — Telephone Encounter (Signed)
  Follow up Call-  Call back number 01/11/2020 08/07/2017  Post procedure Call Back phone  # [phone]  Permission to leave phone message Yes Yes  Some recent data might be hidden     Patient questions:  Do you have a fever, pain , or abdominal swelling? No. Pain Score  0 *  Have you tolerated food without any problems? Yes.    Have you been able to return to your normal activities? Yes.    Do you have any questions about your discharge instructions: Diet   No. Medications  No. Follow up visit  No.  Do you have questions or concerns about your Care? No.  Actions: * If pain score is 4 or above: No action needed, pain <4.   1. Have you developed a fever since your procedure? no  2.   Have you had an respiratory symptoms (SOB or cough) since your procedure? no  3.   Have you tested positive for COVID 19 since your procedure no  4.   Have you had any family members/close contacts diagnosed with the COVID 19 since your procedure?  no   If yes to any of these questions please route to Joylene John, RN and Erenest Rasher, RN

## 2020-02-03 ENCOUNTER — Encounter: Payer: Self-pay | Admitting: Specialist

## 2020-02-03 ENCOUNTER — Ambulatory Visit (INDEPENDENT_AMBULATORY_CARE_PROVIDER_SITE_OTHER): Payer: Medicare Other | Admitting: Specialist

## 2020-02-03 ENCOUNTER — Ambulatory Visit: Payer: Self-pay

## 2020-02-03 ENCOUNTER — Other Ambulatory Visit: Payer: Self-pay

## 2020-02-03 VITALS — BP 128/85 | HR 64 | Ht 73.5 in | Wt 250.0 lb

## 2020-02-03 DIAGNOSIS — M4722 Other spondylosis with radiculopathy, cervical region: Secondary | ICD-10-CM | POA: Diagnosis not present

## 2020-02-03 DIAGNOSIS — M542 Cervicalgia: Secondary | ICD-10-CM

## 2020-02-03 DIAGNOSIS — M51369 Other intervertebral disc degeneration, lumbar region without mention of lumbar back pain or lower extremity pain: Secondary | ICD-10-CM

## 2020-02-03 DIAGNOSIS — S4991XS Unspecified injury of right shoulder and upper arm, sequela: Secondary | ICD-10-CM

## 2020-02-03 DIAGNOSIS — M4306 Spondylolysis, lumbar region: Secondary | ICD-10-CM

## 2020-02-03 DIAGNOSIS — M4316 Spondylolisthesis, lumbar region: Secondary | ICD-10-CM

## 2020-02-03 DIAGNOSIS — R29898 Other symptoms and signs involving the musculoskeletal system: Secondary | ICD-10-CM

## 2020-02-03 DIAGNOSIS — M4819 Ankylosing hyperostosis [Forestier], multiple sites in spine: Secondary | ICD-10-CM

## 2020-02-03 DIAGNOSIS — M5136 Other intervertebral disc degeneration, lumbar region: Secondary | ICD-10-CM

## 2020-02-03 MED ORDER — GABAPENTIN 300 MG PO CAPS: 300.0000 mg | ORAL_CAPSULE | Freq: Every day | ORAL | 3 refills | Status: DC

## 2020-02-03 MED ORDER — METHYLPREDNISOLONE 4 MG PO TABS: ORAL_TABLET | ORAL | 0 refills | Status: AC

## 2020-02-03 NOTE — Patient Instructions (Addendum)
Avoid overhead lifting and overhead use of the arms. Pillows to keep from sleeping directly on the shoulders Limited lifting to less than 10 lbs. Ice or heat for relief.  Do not use NSAIDs are helpful, such as alleve or motrin,  as they place burdens on the kidney and likely irritate the hiatal hernia and stomach. Stay on the omeprazole. Stretching exercise help and strengthening is helpful to build endurance. Avoid overhead lifting and overhead use of the arms. Do not lift greater than 5 lbs. Adjust head rest in vehicle to prevent hyperextension if rear ended. Take extra precautions to avoid falling. Gabapentin 300 mg po at night. Medrol 4mg  per day for 14 days.  2mg  po per day for 14 days then stop. Hemp CBD capsules, amazon.com 5,000-7,000 mg per bottle, 60 capsules per bottle, take one capsule twice a day.  Follow-Up Instructions: No follow-ups on file.

## 2020-02-03 NOTE — Progress Notes (Signed)
Office Visit Note   Patient: Allen Robertson           Date of Birth: [birth year]           MRN: [id number] Visit Date: 02/03/2020              Requested by: Ivan Anchors, MD [address],  [place] [address] PCP: Ivan Anchors, MD   Assessment & Plan: Visit Diagnoses:  1. Lumbar degenerative disc disease   2. Cervicalgia   3. Forestier's disease of multiple sites   4. Other spondylosis with radiculopathy, cervical region   5. Degenerative lumbar disc   6. Spondylolysis, lumbar region   7. Spondylolisthesis, lumbar region   8. Acromioclavicular (AC) joint injury, right, sequela   9. Weakness of shoulder     Plan: Avoid overhead lifting and overhead use of the arms. Pillows to keep from sleeping directly on the shoulders Limited lifting to less than 10 lbs. Ice or heat for relief.  Do not use NSAIDs are helpful, such as alleve or motrin,  as they place burdens on the kidney and likely irritate the hiatal hernia and stomach. Stay on the omeprazole. Stretching exercise help and strengthening is helpful to build endurance. Avoid overhead lifting and overhead use of the arms. Do not lift greater than 5 lbs. Adjust head rest in vehicle to prevent hyperextension if rear ended. Take extra precautions to avoid falling. Gabapentin 300 mg po at night. Medrol 4mg  per day for 14 days.  2mg  po per day for 14 days then stop. Hemp CBD capsules, amazon.com 5,000-7,000 mg per bottle, 60 capsules per bottle, take one capsule twice a day.  Follow-Up Instructions: Return in about 3 weeks (around 02/24/2020).   Orders:  Orders Placed This Encounter  Procedures  . XR Lumbar Spine 2-3 Views  . XR Cervical Spine 2 or 3 views   No orders of the defined types were placed in this encounter.     Procedures: No procedures performed   Clinical Data: No additional findings.   Subjective: Chief Complaint  Patient presents with  . Neck - Pain  . Lower Back - Pain    69  year old male with history of previous right TKR and right medial knee subluxation, previous left shoulder dislocations and post left shoulder  Bristow procedure while in the [employer]. He has had increasing neck and bilateral shoulder pain with pain radiation between the shoulder blades. Has weakness left shoulder than is progressing over the past few years. He has a difficulty time performing over head use of the arms. He has pain into the shoulders and down the lateral upper arms, no hand numbness or weakness. Not dropping items. Does notice stiffness in the neck and some popping in the shoulder. He has pain also in the mid lumbar spine that is worse with lifting and pulling and repeatitive work but is able to golf. Can walk up to 3 miles per day. He is not able play 27-36 holes he stays at 18 holes. There is some  Night pain between the shoulders and in the hip. He takes [place] to assist with sleep. No bowel or bladder difficulty.    Review of Systems  Constitutional: Negative.  Negative for activity change, appetite change, chills, diaphoresis, fatigue, fever and unexpected weight change.  HENT: Positive for congestion, rhinorrhea, sinus pressure, sinus pain and sneezing. Negative for dental problem, drooling, ear discharge, ear pain, facial swelling, hearing loss, mouth sores, nosebleeds,  postnasal drip, sore throat, tinnitus, trouble swallowing and voice change.   Eyes: Negative.  Negative for photophobia, pain, discharge, redness, itching and visual disturbance (macular degeneration).  Respiratory: Negative.  Negative for apnea, cough, choking, chest tightness, shortness of breath, wheezing and stridor.   Cardiovascular: Positive for chest pain. Negative for palpitations and leg swelling.  Gastrointestinal: Positive for abdominal pain (reflux, previous cholecystectomy). Negative for abdominal distention, anal bleeding, blood in stool, constipation, diarrhea, nausea, rectal pain and vomiting.    Endocrine: Negative.  Negative for cold intolerance, heat intolerance, polydipsia, polyphagia and polyuria.  Genitourinary: Negative.  Negative for difficulty urinating, dysuria, enuresis, flank pain, frequency, genital sores and hematuria.  Musculoskeletal: Positive for arthralgias, back pain, neck pain and neck stiffness. Negative for gait problem, joint swelling and myalgias.  Skin: Negative.  Negative for color change, pallor, rash and wound.  Allergic/Immunologic: Negative for environmental allergies, food allergies and immunocompromised state.  Neurological: Negative.  Negative for dizziness, tremors, seizures, syncope, facial asymmetry, speech difficulty, weakness, light-headedness, numbness and headaches.  Hematological: Negative.  Negative for adenopathy. Does not bruise/bleed easily.  Psychiatric/Behavioral: Negative.  Negative for agitation, behavioral problems, confusion, decreased concentration, dysphoric mood, hallucinations, self-injury, sleep disturbance and suicidal ideas. The patient is not nervous/anxious and is not hyperactive.      Objective: Vital Signs: BP 128/85 (BP Location: Left Arm, Patient Position: Sitting)   Pulse 64   Ht 6' 1.5" (1.867 m)   Wt 250 lb (113.4 kg)   BMI 32.54 kg/m   Physical Exam  Ortho Exam  Specialty Comments:  No specialty comments available.  Imaging: No results found.   PMFS History: Patient Active Problem List   Diagnosis Date Noted  . Educated about COVID-19 virus infection 03/13/2019  . Lumbar degenerative disc disease 11/26/2017  . Greater trochanteric bursitis, left 10/29/2017  . Chest pain of uncertain etiology [id number]  . Hyperlipidemia 05/28/2017  . GERD (gastroesophageal reflux disease) 05/28/2017  . Abnormal EKG 05/28/2017  . Adiposity 09/13/2015  . 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 History of knee surgery 09/12/2015  . History of artificial joint 09/12/2015  . Foreign body (FB) in soft tissue  07/08/2014  . Lesion of soft tissue 07/08/2014  . Joint pain 04/22/2014  . Soft tissue disorder 04/22/2014  . Groin injury 03/31/2013  . Pubic bone pain 03/31/2013  . History of arthroplasty 04/11/2012  . 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 Cancer of tongue ([place] [facility]) 11/23/2011  . Tongue mass 11/07/2011  . Snoring 10/19/2011  . Varicose veins 10/19/2011   Past Medical History:  Diagnosis Date  . Cancer ([place]) 2013   tongue  . Fracture of left clavicle    2000 / also had infection had to have IV infusion therapy   . GERD (gastroesophageal reflux disease)   . Headache    prescribed metoprolol for HA management  . History of chicken pox   . History of kidney stones   . Hyperlipidemia   . Recurrent falls   . Staph skin infection   . Tinnitus   . Varicose veins     Family History  Problem Relation Age of Onset  . Hypertension Mother   . Dementia Mother   . Leukemia Father   . Headache Father   . Other Father        liver aneurysm  . Colon cancer Maternal Grandmother 38  . Esophageal cancer Neg Hx   . Pancreatic cancer Neg Hx   .  Stomach cancer Neg Hx   . Liver disease Neg Hx     Past Surgical History:  Procedure Laterality Date  . ablation calf     2009  . BRISTOW PROCEDURE     left shoulder   . CHOLECYSTECTOMY N/A 06/09/2019   Procedure: LAPAROSCOPIC CHOLECYSTECTOMY;  Surgeon: Erroll Luna, MD;  Location: [facility];  Service: General;  Laterality: N/A;  . I & D EXTREMITY     right lower leg   . JOINT REPLACEMENT    . LEFT HEART CATH AND CORONARY ANGIOGRAPHY N/A 02/27/2019   Procedure: LEFT HEART CATH AND CORONARY ANGIOGRAPHY;  Surgeon: Troy Sine, MD;  Location: [facility];  Service: Cardiovascular;  Laterality: N/A;  . LEG SURGERY Right   . LITHOTRIPSY    . NASAL TURBINATE REDUCTION Bilateral    with deviated septum repair  . PICC LINE PLACE PERIPHERAL ([place] [facility])     history of / secondary to right lower leg infection   .  SHOULDER ARTHROSCOPY Left   . TONGUE SURGERY     robotic assisted with bilateral neck dissection 11/2011  . TOTAL KNEE ARTHROPLASTY Right    partial  . TOTAL KNEE REVISION Right 09/12/2015   Procedure: RIGHT TOTAL KNEE REVISION;  Surgeon: Paralee Cancel, MD;  Location: [facility];  Service: Orthopedics;  Laterality: Right;   Social History   Occupational History  . Occupation: Retired  Tobacco Use  . Smoking status: Former Smoker    Packs/day: 1.00    Years: 9.00    Pack years: 9.00    Types: Cigarettes    Quit date: 10/15/1980    Years since quitting: 39.3  . Smokeless tobacco: Former Systems developer    Types: [place] date: 10/16/2007  Substance and Sexual Activity  . Alcohol use: Yes    Alcohol/week: 12.0 standard drinks    Types: 12 Cans of beer per week  . Drug use: No  . Sexual activity: Not on file

## 2020-02-26 ENCOUNTER — Other Ambulatory Visit: Payer: Self-pay

## 2020-02-26 ENCOUNTER — Encounter: Payer: Self-pay | Admitting: Physical Medicine and Rehabilitation

## 2020-02-26 ENCOUNTER — Ambulatory Visit (INDEPENDENT_AMBULATORY_CARE_PROVIDER_SITE_OTHER): Payer: Medicare Other | Admitting: Physical Medicine and Rehabilitation

## 2020-02-26 DIAGNOSIS — R202 Paresthesia of skin: Secondary | ICD-10-CM | POA: Diagnosis not present

## 2020-02-26 NOTE — Progress Notes (Signed)
Pt is here for a NCS and states pain and numbness in both shoulders, both arms, and both hands (center of palm). Pt states symptoms started awhile ago and has gotten worse in the past few months. Pt states lifting and holding items makes it worse. Pt states gabapentin helps with symptoms. Right hand dominant. Pt hands washed.   .Numeric Pain Rating Scale and Functional Assessment Average Pain 7   In the last MONTH (on 0-10 scale) has pain interfered with the following?  1. General activity like being  able to carry out your everyday physical activities such as walking, climbing stairs, carrying groceries, or moving a chair?  Rating(8)

## 2020-02-29 NOTE — Progress Notes (Signed)
Allen Robertson - 69 y.o. male MRN [id number]  Date of birth: 02-10-1951  Office Visit Note: Visit Date: 02/26/2020 PCP: Ivan Anchors, MD Referred by: Ivan Anchors, MD  Subjective: Chief Complaint  Patient presents with  . Right Shoulder - Pain, Numbness  . Left Shoulder - Pain, Numbness  . Right Arm - Pain, Numbness  . Left Arm - Numbness, Pain   HPI:  Allen Robertson is a 69 y.o. male who comes in today At the request of Dr. Jeneen Rinks For electrodiagnostic study of both upper limbs.  Patient is right-hand dominant with chronic worsening several months history of pain with numbness in both shoulders both arms and both hands particularly in the center palmar aspect of the hands but also somewhat into the thumb and first digit more than the medial digits.  He reports worsening with lifting and holding items.  He says gabapentin seems to have been helping to some degree.  No prior electrodiagnostic study.  No history of diabetes.  Dr. Louanne Skye felt that there may be some component of the cervical radiculopathy.  No MRI studies for review.  ROS Otherwise per HPI.  Assessment & Plan: Visit Diagnoses:  1. Paresthesia of skin     Plan: Impression: The above electrodiagnostic study is ABNORMAL and reveals evidence of a mild bilateral median nerve entrapment at the wrist (carpal tunnel syndrome) affecting sensory components.   There is no significant electrodiagnostic evidence of any other focal nerve entrapment, brachial plexopathy or cervical radiculopathy.  **As you know, this particular electrodiagnostic study cannot rule out chemical radiculitis or sensory only radiculopathy.  Recommendations: 1.  Follow-up with referring physician. 2.  Continue current management of symptoms.  Meds & Orders: No orders of the defined types were placed in this encounter.   Orders Placed This Encounter  Procedures  . NCV with EMG (electromyography)    Follow-up: Return for Basil Dess, MD.    Procedures: No procedures performed  EMG & NCV Findings: Evaluation of the left ulnar motor nerve showed decreased conduction velocity (B Elbow-Wrist, 51 m/s).  The left median (across palm) sensory nerve showed prolonged distal peak latency (Wrist, 4.2 ms).  The right median (across palm) sensory nerve showed prolonged distal peak latency (Wrist, 4.2 ms) and prolonged distal peak latency (Palm, 2.5 ms).  The left ulnar sensory and the right ulnar sensory nerves showed prolonged distal peak latency (L3.8, R3.8 ms) and decreased conduction velocity (Wrist-5th Digit, L37, R37 m/s).  All remaining nerves (as indicated in the following tables) were within normal limits.  Left vs. Right side comparison data for the ulnar motor nerve indicates abnormal L-R amplitude difference (31.3 %).  All remaining left vs. right side differences were within normal limits.    All examined muscles (as indicated in the following table) showed no evidence of electrical instability.    Impression: The above electrodiagnostic study is ABNORMAL and reveals evidence of a mild bilateral median nerve entrapment at the wrist (carpal tunnel syndrome) affecting sensory components.   There is no significant electrodiagnostic evidence of any other focal nerve entrapment, brachial plexopathy or cervical radiculopathy.  **As you know, this particular electrodiagnostic study cannot rule out chemical radiculitis or sensory only radiculopathy.  Recommendations: 1.  Follow-up with referring physician. 2.  Continue current management of symptoms.  ___________________________ Wonda Olds Board Certified, American Board of Physical Medicine and Rehabilitation    Nerve Conduction Studies Anti Sensory Summary Table   Stim Site NR Peak (ms)  Norm Peak (ms) P-T Amp (V) Norm P-T Amp Site1 Site2 Delta-P (ms) Dist (cm) Vel (m/s) Norm Vel (m/s)  Left Median Acr Palm Anti Sensory (2nd Digit)  30.1C  Wrist    *4.2 <3.6 17.1 >10  Wrist Palm 2.2 0.0    Palm    2.0 <2.0 16.0         Right Median Acr Palm Anti Sensory (2nd Digit)  29.8C  Wrist    *4.2 <3.6 19.8 >10 Wrist Palm 1.7 0.0    Palm    *2.5 <2.0 21.6         Left Radial Anti Sensory (Base 1st Digit)  30.3C  Wrist    2.4 <3.1 33.1  Wrist Base 1st Digit 2.4 0.0    Right Radial Anti Sensory (Base 1st Digit)  30.1C  Wrist    2.5 <3.1 28.2  Wrist Base 1st Digit 2.5 0.0    Left Ulnar Anti Sensory (5th Digit)  30.3C  Wrist    *3.8 <3.7 20.2 >15.0 Wrist 5th Digit 3.8 14.0 *37 >38  Right Ulnar Anti Sensory (5th Digit)  30.3C  Wrist    *3.8 <3.7 18.9 >15.0 Wrist 5th Digit 3.8 14.0 *37 >38   Motor Summary Table   Stim Site NR Onset (ms) Norm Onset (ms) O-P Amp (mV) Norm O-P Amp Site1 Site2 Delta-0 (ms) Dist (cm) Vel (m/s) Norm Vel (m/s)  Left Median Motor (Abd Poll Brev)  30.3C  Wrist    3.8 <4.2 8.2 >5 Elbow Wrist 4.8 24.5 51 >50  Elbow    8.6  5.1         Right Median Motor (Abd Poll Brev)  30.3C  Wrist    3.9 <4.2 7.2 >5 Elbow Wrist 4.9 24.5 50 >50  Elbow    8.8  6.7         Left Ulnar Motor (Abd Dig Min)  30.3C  Wrist    3.4 <4.2 6.8 >3 B Elbow Wrist 4.6 23.5 *51 >53  B Elbow    8.0  5.2  A Elbow B Elbow 1.5 10.0 67 >53  A Elbow    9.5  5.0         Right Ulnar Motor (Abd Dig Min)  30.4C  Wrist    3.2 <4.2 9.9 >3 B Elbow Wrist 4.5 26.0 58 >53  B Elbow    7.7  9.3  A Elbow B Elbow 1.9 10.0 53 >53  A Elbow    9.6  7.2          EMG   Side Muscle Nerve Root Ins Act Fibs Psw Amp Dur Poly Recrt Int Fraser Din Comment  Right 1stDorInt Ulnar C8-T1 Nml Nml Nml Nml Nml 0 Nml Nml   Right Abd Poll Brev Median C8-T1 Nml Nml Nml Nml Nml 0 Nml Nml   Right ExtDigCom   Nml Nml Nml Nml Nml 0 Nml Nml   Right Triceps Radial C6-7-8 Nml Nml Nml Nml Nml 0 Nml Nml   Right Deltoid Axillary C5-6 Nml Nml Nml Nml Nml 0 Nml Nml   Left 1stDorInt Ulnar C8-T1 Nml Nml Nml Nml Nml 0 Nml Nml   Left Abd Poll Brev Median C8-T1 Nml Nml Nml Nml Nml 0 Nml Nml   Left ExtDigCom   Nml Nml Nml  Nml Nml 0 Nml Nml   Left Triceps Radial C6-7-8 Nml Nml Nml Nml Nml 0 Nml Nml   Left Deltoid Axillary C5-6 Nml Nml Nml Nml Nml 0 Nml Nml  Nerve Conduction Studies Anti Sensory Left/Right Comparison   Stim Site L Lat (ms) R Lat (ms) L-R Lat (ms) L Amp (V) R Amp (V) L-R Amp (%) Site1 Site2 L Vel (m/s) R Vel (m/s) L-R Vel (m/s)  Median Acr Palm Anti Sensory (2nd Digit)  30.1C  Wrist *4.2 *4.2 0.0 17.1 19.8 13.6 Wrist Palm     Palm 2.0 *2.5 0.5 16.0 21.6 25.9       Radial Anti Sensory (Base 1st Digit)  30.3C  Wrist 2.4 2.5 0.1 33.1 28.2 14.8 Wrist Base 1st Digit     Ulnar Anti Sensory (5th Digit)  30.3C  Wrist *3.8 *3.8 0.0 20.2 18.9 6.4 Wrist 5th Digit *37 *37 0   Motor Left/Right Comparison   Stim Site L Lat (ms) R Lat (ms) L-R Lat (ms) L Amp (mV) R Amp (mV) L-R Amp (%) Site1 Site2 L Vel (m/s) R Vel (m/s) L-R Vel (m/s)  Median Motor (Abd Poll Brev)  30.3C  Wrist 3.8 3.9 0.1 8.2 7.2 12.2 Elbow Wrist 51 50 1  Elbow 8.6 8.8 0.2 5.1 6.7 23.9       Ulnar Motor (Abd Dig Min)  30.3C  Wrist 3.4 3.2 0.2 6.8 9.9 *31.3 B Elbow Wrist *51 58 7  B Elbow 8.0 7.7 0.3 5.2 9.3 44.1 A Elbow B Elbow 67 53 14  A Elbow 9.5 9.6 0.1 5.0 7.2 30.6          Waveforms:                      Clinical History: No specialty comments available.     Objective:  VS:  HT:    WT:   BMI:     BP:   HR: bpm  TEMP: ( )  RESP:  Physical Exam Musculoskeletal:        General: No tenderness.     Comments: Inspection reveals no atrophy of the bilateral APB or FDI or hand intrinsics. There is no swelling, color changes, allodynia or dystrophic changes. There is 5 out of 5 strength in the bilateral wrist extension, finger abduction and long finger flexion. There is intact sensation to light touch in all dermatomal and peripheral nerve distributions.  There is a equivocally positive Phalen's test bilaterally. There is a negative Hoffmann's test bilaterally.  Skin:    General: Skin is warm and dry.      Findings: No erythema or rash.  Neurological:     General: No focal deficit present.     Mental Status: He is alert and oriented to person, place, and time.     Sensory: No sensory deficit.     Motor: No weakness or abnormal muscle tone.     Coordination: Coordination normal.     Gait: Gait normal.  Psychiatric:        Mood and Affect: Mood normal.        Behavior: Behavior normal.        Thought Content: Thought content normal.      Imaging: No results found.

## 2020-02-29 NOTE — Procedures (Signed)
EMG & NCV Findings: Evaluation of the left ulnar motor nerve showed decreased conduction velocity (B Elbow-Wrist, 51 m/s).  The left median (across palm) sensory nerve showed prolonged distal peak latency (Wrist, 4.2 ms).  The right median (across palm) sensory nerve showed prolonged distal peak latency (Wrist, 4.2 ms) and prolonged distal peak latency (Palm, 2.5 ms).  The left ulnar sensory and the right ulnar sensory nerves showed prolonged distal peak latency (L3.8, R3.8 ms) and decreased conduction velocity (Wrist-5th Digit, L37, R37 m/s).  All remaining nerves (as indicated in the following tables) were within normal limits.  Left vs. Right side comparison data for the ulnar motor nerve indicates abnormal L-R amplitude difference (31.3 %).  All remaining left vs. right side differences were within normal limits.    All examined muscles (as indicated in the following table) showed no evidence of electrical instability.    Impression: The above electrodiagnostic study is ABNORMAL and reveals evidence of a mild bilateral median nerve entrapment at the wrist (carpal tunnel syndrome) affecting sensory components.   There is no significant electrodiagnostic evidence of any other focal nerve entrapment, brachial plexopathy or cervical radiculopathy.  **As you know, this particular electrodiagnostic study cannot rule out chemical radiculitis or sensory only radiculopathy.  Recommendations: 1.  Follow-up with referring physician. 2.  Continue current management of symptoms.  ___________________________ Laurence Spates FAAPMR Board Certified, American Board of Physical Medicine and Rehabilitation    Nerve Conduction Studies Anti Sensory Summary Table   Stim Site NR Peak (ms) Norm Peak (ms) P-T Amp (V) Norm P-T Amp Site1 Site2 Delta-P (ms) Dist (cm) Vel (m/s) Norm Vel (m/s)  Left Median Acr Palm Anti Sensory (2nd Digit)  30.1C  Wrist    *4.2 <3.6 17.1 >10 Wrist Palm 2.2 0.0    Palm    2.0 <2.0 16.0          Right Median Acr Palm Anti Sensory (2nd Digit)  29.8C  Wrist    *4.2 <3.6 19.8 >10 Wrist Palm 1.7 0.0    Palm    *2.5 <2.0 21.6         Left Radial Anti Sensory (Base 1st Digit)  30.3C  Wrist    2.4 <3.1 33.1  Wrist Base 1st Digit 2.4 0.0    Right Radial Anti Sensory (Base 1st Digit)  30.1C  Wrist    2.5 <3.1 28.2  Wrist Base 1st Digit 2.5 0.0    Left Ulnar Anti Sensory (5th Digit)  30.3C  Wrist    *3.8 <3.7 20.2 >15.0 Wrist 5th Digit 3.8 14.0 *37 >38  Right Ulnar Anti Sensory (5th Digit)  30.3C  Wrist    *3.8 <3.7 18.9 >15.0 Wrist 5th Digit 3.8 14.0 *37 >38   Motor Summary Table   Stim Site NR Onset (ms) Norm Onset (ms) O-P Amp (mV) Norm O-P Amp Site1 Site2 Delta-0 (ms) Dist (cm) Vel (m/s) Norm Vel (m/s)  Left Median Motor (Abd Poll Brev)  30.3C  Wrist    3.8 <4.2 8.2 >5 Elbow Wrist 4.8 24.5 51 >50  Elbow    8.6  5.1         Right Median Motor (Abd Poll Brev)  30.3C  Wrist    3.9 <4.2 7.2 >5 Elbow Wrist 4.9 24.5 50 >50  Elbow    8.8  6.7         Left Ulnar Motor (Abd Dig Min)  30.3C  Wrist    3.4 <4.2 6.8 >3 B Elbow Wrist 4.6  23.5 *51 >53  B Elbow    8.0  5.2  A Elbow B Elbow 1.5 10.0 67 >53  A Elbow    9.5  5.0         Right Ulnar Motor (Abd Dig Min)  30.4C  Wrist    3.2 <4.2 9.9 >3 B Elbow Wrist 4.5 26.0 58 >53  B Elbow    7.7  9.3  A Elbow B Elbow 1.9 10.0 53 >53  A Elbow    9.6  7.2          EMG   Side Muscle Nerve Root Ins Act Fibs Psw Amp Dur Poly Recrt Int Fraser Din Comment  Right 1stDorInt Ulnar C8-T1 Nml Nml Nml Nml Nml 0 Nml Nml   Right Abd Poll Brev Median C8-T1 Nml Nml Nml Nml Nml 0 Nml Nml   Right ExtDigCom   Nml Nml Nml Nml Nml 0 Nml Nml   Right Triceps Radial C6-7-8 Nml Nml Nml Nml Nml 0 Nml Nml   Right Deltoid Axillary C5-6 Nml Nml Nml Nml Nml 0 Nml Nml   Left 1stDorInt Ulnar C8-T1 Nml Nml Nml Nml Nml 0 Nml Nml   Left Abd Poll Brev Median C8-T1 Nml Nml Nml Nml Nml 0 Nml Nml   Left ExtDigCom   Nml Nml Nml Nml Nml 0 Nml Nml   Left Triceps Radial  C6-7-8 Nml Nml Nml Nml Nml 0 Nml Nml   Left Deltoid Axillary C5-6 Nml Nml Nml Nml Nml 0 Nml Nml     Nerve Conduction Studies Anti Sensory Left/Right Comparison   Stim Site L Lat (ms) R Lat (ms) L-R Lat (ms) L Amp (V) R Amp (V) L-R Amp (%) Site1 Site2 L Vel (m/s) R Vel (m/s) L-R Vel (m/s)  Median Acr Palm Anti Sensory (2nd Digit)  30.1C  Wrist *4.2 *4.2 0.0 17.1 19.8 13.6 Wrist Palm     Palm 2.0 *2.5 0.5 16.0 21.6 25.9       Radial Anti Sensory (Base 1st Digit)  30.3C  Wrist 2.4 2.5 0.1 33.1 28.2 14.8 Wrist Base 1st Digit     Ulnar Anti Sensory (5th Digit)  30.3C  Wrist *3.8 *3.8 0.0 20.2 18.9 6.4 Wrist 5th Digit *37 *37 0   Motor Left/Right Comparison   Stim Site L Lat (ms) R Lat (ms) L-R Lat (ms) L Amp (mV) R Amp (mV) L-R Amp (%) Site1 Site2 L Vel (m/s) R Vel (m/s) L-R Vel (m/s)  Median Motor (Abd Poll Brev)  30.3C  Wrist 3.8 3.9 0.1 8.2 7.2 12.2 Elbow Wrist 51 50 1  Elbow 8.6 8.8 0.2 5.1 6.7 23.9       Ulnar Motor (Abd Dig Min)  30.3C  Wrist 3.4 3.2 0.2 6.8 9.9 *31.3 B Elbow Wrist *51 58 7  B Elbow 8.0 7.7 0.3 5.2 9.3 44.1 A Elbow B Elbow 67 53 14  A Elbow 9.5 9.6 0.1 5.0 7.2 30.6          Waveforms:

## 2020-03-02 ENCOUNTER — Ambulatory Visit (INDEPENDENT_AMBULATORY_CARE_PROVIDER_SITE_OTHER): Payer: Medicare Other | Admitting: Specialist

## 2020-03-02 ENCOUNTER — Encounter: Payer: Self-pay | Admitting: Specialist

## 2020-03-02 ENCOUNTER — Other Ambulatory Visit: Payer: Self-pay

## 2020-03-02 VITALS — BP 120/81 | HR 65 | Ht 73.5 in | Wt 250.0 lb

## 2020-03-02 DIAGNOSIS — M542 Cervicalgia: Secondary | ICD-10-CM

## 2020-03-02 DIAGNOSIS — G5603 Carpal tunnel syndrome, bilateral upper limbs: Secondary | ICD-10-CM

## 2020-03-02 DIAGNOSIS — M720 Palmar fascial fibromatosis [Dupuytren]: Secondary | ICD-10-CM | POA: Diagnosis not present

## 2020-03-02 DIAGNOSIS — M4819 Ankylosing hyperostosis [Forestier], multiple sites in spine: Secondary | ICD-10-CM

## 2020-03-02 DIAGNOSIS — M4316 Spondylolisthesis, lumbar region: Secondary | ICD-10-CM

## 2020-03-02 MED ORDER — GABAPENTIN 300 MG PO CAPS: ORAL_CAPSULE | ORAL | 3 refills | Status: DC

## 2020-03-02 MED ORDER — DUEXIS 800-26.6 MG PO TABS: 1.0000 | ORAL_TABLET | Freq: Two times a day (BID) | ORAL | 6 refills | Status: DC

## 2020-03-02 NOTE — Patient Instructions (Addendum)
Plan: Avoid overhead lifting and overhead use of the arms. Pillows to keep from sleeping directly on the shoulders Limited lifting to less than 10 lbs. Ice or heat for relief.  Do not use NSAIDs are helpful, such as alleve or motrin,  as they place burdens on the kidney and likely irritate the hiatal hernia and stomach. Stay on the omeprazole. Stretching exercise help and strengthening is helpful to build endurance. Avoid overhead lifting and overhead use of the arms. Do not lift greater than 5 lbs. Adjust head rest in vehicle to prevent hyperextension if rear ended. Take extra precautions to avoid falling. Gabapentin change to 300 mg po bid for 4 days then TID.  Duexis for arthritis pain a combination medicine motrin and antiacid that you may tolerate in the face of GERD. Hemp CBD capsules, amazon.com 5,000-7,000 mg per bottle, 60 capsules per bottle, take one capsule twice a day. Carpal Tunnel Syndrome  Carpal tunnel syndrome is a condition that causes pain in your hand and arm. The carpal tunnel is a narrow area located on the palm side of your wrist. Repeated wrist motion or certain diseases may cause swelling within the tunnel. This swelling pinches the main nerve in the wrist (median nerve). What are the causes? This condition may be caused by:  Repeated wrist motions.  Wrist injuries.  Arthritis.  A cyst or tumor in the carpal tunnel.  Fluid buildup during pregnancy. Sometimes the cause of this condition is not known. What increases the risk? This condition is more likely to develop in:  People who have jobs that cause them to repeatedly move their wrists in the same motion, such as Art gallery manager.  Women.  People with certain conditions, such as: ? Diabetes. ? Obesity. ? An underactive thyroid (hypothyroidism). ? Kidney failure. What are the signs or symptoms? Symptoms of this condition include:  A tingling feeling in your fingers, especially in your  thumb, index, and middle fingers.  Tingling or numbness in your hand.  An aching feeling in your entire arm, especially when your wrist and elbow are bent for long periods of time.  Wrist pain that goes up your arm to your shoulder.  Pain that goes down into your palm or fingers.  A weak feeling in your hands. You may have trouble grabbing and holding items. Your symptoms may feel worse during the night. How is this diagnosed? This condition is diagnosed with a medical history and physical exam. You may also have tests, including:  An electromyogram (EMG). This test measures electrical signals sent by your nerves into the muscles.  X-rays. How is this treated? Treatment for this condition includes:  Lifestyle changes. It is important to stop doing or modify the activity that caused your condition.  Physical or occupational therapy.  Medicines for pain and inflammation. This may include medicine that is injected into your wrist.  A wrist splint.  Surgery. Follow these instructions at home: If you have a splint:   Wear it as told by your health care provider. Remove it only as told by your health care provider.  Loosen the splint if your fingers become numb and tingle, or if they turn cold and blue.  Keep the splint clean and dry. General instructions   Take over-the-counter and prescription medicines only as told by your health care provider.  Rest your wrist from any activity that may be causing your pain. If your condition is work related, talk to your employer about changes that can  be made, such as getting a wrist pad to use while typing.  If directed, apply ice to the painful area: ? Put ice in a plastic bag. ? Place a towel between your skin and the bag. ? Leave the ice on for 20 minutes, 2-3 times per day.  Keep all follow-up visits as told by your health care provider. This is important.  Do any exercises as told by your health care provider, physical  therapist, or occupational therapist. Contact a health care provider if:  You have new symptoms.  Your pain is not controlled with medicines.  Your symptoms get worse. This information is not intended to replace advice given to you by your health care provider. Make sure you discuss any questions you have with your health care provider. Document Released: 09/28/2000 Document Revised: 02/09/2016 Document Reviewed: 06/12/2017 Elsevier Interactive Patient Education  2017 [employer].

## 2020-03-02 NOTE — Progress Notes (Signed)
Office Visit Note   Patient: Allen Robertson           Date of Birth: 1951/04/12           MRN: [id number] Visit Date: 03/02/2020              Requested by: Ivan Anchors, MD [place],  [place] [address] PCP: Ivan Anchors, MD   Assessment & Plan: Visit Diagnoses:  1. Carpal tunnel syndrome, bilateral   2. Dupuytren's disease of palm of both hands   3. Forestier's disease of multiple sites   4. Cervicalgia   5. Spondylolisthesis, lumbar region     Plan: Plan: Avoid overhead lifting and overhead use of the arms. Pillows to keep from sleeping directly on the shoulders Limited lifting to less than 10 lbs. Ice or heat for relief.  Do not use NSAIDs are helpful, such as alleve or motrin,  as they place burdens on the kidney and likely irritate the hiatal hernia and stomach. Stay on the omeprazole. Stretching exercise help and strengthening is helpful to build endurance. Avoid overhead lifting and overhead use of the arms. Do not lift greater than 5 lbs. Adjust head rest in vehicle to prevent hyperextension if rear ended. Take extra precautions to avoid falling. Gabapentin change to 300 mg po bid for 4 days then TID.  Duexis for arthritis pain a combination medicine motrin and antiacid that you may tolerate in the face of GERD. Hemp CBD capsules, amazon.com 5,000-7,000 mg per bottle, 60 capsules per bottle, take one capsule twice a day. Carpal Tunnel Syndrome  Carpal tunnel syndrome is a condition that causes pain in your hand and arm. The carpal tunnel is a narrow area located on the palm side of your wrist. Repeated wrist motion or certain diseases may cause swelling within the tunnel. This swelling pinches the main nerve in the wrist (median nerve). What are the causes? This condition may be caused by:  Repeated wrist motions.  Wrist injuries.  Arthritis.  A cyst or tumor in the carpal tunnel.  Fluid buildup during pregnancy. Sometimes the cause of  this condition is not known. What increases the risk? This condition is more likely to develop in:  People who have jobs that cause them to repeatedly move their wrists in the same motion, such as Art gallery manager.  Women.  People with certain conditions, such as: ? Diabetes. ? Obesity. ? An underactive thyroid (hypothyroidism). ? Kidney failure. What are the signs or symptoms? Symptoms of this condition include:  A tingling feeling in your fingers, especially in your thumb, index, and middle fingers.  Tingling or numbness in your hand.  An aching feeling in your entire arm, especially when your wrist and elbow are bent for long periods of time.  Wrist pain that goes up your arm to your shoulder.  Pain that goes down into your palm or fingers.  A weak feeling in your hands. You may have trouble grabbing and holding items. Your symptoms may feel worse during the night. How is this diagnosed? This condition is diagnosed with a medical history and physical exam. You may also have tests, including:  An electromyogram (EMG). This test measures electrical signals sent by your nerves into the muscles.  X-rays. How is this treated? Treatment for this condition includes:  Lifestyle changes. It is important to stop doing or modify the activity that caused your condition.  Physical or occupational therapy.  Medicines for pain and  inflammation. This may include medicine that is injected into your wrist.  A wrist splint.  Surgery. Follow these instructions at home: If you have a splint:   Wear it as told by your health care provider. Remove it only as told by your health care provider.  Loosen the splint if your fingers become numb and tingle, or if they turn cold and blue.  Keep the splint clean and dry. General instructions   Take over-the-counter and prescription medicines only as told by your health care provider.  Rest your wrist from any activity that may be  causing your pain. If your condition is work related, talk to your employer about changes that can be made, such as getting a wrist pad to use while typing.  If directed, apply ice to the painful area: ? Put ice in a plastic bag. ? Place a towel between your skin and the bag. ? Leave the ice on for 20 minutes, 2-3 times per day.  Keep all follow-up visits as told by your health care provider. This is important.  Do any exercises as told by your health care provider, physical therapist, or occupational therapist. Contact a health care provider if:  You have new symptoms.  Your pain is not controlled with medicines.  Your symptoms get worse. This information is not intended to replace advice given to you by your health care provider. Make sure you discuss any questions you have with your health care provider. Document Released: 09/28/2000 Document Revised: 02/09/2016 Document Reviewed: 06/12/2017 Elsevier Interactive Patient Education  2017 [place] Instructions: Return in about 8 weeks (around 04/27/2020).   Orders:  Orders Placed This Encounter  Procedures  . Ambulatory referral to Hand Surgery   Meds ordered this encounter  Medications  . Ibuprofen-Famotidine (DUEXIS) 800-26.6 MG TABS    Sig: Take 1 tablet by mouth 2 (two) times daily before a meal.    Dispense:  60 tablet    Refill:  6  . gabapentin (NEURONTIN) 300 MG capsule    Sig: Take 1 capsule (300 mg total) by mouth 2 (two) times daily for 4 days, THEN 1 capsule (300 mg total) 3 (three) times daily for 26 days.    Dispense:  90 capsule    Refill:  3      Procedures: No procedures performed   Clinical Data: No additional findings.   Subjective: Chief Complaint  Patient presents with  . Lower Back - Follow-up    69 year old male right handed with history of bilateral arm pain and numbness into the hands. He has some deformities of the palmar skin on the ulnar side of the hand that is  concerning. He took steroid and gabapentin but he does not get much affect. He is on 300 mg of gabapentin at night and took the medrol.    Review of Systems  Constitutional: Negative.  Negative for activity change, appetite change, chills, diaphoresis, fatigue, fever and unexpected weight change.  HENT: Positive for congestion, rhinorrhea, sinus pressure, sneezing and sore throat. Negative for dental problem, drooling, ear discharge, ear pain, facial swelling, hearing loss, mouth sores, nosebleeds, postnasal drip, sinus pain, tinnitus, trouble swallowing and voice change.   Eyes: Negative.  Negative for photophobia, pain, discharge, redness, itching and visual disturbance.  Respiratory: Negative.  Negative for apnea, cough, choking, chest tightness, shortness of breath, wheezing and stridor.   Cardiovascular: Positive for chest pain. Negative for palpitations and leg swelling.  Gastrointestinal: Positive for abdominal distention, abdominal  pain and diarrhea. Negative for anal bleeding, blood in stool, constipation, nausea, rectal pain and vomiting.  Endocrine: Negative.  Negative for cold intolerance, heat intolerance, polydipsia, polyphagia and polyuria.  Genitourinary: Negative.  Negative for difficulty urinating, dysuria, enuresis, flank pain, frequency, hematuria and urgency.  Musculoskeletal: Negative.   Skin: Negative.   Allergic/Immunologic: Negative.  Negative for environmental allergies, food allergies and immunocompromised state.  Neurological: Positive for numbness. Negative for dizziness, tremors, seizures, syncope, facial asymmetry, speech difficulty, weakness, light-headedness and headaches.  Hematological: Negative.  Negative for adenopathy. Does not bruise/bleed easily.  Psychiatric/Behavioral: Negative.  Negative for agitation, behavioral problems, confusion, decreased concentration, dysphoric mood, hallucinations, self-injury, sleep disturbance and suicidal ideas. The patient is  not nervous/anxious and is not hyperactive.      Objective: Vital Signs: BP 120/81 (BP Location: Left Arm, Patient Position: Sitting)   Pulse 65   Ht 6' 1.5" (1.867 m)   Wt 250 lb (113.4 kg)   BMI 32.54 kg/m   Physical Exam Constitutional:      Appearance: He is well-developed.  HENT:     Head: Normocephalic and atraumatic.  Eyes:     Pupils: Pupils are equal, round, and reactive to light.  Pulmonary:     Effort: Pulmonary effort is normal.     Breath sounds: Normal breath sounds.  Abdominal:     General: Bowel sounds are normal.     Palpations: Abdomen is soft.  Musculoskeletal:        General: Normal range of motion.     Cervical back: Normal range of motion and neck supple.  Skin:    General: Skin is warm and dry.  Neurological:     Mental Status: He is alert and oriented to person, place, and time.  Psychiatric:        Behavior: Behavior normal.        Thought Content: Thought content normal.        Judgment: Judgment normal.     Ortho Exam  Specialty Comments:  No specialty comments available.  Imaging: No results found.   PMFS History: Patient Active Problem List   Diagnosis Date Noted  . Educated about COVID-19 virus infection 03/13/2019  . Lumbar degenerative disc disease 11/26/2017  . Greater trochanteric bursitis, left 10/29/2017  . Chest pain of uncertain etiology [id number]  . Hyperlipidemia 05/28/2017  . GERD (gastroesophageal reflux disease) 05/28/2017  . Abnormal EKG 05/28/2017  . Adiposity 09/13/2015  . 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 History of knee surgery 09/12/2015  . History of artificial joint 09/12/2015  . Foreign body (FB) in soft tissue 07/08/2014  . Lesion of soft tissue 07/08/2014  . Joint pain 04/22/2014  . Soft tissue disorder 04/22/2014  . Groin injury 03/31/2013  . Pubic bone pain 03/31/2013  . History of arthroplasty 04/11/2012  . Squamous cell carcinoma of base of tongue ([facility]) 11/28/2011  . Cancer of  tongue ([facility]) 11/23/2011  . Tongue mass 11/07/2011  . Snoring 10/19/2011  . Varicose veins 10/19/2011   Past Medical History:  Diagnosis Date  . Cancer ([facility] [facility]) 2013   tongue  . Fracture of left clavicle    2000 / also had infection had to have IV infusion therapy   . GERD (gastroesophageal reflux disease)   . Headache    prescribed metoprolol for HA management  . History of chicken pox   . History of kidney stones   . Hyperlipidemia   . Recurrent falls   . Staph skin  infection   . Tinnitus   . Varicose veins     Family History  Problem Relation Age of Onset  . Hypertension Mother   . Dementia Mother   . Leukemia Father   . Headache Father   . Other Father        liver aneurysm  . Colon cancer Maternal Grandmother 54  . Esophageal cancer Neg Hx   . Pancreatic cancer Neg Hx   . Stomach cancer Neg Hx   . Liver disease Neg Hx     Past Surgical History:  Procedure Laterality Date  . ablation calf     2009  . BRISTOW PROCEDURE     left shoulder   . CHOLECYSTECTOMY N/A 06/09/2019   Procedure: LAPAROSCOPIC CHOLECYSTECTOMY;  Surgeon: Erroll Luna, MD;  Location: [facility];  Service: General;  Laterality: N/A;  . I & D EXTREMITY     right lower leg   . JOINT REPLACEMENT    . LEFT HEART CATH AND CORONARY ANGIOGRAPHY N/A 02/27/2019   Procedure: LEFT HEART CATH AND CORONARY ANGIOGRAPHY;  Surgeon: Troy Sine, MD;  Location: [facility];  Service: Cardiovascular;  Laterality: N/A;  . LEG SURGERY Right   . LITHOTRIPSY    . NASAL TURBINATE REDUCTION Bilateral    with deviated septum repair  . PICC LINE PLACE PERIPHERAL ([facility] HX)     history of / secondary to right lower leg infection   . SHOULDER ARTHROSCOPY Left   . TONGUE SURGERY     robotic assisted with bilateral neck dissection 11/2011  . TOTAL KNEE ARTHROPLASTY Right    partial  . TOTAL KNEE REVISION Right 09/12/2015   Procedure: RIGHT TOTAL KNEE REVISION;  Surgeon: Paralee Cancel, MD;  Location: [facility];  Service:  Orthopedics;  Laterality: Right;   Social History   Occupational History  . Occupation: Retired  Tobacco Use  . Smoking status: Former Smoker    Packs/day: 1.00    Years: 9.00    Pack years: 9.00    Types: Cigarettes    Quit date: 10/15/1980    Years since quitting: 39.4  . Smokeless tobacco: Former Systems developer    Types: [place] date: 10/16/2007  Substance and Sexual Activity  . Alcohol use: Yes    Alcohol/week: 12.0 standard drinks    Types: 12 Cans of beer per week  . Drug use: No  . Sexual activity: Not on file

## 2020-03-03 LAB — B12 AND FOLATE PANEL
Folate: 17.8 ng/mL
Vitamin B-12: 375 pg/mL (ref 200–1100)

## 2020-04-29 ENCOUNTER — Ambulatory Visit (INDEPENDENT_AMBULATORY_CARE_PROVIDER_SITE_OTHER): Payer: Medicare Other | Admitting: Specialist

## 2020-04-29 ENCOUNTER — Other Ambulatory Visit: Payer: Self-pay

## 2020-04-29 ENCOUNTER — Encounter: Payer: Self-pay | Admitting: Specialist

## 2020-04-29 VITALS — BP 112/77 | HR 63 | Ht 74.0 in | Wt 245.0 lb

## 2020-04-29 DIAGNOSIS — M4819 Ankylosing hyperostosis [Forestier], multiple sites in spine: Secondary | ICD-10-CM

## 2020-04-29 DIAGNOSIS — G5603 Carpal tunnel syndrome, bilateral upper limbs: Secondary | ICD-10-CM | POA: Diagnosis not present

## 2020-04-29 DIAGNOSIS — M4316 Spondylolisthesis, lumbar region: Secondary | ICD-10-CM

## 2020-04-29 DIAGNOSIS — M5136 Other intervertebral disc degeneration, lumbar region: Secondary | ICD-10-CM | POA: Diagnosis not present

## 2020-04-29 DIAGNOSIS — M51369 Other intervertebral disc degeneration, lumbar region without mention of lumbar back pain or lower extremity pain: Secondary | ICD-10-CM

## 2020-04-29 DIAGNOSIS — M4306 Spondylolysis, lumbar region: Secondary | ICD-10-CM | POA: Diagnosis not present

## 2020-04-29 NOTE — Progress Notes (Signed)
Office Visit Note   Patient: Allen Robertson           Date of Birth: 08-28-1951           MRN: [id number] Visit Date: 04/29/2020              Requested by: Allen Anchors, MD Allen Robertson,  Allen Robertson [id number] PCP: Allen Anchors, MD   Assessment & Plan: Visit Diagnoses:  1. Lumbar degenerative disc disease   2. Degenerative lumbar disc   3. Spondylolisthesis, lumbar region   4. Spondylolysis, lumbar region   5. Carpal tunnel syndrome, bilateral   6. Forestier's disease of multiple sites     Plan:Avoid frequent bending and stooping  No lifting greater than 10 lbs. May use ice or moist heat for pain. Weight loss is of benefit. Best medication for lumbar disc disease is arthritis medications like motrin, celebrex and naprosyn. Exercise is important to improve your indurance and does allow people to function better inspite of back pain. Stretching exercises prior to playing golf and low impact aerobics.  Hemp CBD capsules, amazon.com 5,000-7,000 mg per bottle, 60 capsules per bottle, take one capsule twice a day. May want to discuss with your primary care MD Allen Robertson whether he could use duloxetine or cymbalta for the Same condition instead of seratraline as cymbalta is approved for use with musculoskeletal pain. Follow-Up Instructions: No follow-ups on file.   Follow-Up Instructions: Return in about 4 weeks (around 05/27/2020).   Orders:  No orders of the defined types were placed in this encounter.  No orders of the defined types were placed in this encounter.     Procedures: No procedures performed   Clinical Data: No additional findings.   Subjective: Chief Complaint  Patient presents with  . Lower Back - Follow-up    69 year old male with low back pain with hand pain. Had EMG/NCV and this showed mild CTS. Saw Dr. Fredna Robertson and he felt that he has arthrosis in the hands but it is not bad enough to warrant Surgery. This will be followed. He does  not want to continue with gabapentin. Recently started on seratraline for anxiety and chest pains he has been experiencing. He has been on meds for GERD And avoids the ibuprofen unless he is really suffering, in stead tylenol ES or arthritis. History of  Gout and foot pain at the base of the left great toe.His pain is better and he is returning to a walking program   Review of Systems  Constitutional: Negative.   HENT: Negative.   Eyes: Negative.   Respiratory: Negative.   Cardiovascular: Negative.   Gastrointestinal: Negative.   Endocrine: Negative.   Genitourinary: Negative.   Musculoskeletal: Negative.   Skin: Negative.   Allergic/Immunologic: Negative.   Neurological: Negative.   Hematological: Negative.   Psychiatric/Behavioral: Negative.      Objective: Vital Signs: BP 112/77   Pulse 63   Wt 245 lb (111.1 kg)   BMI 31.89 kg/m   Physical Exam Constitutional:      Appearance: He is well-developed.  HENT:     Head: Normocephalic and atraumatic.  Eyes:     Pupils: Pupils are equal, round, and reactive to light.  Pulmonary:     Effort: Pulmonary effort is normal.     Breath sounds: Normal breath sounds.  Abdominal:     General: Bowel sounds are normal.     Palpations: Abdomen is soft.  Musculoskeletal:  Cervical back: Normal range of motion and neck supple.     Lumbar back: Negative right straight leg raise test and negative left straight leg raise test.  Skin:    General: Skin is warm and dry.  Neurological:     Mental Status: He is alert and oriented to person, place, and time.  Psychiatric:        Behavior: Behavior normal.        Thought Content: Thought content normal.        Judgment: Judgment normal.     Back Exam   Tenderness  The patient is experiencing tenderness in the lumbar.  Range of Motion  Extension: abnormal  Flexion: abnormal  Lateral bend right: normal  Lateral bend left: normal  Rotation right: normal  Rotation left: normal     Muscle Strength  Right Quadriceps:  5/5  Left Quadriceps:  5/5  Right Hamstrings:  5/5  Left Hamstrings:  5/5   Tests  Straight leg raise right: negative Straight leg raise left: negative  Reflexes  Patellar:  2/4 normal Achilles: 2/4 Biceps:  2/4 normal Babinski's sign: normal   Other  Toe walk: normal Heel walk: normal Sensation: normal Gait: normal  Erythema: no back redness Scars: absent      Specialty Comments:  No specialty comments available.  Imaging: No results found.   PMFS History: Patient Active Problem List   Diagnosis Date Noted  . Educated about COVID-19 virus infection 03/13/2019  . Lumbar degenerative disc disease 11/26/2017  . Greater trochanteric bursitis, left 10/29/2017  . Chest pain of uncertain etiology 67/67/2094  . Hyperlipidemia 05/28/2017  . GERD (gastroesophageal reflux disease) 05/28/2017  . Abnormal EKG 05/28/2017  . Adiposity 09/13/2015  . 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 History of knee surgery 09/12/2015  . History of artificial joint 09/12/2015  . Foreign body (FB) in soft tissue 07/08/2014  . Lesion of soft tissue 07/08/2014  . Joint pain 04/22/2014  . Soft tissue disorder 04/22/2014  . Groin injury 03/31/2013  . Pubic bone pain 03/31/2013  . History of arthroplasty 04/11/2012  . Squamous cell carcinoma of base of tongue ([facility]) 11/28/2011  . Cancer of tongue ([facility]) 11/23/2011  . Tongue mass 11/07/2011  . Snoring 10/19/2011  . Varicose veins 10/19/2011   Past Medical History:  Diagnosis Date  . Cancer ([facility]) 2013   tongue  . Fracture of left clavicle    2000 / also had infection had to have IV infusion therapy   . GERD (gastroesophageal reflux disease)   . Headache    prescribed metoprolol for HA management  . History of chicken pox   . History of kidney stones   . Hyperlipidemia   . Recurrent falls   . Staph skin infection   . Tinnitus   . Varicose veins     Family History  Problem  Relation Age of Onset  . Hypertension Mother   . Dementia Mother   . Leukemia Father   . Headache Father   . Other Father        liver aneurysm  . Colon cancer Maternal Grandmother 31  . Esophageal cancer Neg Hx   . Pancreatic cancer Neg Hx   . Stomach cancer Neg Hx   . Liver disease Neg Hx     Past Surgical History:  Procedure Laterality Date  . ablation calf     2009  . BRISTOW PROCEDURE     left shoulder   . CHOLECYSTECTOMY N/A  06/09/2019   Procedure: LAPAROSCOPIC CHOLECYSTECTOMY;  Surgeon: Erroll Luna, MD;  Location: [facility];  Service: General;  Laterality: N/A;  . I & D EXTREMITY     right lower leg   . JOINT REPLACEMENT    . LEFT HEART CATH AND CORONARY ANGIOGRAPHY N/A 02/27/2019   Procedure: LEFT HEART CATH AND CORONARY ANGIOGRAPHY;  Surgeon: Troy Sine, MD;  Location: [facility];  Service: Cardiovascular;  Laterality: N/A;  . LEG SURGERY Right   . LITHOTRIPSY    . NASAL TURBINATE REDUCTION Bilateral    with deviated septum repair  . PICC LINE PLACE PERIPHERAL ([facility] HX)     history of / secondary to right lower leg infection   . SHOULDER ARTHROSCOPY Left   . TONGUE SURGERY     robotic assisted with bilateral neck dissection 11/2011  . TOTAL KNEE ARTHROPLASTY Right    partial  . TOTAL KNEE REVISION Right 09/12/2015   Procedure: RIGHT TOTAL KNEE REVISION;  Surgeon: Paralee Cancel, MD;  [facility];  Service: Orthopedics;  Laterality: Right;   Social History   Occupational History  . Occupation: Retired  Tobacco Use  . Smoking status: Former Smoker    Packs/day: 1.00    Years: 9.00    Pack years: 9.00    Types: Cigarettes    Quit date: 10/15/1980    Years since quitting: 39.5  . Smokeless tobacco: Former Systems developer    Types: [place] date: 10/16/2007  Vaping Use  . Vaping Use: Never used  Substance and Sexual Activity  . Alcohol use: Yes    Alcohol/week: 12.0 standard drinks    Types: 12 Cans of beer per week  . Drug use: No  . Sexual  activity: Not on file

## 2020-04-29 NOTE — Patient Instructions (Addendum)
Avoid frequent bending and stooping  No lifting greater than 10 lbs. May use ice or moist heat for pain. Weight loss is of benefit. Best medication for lumbar disc disease is arthritis medications like motrin, celebrex and naprosyn. Exercise is important to improve your indurance and does allow people to function better inspite of back pain. May want to discuss with your primary care MD Dr. Claiborne Billings whether he could use duloxetine or cymbalta for the Same condition instead of seratraline as cymbalta is approved for use with musculoskeletal pain. Stretching exercises prior to playing golf and low impact aerobics. Hemp CBD capsules, amazon.com 5,000-7,000 mg per bottle, 60 capsules per bottle, take one capsule twice a day.  Follow-Up Instructions: No follow-ups on file.

## 2021-01-24 DIAGNOSIS — I10 Essential (primary) hypertension: Secondary | ICD-10-CM | POA: Insufficient documentation

## 2021-04-21 ENCOUNTER — Ambulatory Visit (INDEPENDENT_AMBULATORY_CARE_PROVIDER_SITE_OTHER): Payer: Medicare Other

## 2021-04-21 ENCOUNTER — Ambulatory Visit (INDEPENDENT_AMBULATORY_CARE_PROVIDER_SITE_OTHER): Payer: Medicare Other | Admitting: Sports Medicine

## 2021-04-21 ENCOUNTER — Other Ambulatory Visit: Payer: Self-pay

## 2021-04-21 ENCOUNTER — Encounter: Payer: Self-pay | Admitting: Sports Medicine

## 2021-04-21 DIAGNOSIS — M1612 Unilateral primary osteoarthritis, left hip: Secondary | ICD-10-CM

## 2021-04-21 MED ORDER — CELECOXIB 100 MG PO CAPS: ORAL_CAPSULE | ORAL | 6 refills | Status: DC

## 2021-04-21 NOTE — Progress Notes (Signed)
    Procedures performed today:    None.  Independent interpretation of notes and tests performed by another provider:   I personally reviewed his prior x-rays, he has L5-S1 DDD with spondylolisthesis, he also has bilateral hip osteoarthritis, right worse than left.  Brief History, Exam, Impression, and Recommendations:    Primary osteoarthritis of left hip Nashawn returns, he is a very pleasant 70 year old male, historically we treated him for trochanteric bursitis, he had an injection back in 2019 that provided good relief, more recently he is having pain deep in his buttock somewhat posterior to the greater trochanter. He has known lumbar DDD with L5-S1 spondylolisthesis, showed bilateral hip osteoarthritis as well, on exam today he has reproduction of pain with internal rotation of the hip suggesting hip joint pain generator, he has GERD so we will use Celebrex at a low-dose. Continue pantoprazole twice daily as prescribed by his PCP. Adding updated hip x-rays and some hip osteoarthritis rehabilitation exercises, he does have a trip coming up to [place] on 24 July, I would like to see him a couple of days before that for consideration of a hip joint injection.  This is a chronic process with exacerbation and pharmacologic intervention    ___________________________________________ Gwen Her. Dianah Field, M.D., ABFM., CAQSM. Primary Care and [place] Instructor of [place] [facility]

## 2021-04-21 NOTE — Assessment & Plan Note (Addendum)
Allen Robertson returns, he is a very pleasant 70 year old male, historically we treated him for trochanteric bursitis, he had an injection back in 2019 that provided good relief, more recently he is having pain deep in his buttock somewhat posterior to the greater trochanter. He has known lumbar DDD with L5-S1 spondylolisthesis, showed bilateral hip osteoarthritis as well, on exam today he has reproduction of pain with internal rotation of the hip suggesting hip joint pain generator, he has GERD so we will use Celebrex at a low-dose. Continue pantoprazole twice daily as prescribed by his PCP. Adding updated hip x-rays and some hip osteoarthritis rehabilitation exercises, he does have a trip coming up to [place] on 24 July, I would like to see him a couple of days before that for consideration of a hip joint injection.  This is a chronic process with exacerbation and pharmacologic intervention

## 2021-05-05 ENCOUNTER — Ambulatory Visit (INDEPENDENT_AMBULATORY_CARE_PROVIDER_SITE_OTHER): Payer: Medicare Other

## 2021-05-05 ENCOUNTER — Ambulatory Visit (INDEPENDENT_AMBULATORY_CARE_PROVIDER_SITE_OTHER): Payer: Medicare Other | Admitting: Sports Medicine

## 2021-05-05 ENCOUNTER — Other Ambulatory Visit: Payer: Self-pay

## 2021-05-05 DIAGNOSIS — M1612 Unilateral primary osteoarthritis, left hip: Secondary | ICD-10-CM

## 2021-05-05 NOTE — Assessment & Plan Note (Signed)
Allen Robertson returns, he is a very pleasant 70 year old male with a history of trochanteric bursitis that resolved after an injection back in 2019, more recently he has been having pain deep in the buttock, posterior to the greater trochanter, he had x-rays that showed lumbar DDD, he also had reproduction of pain with internal rotation of the hip suggesting the hip joint is the pain generator, he has used Celebrex at low-dose and pantoprazole twice daily, insufficient improvement, he does have a trip to [place] coming up on Monday, and he is hoping for an injection today as conservative treatment has not provided him with sufficient relief. This is a chronic process with exacerbation and pharmacologic intervention. His hip was injected today, return to see me in a month.

## 2021-05-05 NOTE — Progress Notes (Signed)
    Procedures performed today:    Procedure: Real-time Ultrasound Guided injection of the left hip joint Device: Samsung HS60  Verbal informed consent obtained.  Time-out conducted.  Noted no overlying erythema, induration, or other signs of local infection.  Skin prepped in a sterile fashion.  Local anesthesia: Topical Ethyl chloride.  With sterile technique and under real time ultrasound guidance: Noted arthritic hip, 1 cc Kenalog 40, 2 cc lidocaine, 2 cc bupivacaine injected easily Completed without difficulty  Advised to call if fevers/chills, erythema, induration, drainage, or persistent bleeding.  Images permanently stored and available for review in PACS.  Impression: Technically successful ultrasound guided injection.  Independent interpretation of notes and tests performed by another provider:   None.  Brief History, Exam, Impression, and Recommendations:    Primary osteoarthritis of left hip Allen Robertson returns, he is a very pleasant 70 year old male with a history of trochanteric bursitis that resolved after an injection back in 2019, more recently he has been having pain deep in the buttock, posterior to the greater trochanter, he had x-rays that showed lumbar DDD, he also had reproduction of pain with internal rotation of the hip suggesting the hip joint is the pain generator, he has used Celebrex at low-dose and pantoprazole twice daily, insufficient improvement, he does have a trip to [place] coming up on Monday, and he is hoping for an injection today as conservative treatment has not provided him with sufficient relief. This is a chronic process with exacerbation and pharmacologic intervention. His hip was injected today, return to see me in a month.    ___________________________________________ Gwen Her. Dianah Field, M.D., ABFM., CAQSM. Primary Care and [place] Instructor of [employer] [facility] of Medicine

## 2021-06-09 ENCOUNTER — Ambulatory Visit: Payer: Medicare Other | Admitting: Sports Medicine

## 2021-07-19 IMAGING — DX DG HIP (WITH OR WITHOUT PELVIS) 2-3V*L*
3 series · 3 of 3 positions shown · non-contrast
Comparison: X-ray pelvis 07/11/2016

CLINICAL DATA: Question left hip osteoarthrosis

EXAM:
DG HIP (WITH OR WITHOUT PELVIS) 2-3V LEFT

[pelvis ap]
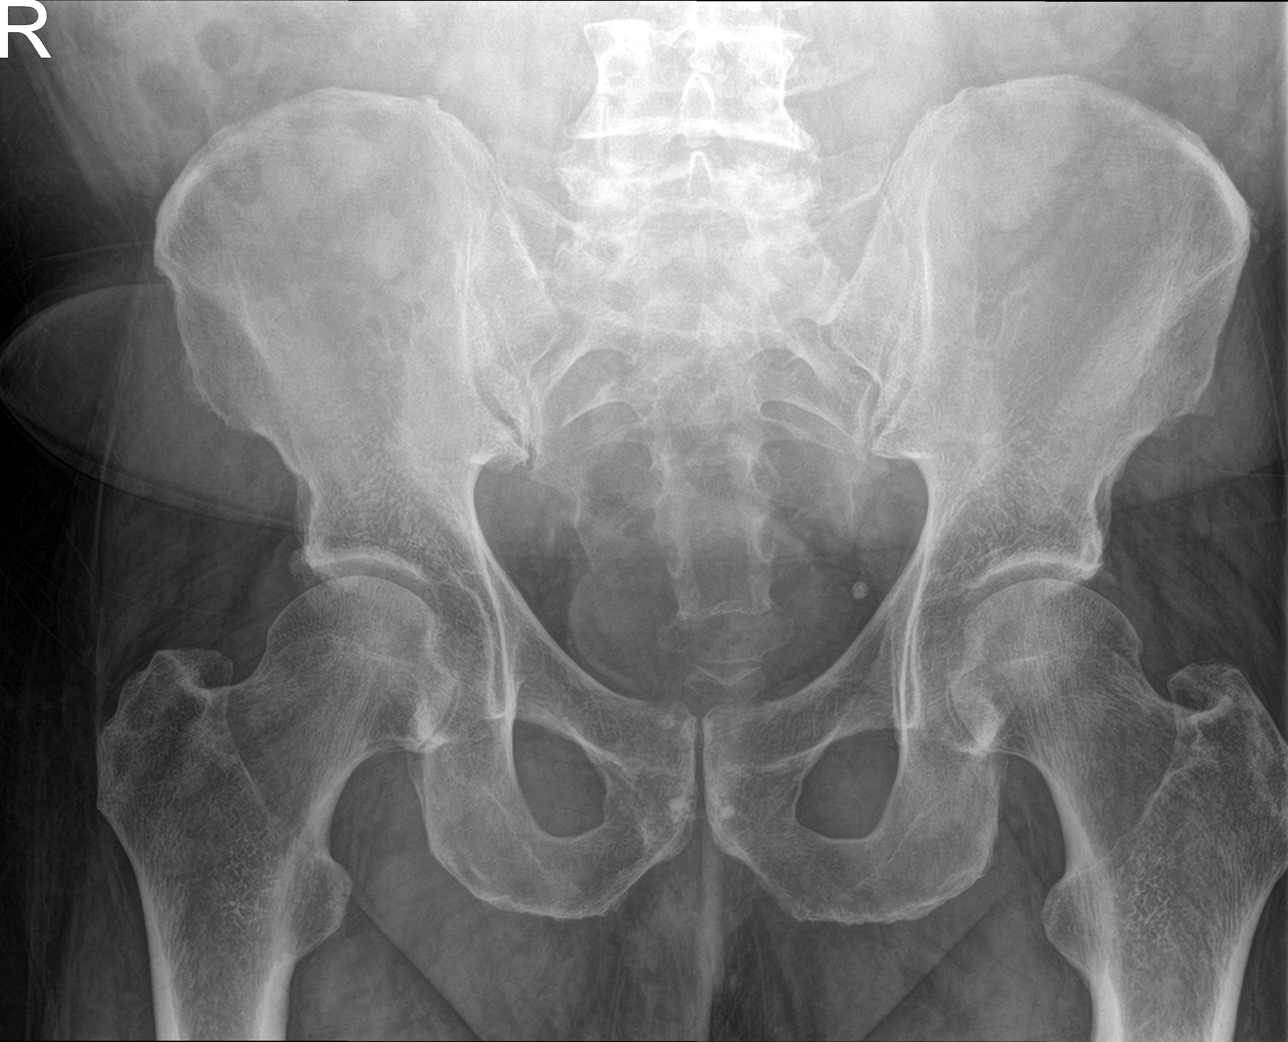

[hip ap]
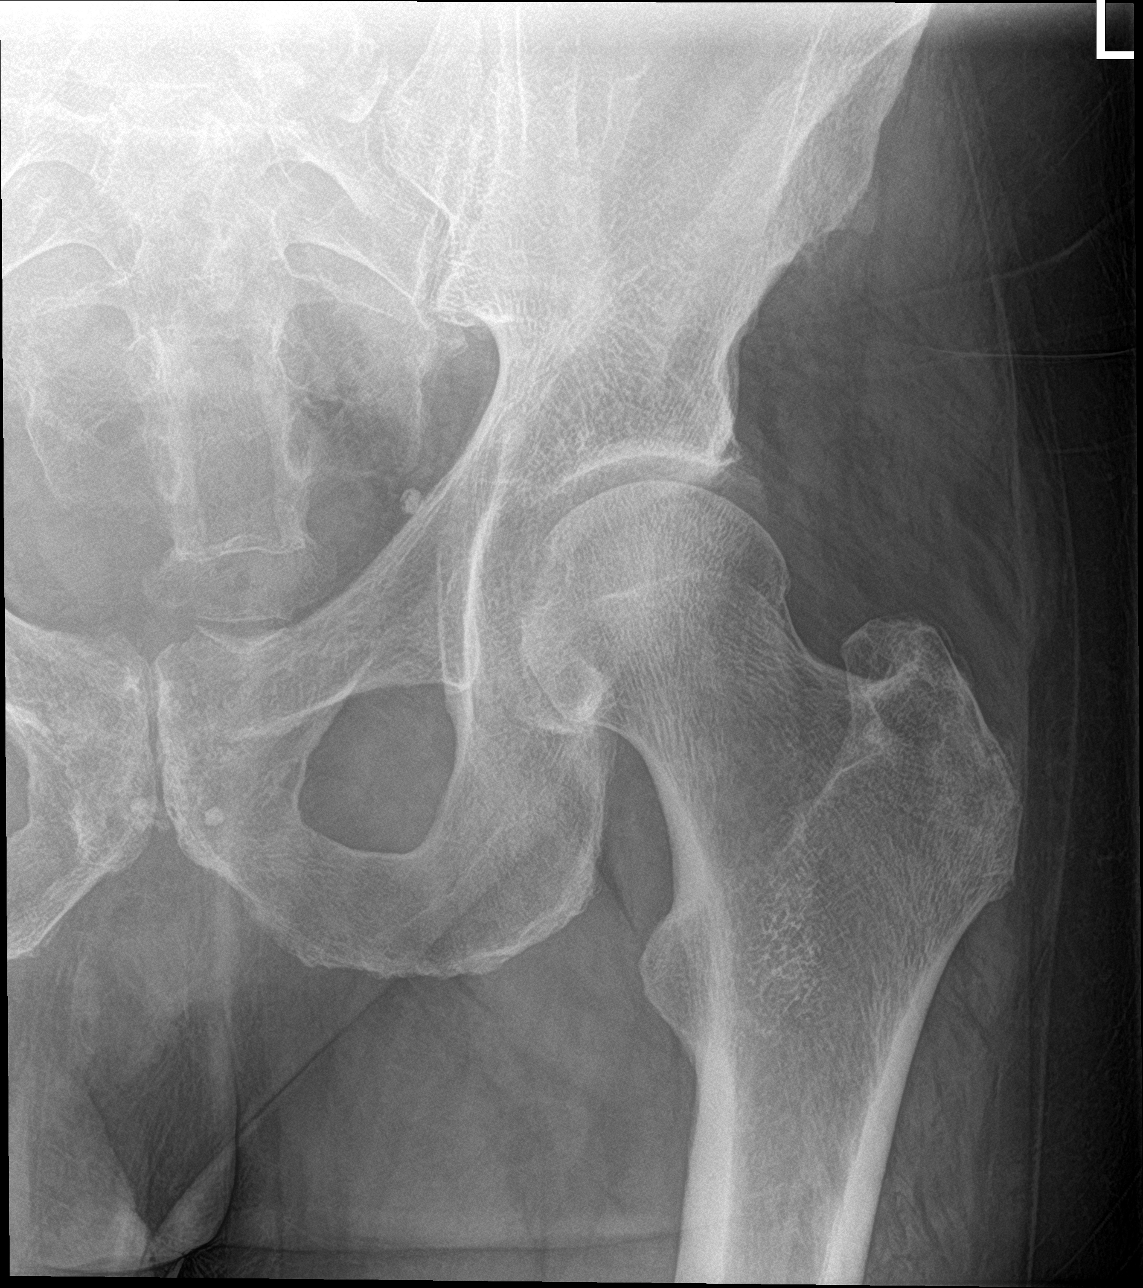

[hip lat]
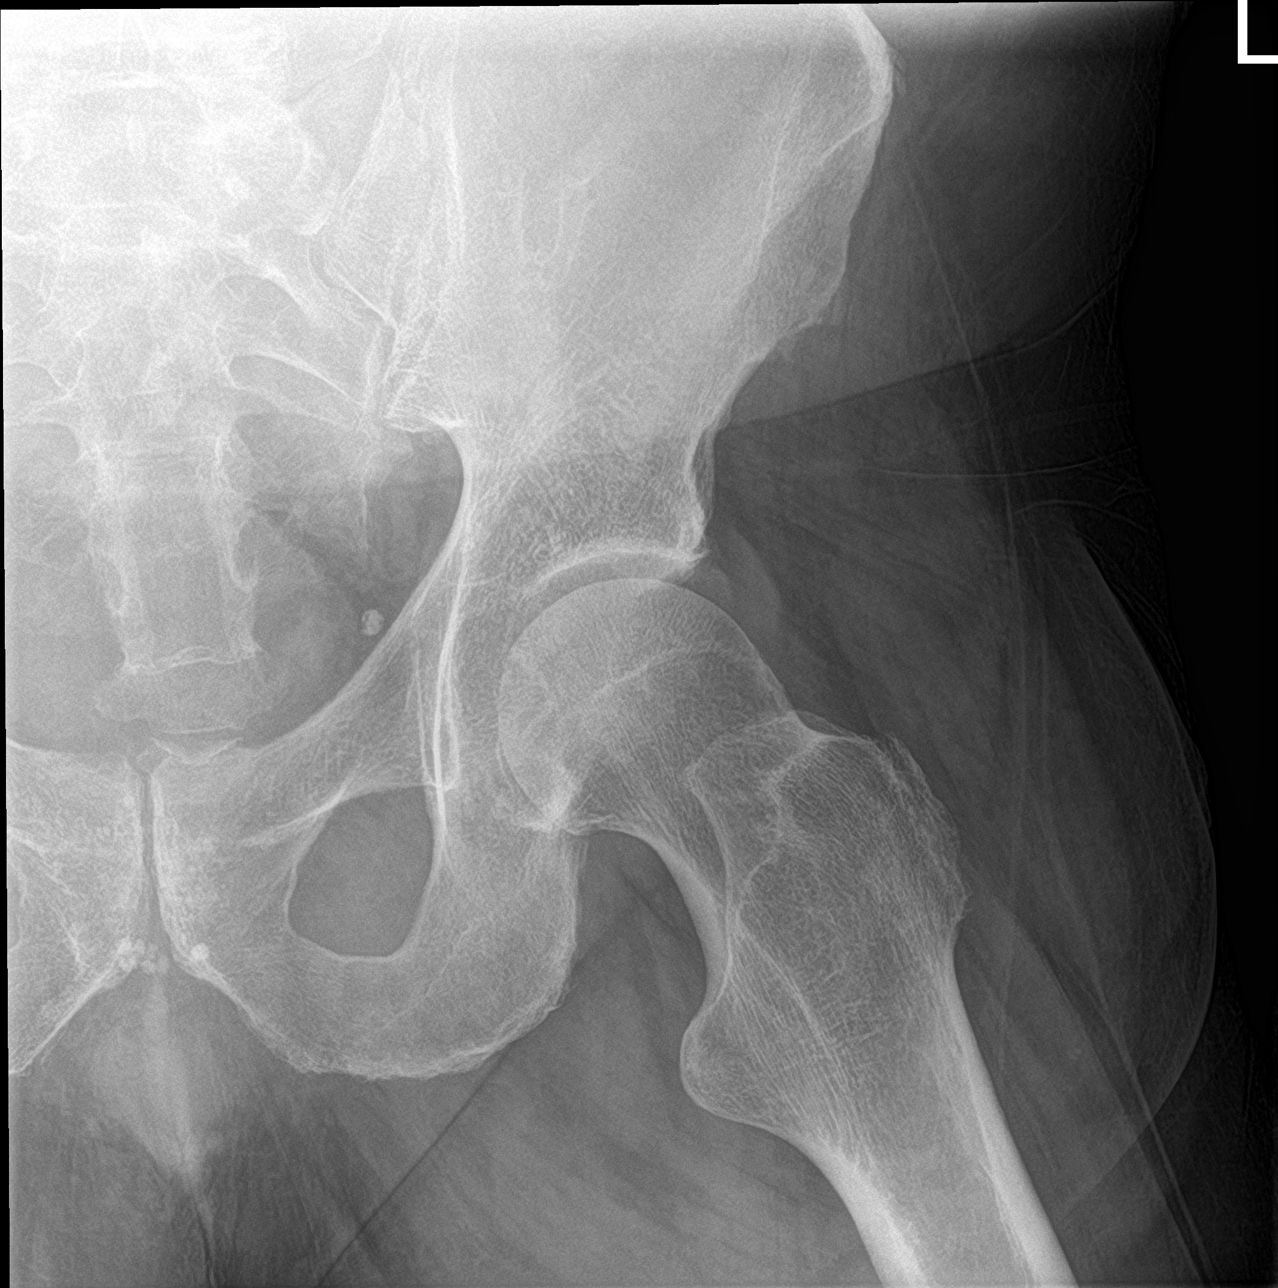

[3 of 3 positions shown; findings below may reference images not displayed]

FINDINGS: There is no evidence of hip fracture or dislocation. There is no
evidence of severe arthropathy or other focal bone abnormality.
Degenerative changes of the lower lumbar spine. Phleboliths within
the pelvis.
IMPRESSION: No acute displaced fracture, dislocation, or arthropathy of the left
hip.

## 2021-10-28 ENCOUNTER — Other Ambulatory Visit: Payer: Self-pay

## 2021-10-28 ENCOUNTER — Emergency Department (INDEPENDENT_AMBULATORY_CARE_PROVIDER_SITE_OTHER)
Admission: EM | Admit: 2021-10-28 | Discharge: 2021-10-28 | Disposition: A | Discharge status: Home / Self Care | Payer: Medicare Other | Source: Home / Self Care

## 2021-10-28 DIAGNOSIS — K439 Ventral hernia without obstruction or gangrene: Secondary | ICD-10-CM | POA: Diagnosis not present

## 2021-10-28 NOTE — ED Triage Notes (Addendum)
Pt presents to Urgent Care with c/o mass on abdomen, noticed last night. Mass from mid to upper abdomen noted. Denies pain and any GI symptoms.

## 2021-10-28 NOTE — Discharge Instructions (Addendum)
Advised/encouraged patient to follow-up with PCP for further evaluation and/or referral to general surgery for abdominal hernia consult.

## 2021-10-28 NOTE — ED Provider Notes (Signed)
[facility] Robertson [facility]    CSN: [id number] Arrival date & time: 10/28/21  1129      History   Chief Complaint Chief Complaint  Patient presents with   Mass    HPI Allen Robertson is a 71 y.o. male.   HPI 71 year old male presents with concern of mass of abdomen noted last night.  Denies pain or any GI symptoms.  PMH significant for tongue carcinoma/BCC and recurrent falls.  Past Medical History:  Diagnosis Date   Cancer ([place]) 2013   tongue   Fracture of left clavicle    2000 / also had infection had to have IV infusion therapy    GERD (gastroesophageal reflux disease)    Headache    prescribed metoprolol for HA management   History of chicken pox    History of kidney stones    Hyperlipidemia    Recurrent falls    Staph skin infection    Tinnitus    Varicose veins     Patient Active Problem List   Diagnosis Date Noted   Educated about COVID-19 virus infection 03/13/2019   Lumbar degenerative disc disease 11/26/2017   Primary osteoarthritis of left hip 10/29/2017   Chest pain of uncertain etiology 67/89/3810   Hyperlipidemia 05/28/2017   GERD (gastroesophageal reflux disease) 05/28/2017   Abnormal EKG 05/28/2017   Adiposity 09/13/2015   S/P conversion right UKR to TKA 09/12/2015   History of knee surgery 09/12/2015   History of artificial joint 09/12/2015   Foreign body (FB) in soft tissue 07/08/2014   Lesion of soft tissue 07/08/2014   Joint pain 04/22/2014   Soft tissue disorder 04/22/2014   Groin injury 03/31/2013   Pubic bone pain 03/31/2013   History of arthroplasty 04/11/2012   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   Cancer of tongue ([place]) 11/23/2011   Tongue mass 11/07/2011   Snoring 10/19/2011   Varicose veins 10/19/2011    Past Surgical History:  Procedure Laterality Date   ablation calf     2009   BRISTOW PROCEDURE     left shoulder    CHOLECYSTECTOMY N/A 06/09/2019   Procedure: LAPAROSCOPIC CHOLECYSTECTOMY;   Surgeon: Erroll Luna, MD;  Location: [place];  Service: General;  Laterality: N/A;   I & D EXTREMITY     right lower leg    JOINT REPLACEMENT     LEFT HEART CATH AND CORONARY ANGIOGRAPHY N/A 02/27/2019   Procedure: LEFT HEART CATH AND CORONARY ANGIOGRAPHY;  Surgeon: Troy Sine, MD;  Location: [facility];  Service: Cardiovascular;  Laterality: N/A;   LEG SURGERY Right    LITHOTRIPSY     NASAL TURBINATE REDUCTION Bilateral    with deviated septum repair   PICC LINE PLACE PERIPHERAL ([place] HX)     history of / secondary to right lower leg infection    SHOULDER ARTHROSCOPY Left    TONGUE SURGERY     robotic assisted with bilateral neck dissection 11/2011   TOTAL KNEE ARTHROPLASTY Right    partial   TOTAL KNEE REVISION Right 09/12/2015   Procedure: RIGHT TOTAL KNEE REVISION;  Surgeon: Paralee Cancel, MD;  Location: [facility];  Service: Orthopedics;  Laterality: Right;       Home Medications    Prior to Admission medications   Medication Sig Start Date End Date Taking? Authorizing Provider  acetaminophen (TYLENOL) 500 MG tablet Take 1,000 mg by mouth every 6 (six) hours as needed for mild pain.    [provider]  atorvastatin (LIPITOR) 10 MG tablet Take 10 mg by mouth daily at 6 PM.     [provider]  celecoxib (CELEBREX) 100 MG capsule 1 to 2 capsules daily. 04/21/21   Silverio Decamp, MD  colchicine 0.6 MG tablet Take 0.6 mg by mouth daily as needed (gout).    [provider]  metoprolol succinate (TOPROL-XL) 25 MG 24 hr tablet Take 25 mg by mouth daily.    [provider]  Multiple Vitamins-Minerals (ICAPS AREDS 2 PO) Take 1 tablet by mouth 2 (two) times daily.    [provider]  pantoprazole (PROTONIX) 40 MG tablet Take 1 tablet (40 mg total) by mouth 2 (two) times daily. 12/23/19   Ladene Artist, MD  sodium chloride (OCEAN) 0.65 % SOLN nasal spray Place 1 spray into both nostrils as needed for congestion.    [provider]  SUMAtriptan (IMITREX) 50 MG tablet Take 50 mg by mouth every 2 (two) hours as needed for migraine. May repeat in 2 hours if headache persists or recurs.    [provider]  zolpidem (AMBIEN) 10 MG tablet Take 10 mg by mouth at bedtime.    [provider]    Family History Family History  Problem Relation Age of Onset   Hypertension Mother    Dementia Mother    Leukemia Father    Headache Father    Other Father        liver aneurysm   Colon cancer Maternal Grandmother 74   Esophageal cancer Neg Hx    Pancreatic cancer Neg Hx    Stomach cancer Neg Hx    Liver disease Neg Hx     Social History Social History   Tobacco Use   Smoking status: Former    Packs/day: 1.00    Years: 9.00    Pack years: 9.00    Types: Cigarettes    Quit date: 10/15/1980    Years since quitting: 41.0   Smokeless tobacco: Former    Types: Chew    Quit date: 10/16/2007  Vaping Use   Vaping Use: Never used  Substance Use Topics   Alcohol use: Not Currently    Alcohol/week: 42.0 standard drinks    Types: 42 Standard drinks or equivalent per week    Comment: per week   Drug use: No     Allergies   Percocet [oxycodone-acetaminophen]   Review of Systems Review of Systems  Gastrointestinal:        Abdominal mass x1 day    Physical Exam Triage Vital Signs ED Triage Vitals  Enc Vitals Group     BP 10/28/21 1200 (!) 152/81     Pulse Rate 10/28/21 1200 90     Resp 10/28/21 1200 20     Temp 10/28/21 1200 97.8 F (36.6 C)     Temp Source 10/28/21 1200 Oral     SpO2 10/28/21 1200 98 %     Weight 10/28/21 1157 260 lb (117.9 kg)     Height 10/28/21 1157 6\' 2"  (1.88 m)     Head Circumference --      Peak Flow --      Pain Score 10/28/21 1156 2     Pain Loc --      Pain Edu? --      Excl. in [place]? --    No data found.  Updated Vital Signs BP (!) 152/81 (BP Location: Right Arm)    Pulse 90  Temp 97.8 F (36.6 C) (Oral)    Resp 20    Ht 6\' 2"  (1.88 m)    Wt  260 lb (117.9 kg)    SpO2 98%    BMI 33.38 kg/m       Physical Exam Vitals and nursing note reviewed.  Constitutional:      General: He is not in acute distress.    Appearance: Normal appearance. He is obese. He is not ill-appearing.  HENT:     Head: Normocephalic and atraumatic.     Mouth/Throat:     Mouth: Mucous membranes are moist.     Pharynx: Oropharynx is clear.  Eyes:     Extraocular Movements: Extraocular movements intact.     Conjunctiva/sclera: Conjunctivae normal.     Pupils: Pupils are equal, round, and reactive to light.  Cardiovascular:     Rate and Rhythm: Normal rate and regular rhythm.     Pulses: Normal pulses.     Heart sounds: Normal heart sounds.  Pulmonary:     Effort: Pulmonary effort is normal.     Breath sounds: Normal breath sounds.  Abdominal:     General: Bowel sounds are normal. There is no distension.     Palpations: There is no mass.     Tenderness: There is no abdominal tenderness. There is no right CVA tenderness, left CVA tenderness, guarding or rebound.     Hernia: A hernia is present.     Comments: 18 cm x 11 cm ventral/abdominal hernia noted, non-TTP, reducible with palpation  Musculoskeletal:     Cervical back: Normal range of motion and neck supple.  Neurological:     Mental Status: He is alert.     UC Treatments / Results  Labs (all labs ordered are listed, but only abnormal results are displayed) Labs Reviewed - No data to display  EKG   Radiology No results found.  Procedures Procedures (including critical care time)  Medications Ordered in UC Medications - No data to display  Initial Impression / Assessment and Plan / UC Course  I have reviewed the triage vital signs and the nursing notes.  Pertinent labs & imaging results that were available during my care of the patient were reviewed by me and considered in my medical decision making (see chart for details).     MDM: 1. Abdominal wall  hernia-Advised/encouraged patient to follow-up with PCP for further evaluation and/or referral to general surgery for abdominal hernia consult.  Patient discharged home, hemodynamically stable.  Final Clinical Impressions(s) / UC Diagnoses   Final diagnoses:  Abdominal wall hernia     Discharge Instructions      Advised/encouraged patient to follow-up with PCP for further evaluation and/or referral to general surgery for abdominal hernia consult.     ED Prescriptions   None    PDMP not reviewed this encounter.   Eliezer Lofts, [place] 10/28/21 1313

## 2022-10-23 ENCOUNTER — Other Ambulatory Visit: Payer: Self-pay | Admitting: Orthopedic Surgery

## 2022-10-23 DIAGNOSIS — M75101 Unspecified rotator cuff tear or rupture of right shoulder, not specified as traumatic: Secondary | ICD-10-CM

## 2022-11-01 ENCOUNTER — Ambulatory Visit
Admission: RE | Admit: 2022-11-01 | Discharge: 2022-11-01 | Disposition: A | Discharge status: Home / Self Care | Payer: Medicare Other | Source: Ambulatory Visit | Attending: Orthopedic Surgery | Admitting: Orthopedic Surgery

## 2022-11-01 DIAGNOSIS — M75101 Unspecified rotator cuff tear or rupture of right shoulder, not specified as traumatic: Secondary | ICD-10-CM

## 2022-11-09 NOTE — Patient Instructions (Signed)
SURGICAL WAITING ROOM VISITATION  Patients having surgery or a procedure may have no more than 2 support people in the waiting area - these visitors may rotate.    Children under the age of 64 must have an adult with them who is not the patient.  Due to an increase in RSV and influenza rates and associated hospitalizations, children ages 43 and under may not visit patients in [facility] hospitals.  If the patient needs to stay at the hospital during part of their recovery, the visitor guidelines for inpatient rooms apply. Pre-op nurse will coordinate an appropriate time for 1 support person to accompany patient in pre-op.  This support person may not rotate.    Please refer to the [facility] website for the visitor guidelines for Inpatients (after your surgery is over and you are in a regular room).       Your procedure is scheduled on: 11-23-22    Report to [facility] Main Entrance    Report to admitting at      1145  AM   Call this number if you have problems the morning of surgery [phone]   Do not eat food :After Midnight.   After Midnight you may have the following liquids until __1110____ AM/ PM DAY OF SURGERY    THEN NOTHING BY MOUTH  Water Non-Citrus Juices (without pulp, NO RED-Apple, White grape, White cranberry) Black Coffee (NO MILK/CREAM OR CREAMERS, sugar ok)  Clear Tea (NO MILK/CREAM OR CREAMERS, sugar ok) regular and decaf                             Plain Jell-O (NO RED)                                           Fruit ices (not with fruit pulp, NO RED)                                     Popsicles (NO RED)                                                               Sports drinks like Gatorade (NO RED)                      The day of surgery:  Drink ONE (1) Pre-Surgery Clear Ensure  at 1100 AM the morning of surgery. Drink in one sitting. Do not sip.  This drink was given to you during your hospital  pre-op appointment visit. Nothing else to  drink after completing the  Pre-Surgery Clear Ensure           If you have questions, please contact your surgeon's office.   FOLLOW ANY ADDITIONAL PRE OP INSTRUCTIONS YOU RECEIVED FROM YOUR SURGEON'S OFFICE!!!     Oral Hygiene is also important to reduce your risk of infection.  Remember - BRUSH YOUR TEETH THE MORNING OF SURGERY WITH YOUR REGULAR TOOTHPASTE  DENTURES WILL BE REMOVED PRIOR TO SURGERY PLEASE DO NOT APPLY "Poly grip" OR ADHESIVES!!!   Do NOT smoke after Midnight   Take these medicines the morning of surgery with A SIP OF WATER: sertraline, pantoprazole, amlodipine, tylenol if needed                                  You may not have any metal on your body including hair pins, jewelry, and body piercing             Do not wear lotions, powders, perfumes/cologne, or deodorant               Men may shave face and neck.   Do not bring valuables to the hospital. [facility] IS NOT             RESPONSIBLE   FOR VALUABLES.   Contacts, glasses, dentures or bridgework may not be worn into surgery.   Bring small overnight bag day of surgery.   DO NOT BRING YOUR HOME MEDICATIONS TO THE HOSPITAL. PHARMACY WILL DISPENSE MEDICATIONS LISTED ON YOUR MEDICATION LIST TO YOU DURING YOUR ADMISSION IN THE HOSPITAL!    Patients discharged on the day of surgery will not be allowed to drive home.  Someone NEEDS to stay with you for the first 24 hours after anesthesia.                 Please read over the following fact sheets you were given: IF YOU HAVE QUESTIONS ABOUT YOUR PRE-OP INSTRUCTIONS PLEASE CALL [phone]   If you received a COVID test during your pre-op visit  it is requested that you wear a mask when out in public, stay away from anyone that may not be feeling well and notify your surgeon if you develop symptoms. If you test positive for Covid or have been in contact with anyone that has tested positive in the last 10 days please  notify you surgeon.    [facility] - Preparing for Surgery Before surgery, you can play an important role.  Because skin is not sterile, your skin needs to be as free of germs as possible.  You can reduce the number of germs on your skin by washing with CHG (chlorahexidine gluconate) soap before surgery.  CHG is an antiseptic cleaner which kills germs and bonds with the skin to continue killing germs even after washing. Please DO NOT use if you have an allergy to CHG or antibacterial soaps.  If your skin becomes reddened/irritated stop using the CHG and inform your nurse when you arrive at Short Stay. Do not shave (including legs and underarms) for at least 48 hours prior to the first CHG shower.  You may shave your face/neck. Please follow these instructions carefully:  1.  Shower with CHG Soap the night before surgery and the  morning of Surgery.  2.  If you choose to wash your hair, wash your hair first as usual with your  normal  shampoo.  3.  After you shampoo, rinse your hair and body thoroughly to remove the  shampoo.                           4.  Use CHG as you would any other liquid soap.  You can apply chg directly  to the skin and wash                       Gently with a scrungie or clean washcloth.  5.  Apply the CHG Soap to your body ONLY FROM THE NECK DOWN.   Do not use on face/ open                           Wound or open sores. Avoid contact with eyes, ears mouth and genitals (private parts).                       Wash face,  Genitals (private parts) with your normal soap.             6.  Wash thoroughly, paying special attention to the area where your surgery  will be performed.  7.  Thoroughly rinse your body with warm water from the neck down.  8.  DO NOT shower/wash with your normal soap after using and rinsing off  the CHG Soap.                9.  Pat yourself dry with a clean towel.            10.  Wear clean pajamas.            11.  Place clean sheets on your bed the night  of your first shower and do not  sleep with pets. Day of Surgery : Do not apply any lotions/deodorants the morning of surgery.  Please wear clean clothes to the hospital/surgery center.  FAILURE TO FOLLOW THESE INSTRUCTIONS MAY RESULT IN THE CANCELLATION OF YOUR SURGERY PATIENT SIGNATURE_________________________________  NURSE SIGNATURE__________________________________  ________________________________________________________________________  Allen Robertson  An incentive spirometer is a tool that can help keep your lungs clear and active. This tool measures how well you are filling your lungs with each breath. Taking long deep breaths may help reverse or decrease the chance of developing breathing (pulmonary) problems (especially infection) following: A long period of time when you are unable to move or be active. BEFORE THE PROCEDURE  If the spirometer includes an indicator to show your best effort, your nurse or respiratory therapist will set it to a desired goal. If possible, sit up straight or lean slightly forward. Try not to slouch. Hold the incentive spirometer in an upright position. INSTRUCTIONS FOR USE  Sit on the edge of your bed if possible, or sit up as far as you can in bed or on a chair. Hold the incentive spirometer in an upright position. Breathe out normally. Place the mouthpiece in your mouth and seal your lips tightly around it. Breathe in slowly and as deeply as possible, raising the piston or the ball toward the top of the column. Hold your breath for 3-5 seconds or for as long as possible. Allow the piston or ball to fall to the bottom of the column. Remove the mouthpiece from your mouth and breathe out normally. Rest for a few seconds and repeat Steps 1 through 7 at least 10 times every 1-2 hours when you are awake. Take your time and take a few normal breaths between deep breaths. The spirometer may include an indicator to show your best effort. Use the  indicator as a goal to work toward during each repetition. After each set of 10 deep breaths, practice coughing to be sure your lungs are clear.  If you have an incision (the cut made at the time of surgery), support your incision when coughing by placing a pillow or rolled up towels firmly against it. Once you are able to get out of bed, walk around indoors and cough well. You may stop using the incentive spirometer when instructed by your caregiver.  RISKS AND COMPLICATIONS Take your time so you do not get dizzy or light-headed. If you are in pain, you may need to take or ask for pain medication before doing incentive spirometry. It is harder to take a deep breath if you are having pain. AFTER USE Rest and breathe slowly and easily. It can be helpful to keep track of a log of your progress. Your caregiver can provide you with a simple table to help with this. If you are using the spirometer at home, follow these instructions: SEEK MEDICAL CARE IF:  You are having difficultly using the spirometer. You have trouble using the spirometer as often as instructed. Your pain medication is not giving enough relief while using the spirometer. You develop fever of 100.5 F (38.1 C) or higher. SEEK IMMEDIATE MEDICAL CARE IF:  You cough up bloody sputum that had not been present before. You develop fever of 102 F (38.9 C) or greater. You develop worsening pain at or near the incision site. MAKE SURE YOU:  Understand these instructions. Will watch your condition. Will get help right away if you are not doing well or get worse. Document Released: 02/11/2007 Document Revised: 12/24/2011 Document Reviewed: 04/14/2007 [facility] Patient Information 2014 [place], [place].   ________________________________________________________________________

## 2022-11-09 NOTE — Progress Notes (Addendum)
PCP - Dr. Corky Sing 11-06-22 on chart lov 11-05-22 on chart Cardiologist - lov note Roslynn Amble 01-24-21 epic  PPM/ICD -  Device Orders -  Rep Notified -   Chest x-ray -  EKG - 11-06-22 on chart Stress Test -  ECHO - 2018 epic Cardiac Cath - 2020 epic CMP,CBC,LIPID 11-06-22 ON CHART  Sleep Study -  CPAP -   Fasting Blood Sugar -  Checks Blood Sugar _____ times a day  Blood Thinner Instructions: Aspirin Instructions:  ERAS Protcol - PRE-SURGERY Ensure   COVID TEST-  COVID vaccine -  Activity-- Anesthesia review: mild non obstructive CAD, HTN  Patient denies shortness of breath, fever, cough and chest pain at PAT appointment   All instructions explained to the patient, with a verbal understanding of the material. Patient agrees to go over the instructions while at home for a better understanding. Patient also instructed to self quarantine after being tested for COVID-19. The opportunity to ask questions was provided.

## 2022-11-12 ENCOUNTER — Ambulatory Visit (INDEPENDENT_AMBULATORY_CARE_PROVIDER_SITE_OTHER): Payer: Medicare Other

## 2022-11-12 ENCOUNTER — Ambulatory Visit (INDEPENDENT_AMBULATORY_CARE_PROVIDER_SITE_OTHER): Payer: Medicare Other | Admitting: Sports Medicine

## 2022-11-12 DIAGNOSIS — M1612 Unilateral primary osteoarthritis, left hip: Secondary | ICD-10-CM | POA: Diagnosis not present

## 2022-11-12 MED ORDER — TRIAMCINOLONE ACETONIDE 40 MG/ML IJ SUSP
40.0000 mg | Freq: Once | INTRAMUSCULAR | Status: AC
  Administered 2022-11-12: 40 mg via INTRAMUSCULAR

## 2022-11-12 NOTE — Progress Notes (Signed)
    Procedures performed today:    Procedure: Real-time Ultrasound Guided injection of the left hip joint Device: Samsung HS60  Verbal informed consent obtained.  Time-out conducted.  Noted no overlying erythema, induration, or other signs of local infection.  Skin prepped in a sterile fashion.  Local anesthesia: Topical Ethyl chloride.  With sterile technique and under real time ultrasound guidance: Arthritic joint noted, 1 cc Kenalog 40, 2 cc lidocaine, 2 cc bupivacaine injected easily Completed without difficulty  Advised to call if fevers/chills, erythema, induration, drainage, or persistent bleeding.  Images permanently stored and available for review in PACS.  Impression: Technically successful ultrasound guided injection.  Independent interpretation of notes and tests performed by another provider:   None.  Brief History, Exam, Impression, and Recommendations:    Primary osteoarthritis of left hip Allen Robertson returns, he is a very pleasant 72 year old male, he had a history of trochanteric bursitis that resolved after an injection back in 2019, he also was having pain deep in the buttock posterior to the greater trochanter, x-rays did show some lumbar DDD, he had reproduction of pain with internal rotation of the hip suggesting the hip joint was the pain generator, I did a hip joint injection back in July 2022, and he did extremely well until recently, now with recurrence of pain we will reinject his left hip joint, return to see me as needed.    ____________________________________________ Gwen Her. Dianah Field, M.D., ABFM., CAQSM., AME. Primary Care and Sports Medicine [facility]  Adjunct Professor of [employer]  Risk manager

## 2022-11-12 NOTE — Addendum Note (Signed)
Addended by: Tarri Glenn A on: 11/12/2022 11:07 AM   Modules accepted: Orders

## 2022-11-12 NOTE — Assessment & Plan Note (Signed)
Allen Robertson returns, he is a very pleasant 72 year old male, he had a history of trochanteric bursitis that resolved after an injection back in 2019, he also was having pain deep in the buttock posterior to the greater trochanter, x-rays did show some lumbar DDD, he had reproduction of pain with internal rotation of the hip suggesting the hip joint was the pain generator, I did a hip joint injection back in July 2022, and he did extremely well until recently, now with recurrence of pain we will reinject his left hip joint, return to see me as needed.

## 2022-11-13 ENCOUNTER — Other Ambulatory Visit: Payer: Self-pay

## 2022-11-13 ENCOUNTER — Encounter (HOSPITAL_COMMUNITY): Payer: Self-pay

## 2022-11-13 ENCOUNTER — Encounter (HOSPITAL_COMMUNITY)
Admission: RE | Admit: 2022-11-13 | Discharge: 2022-11-13 | Disposition: A | Discharge status: Home / Self Care | Payer: Medicare Other | Source: Ambulatory Visit | Attending: Orthopedic Surgery | Admitting: Orthopedic Surgery

## 2022-11-13 VITALS — BP 149/91 | HR 79 | Temp 98.2°F | Resp 16 | Ht 74.0 in | Wt 255.0 lb

## 2022-11-13 DIAGNOSIS — Z01818 Encounter for other preprocedural examination: Secondary | ICD-10-CM

## 2022-11-13 DIAGNOSIS — Z01812 Encounter for preprocedural laboratory examination: Secondary | ICD-10-CM | POA: Insufficient documentation

## 2022-11-13 HISTORY — DX: Unspecified osteoarthritis, unspecified site: M19.90

## 2022-11-13 HISTORY — DX: Anxiety disorder, unspecified: F41.9

## 2022-11-13 HISTORY — DX: Essential (primary) hypertension: I10

## 2022-11-13 LAB — SURGICAL PCR SCREEN
MRSA, PCR: NEGATIVE
Staphylococcus aureus: NEGATIVE

## 2022-11-23 ENCOUNTER — Other Ambulatory Visit: Payer: Self-pay

## 2022-11-23 ENCOUNTER — Ambulatory Visit (HOSPITAL_COMMUNITY): Payer: Medicare Other

## 2022-11-23 ENCOUNTER — Ambulatory Visit (HOSPITAL_COMMUNITY): Payer: Medicare Other | Admitting: Physician Assistant

## 2022-11-23 ENCOUNTER — Encounter (HOSPITAL_COMMUNITY)
Admission: RE | Disposition: A | Discharge status: Home / Self Care | Payer: Self-pay | Source: Ambulatory Visit | Attending: Orthopedic Surgery

## 2022-11-23 ENCOUNTER — Encounter (HOSPITAL_COMMUNITY): Payer: Self-pay | Admitting: Orthopedic Surgery

## 2022-11-23 ENCOUNTER — Ambulatory Visit (HOSPITAL_COMMUNITY)
Admission: RE | Admit: 2022-11-23 | Discharge: 2022-11-23 | Disposition: A | Discharge status: Home / Self Care | Payer: Medicare Other | Source: Ambulatory Visit | Attending: Orthopedic Surgery | Admitting: Orthopedic Surgery

## 2022-11-23 ENCOUNTER — Ambulatory Visit (HOSPITAL_BASED_OUTPATIENT_CLINIC_OR_DEPARTMENT_OTHER): Payer: Medicare Other | Admitting: Anesthesiology

## 2022-11-23 DIAGNOSIS — M1990 Unspecified osteoarthritis, unspecified site: Secondary | ICD-10-CM | POA: Diagnosis not present

## 2022-11-23 DIAGNOSIS — K219 Gastro-esophageal reflux disease without esophagitis: Secondary | ICD-10-CM | POA: Insufficient documentation

## 2022-11-23 DIAGNOSIS — M12811 Other specific arthropathies, not elsewhere classified, right shoulder: Secondary | ICD-10-CM | POA: Diagnosis not present

## 2022-11-23 DIAGNOSIS — Z01818 Encounter for other preprocedural examination: Secondary | ICD-10-CM

## 2022-11-23 DIAGNOSIS — Z87891 Personal history of nicotine dependence: Secondary | ICD-10-CM | POA: Insufficient documentation

## 2022-11-23 DIAGNOSIS — I10 Essential (primary) hypertension: Secondary | ICD-10-CM | POA: Diagnosis not present

## 2022-11-23 DIAGNOSIS — R519 Headache, unspecified: Secondary | ICD-10-CM | POA: Diagnosis not present

## 2022-11-23 DIAGNOSIS — F419 Anxiety disorder, unspecified: Secondary | ICD-10-CM | POA: Insufficient documentation

## 2022-11-23 DIAGNOSIS — M75101 Unspecified rotator cuff tear or rupture of right shoulder, not specified as traumatic: Secondary | ICD-10-CM | POA: Insufficient documentation

## 2022-11-23 HISTORY — PX: REVERSE SHOULDER ARTHROPLASTY: SHX5054

## 2022-11-23 SURGERY — ARTHROPLASTY, SHOULDER, TOTAL, REVERSE
Anesthesia: Regional | Site: Shoulder | Laterality: Right

## 2022-11-23 MED ORDER — TRANEXAMIC ACID-NACL 1000-0.7 MG/100ML-% IV SOLN
1000.0000 mg | INTRAVENOUS | Status: AC
  Administered 2022-11-23: 1000 mg via INTRAVENOUS
  Filled 2022-11-23: qty 100

## 2022-11-23 MED ORDER — EPHEDRINE SULFATE-NACL 50-0.9 MG/10ML-% IV SOSY
PREFILLED_SYRINGE | INTRAVENOUS | Status: DC | PRN
  Administered 2022-11-23 (×3): 5 mg via INTRAVENOUS
  Administered 2022-11-23: 10 mg via INTRAVENOUS

## 2022-11-23 MED ORDER — FENTANYL CITRATE (PF) 100 MCG/2ML IJ SOLN
INTRAMUSCULAR | Status: AC
  Filled 2022-11-23: qty 2

## 2022-11-23 MED ORDER — ROCURONIUM BROMIDE 10 MG/ML (PF) SYRINGE
PREFILLED_SYRINGE | INTRAVENOUS | Status: DC | PRN
  Administered 2022-11-23: 60 mg via INTRAVENOUS

## 2022-11-23 MED ORDER — FENTANYL CITRATE (PF) 100 MCG/2ML IJ SOLN
INTRAMUSCULAR | Status: DC | PRN
  Administered 2022-11-23 (×2): 50 ug via INTRAVENOUS

## 2022-11-23 MED ORDER — 0.9 % SODIUM CHLORIDE (POUR BTL) OPTIME
TOPICAL | Status: DC | PRN
  Administered 2022-11-23: 1000 mL

## 2022-11-23 MED ORDER — PHENYLEPHRINE 80 MCG/ML (10ML) SYRINGE FOR IV PUSH (FOR BLOOD PRESSURE SUPPORT)
PREFILLED_SYRINGE | INTRAVENOUS | Status: AC
  Filled 2022-11-23: qty 10

## 2022-11-23 MED ORDER — VANCOMYCIN HCL 1000 MG IV SOLR
INTRAVENOUS | Status: AC
  Filled 2022-11-23: qty 20

## 2022-11-23 MED ORDER — CHLORHEXIDINE GLUCONATE 0.12 % MT SOLN
15.0000 mL | Freq: Once | OROMUCOSAL | Status: AC
  Administered 2022-11-23: 15 mL via OROMUCOSAL

## 2022-11-23 MED ORDER — MIDAZOLAM HCL 2 MG/2ML IJ SOLN
1.0000 mg | INTRAMUSCULAR | Status: DC
  Filled 2022-11-23: qty 2

## 2022-11-23 MED ORDER — STERILE WATER FOR IRRIGATION IR SOLN
Status: DC | PRN
  Administered 2022-11-23: 2000 mL

## 2022-11-23 MED ORDER — OXYCODONE HCL 5 MG PO TABS: 5.0000 mg | ORAL_TABLET | Freq: Four times a day (QID) | ORAL | 0 refills | Status: DC | PRN

## 2022-11-23 MED ORDER — SUGAMMADEX SODIUM 500 MG/5ML IV SOLN
INTRAVENOUS | Status: AC
  Filled 2022-11-23: qty 5

## 2022-11-23 MED ORDER — BUPIVACAINE LIPOSOME 1.3 % IJ SUSP
INTRAMUSCULAR | Status: DC | PRN
  Administered 2022-11-23: 10 mL via PERINEURAL

## 2022-11-23 MED ORDER — BUPIVACAINE HCL (PF) 0.5 % IJ SOLN
INTRAMUSCULAR | Status: DC | PRN
  Administered 2022-11-23: 15 mL via PERINEURAL

## 2022-11-23 MED ORDER — DEXAMETHASONE SODIUM PHOSPHATE 10 MG/ML IJ SOLN
INTRAMUSCULAR | Status: DC | PRN
  Administered 2022-11-23: 10 mg via INTRAVENOUS

## 2022-11-23 MED ORDER — LIDOCAINE 2% (20 MG/ML) 5 ML SYRINGE
INTRAMUSCULAR | Status: DC | PRN
  Administered 2022-11-23: 40 mg via INTRAVENOUS

## 2022-11-23 MED ORDER — FENTANYL CITRATE PF 50 MCG/ML IJ SOSY
50.0000 ug | PREFILLED_SYRINGE | INTRAMUSCULAR | Status: DC
  Administered 2022-11-23: 100 ug via INTRAVENOUS
  Filled 2022-11-23: qty 2

## 2022-11-23 MED ORDER — CEFAZOLIN SODIUM-DEXTROSE 2-4 GM/100ML-% IV SOLN
2.0000 g | INTRAVENOUS | Status: AC
  Administered 2022-11-23: 2 g via INTRAVENOUS
  Filled 2022-11-23: qty 100

## 2022-11-23 MED ORDER — ROCURONIUM BROMIDE 10 MG/ML (PF) SYRINGE
PREFILLED_SYRINGE | INTRAVENOUS | Status: AC
  Filled 2022-11-23: qty 10

## 2022-11-23 MED ORDER — LACTATED RINGERS IV SOLN: INTRAVENOUS | Status: DC

## 2022-11-23 MED ORDER — VANCOMYCIN HCL 1000 MG IV SOLR
INTRAVENOUS | Status: DC | PRN
  Administered 2022-11-23: 1000 mg via TOPICAL

## 2022-11-23 MED ORDER — PHENYLEPHRINE HCL (PRESSORS) 10 MG/ML IV SOLN
INTRAVENOUS | Status: AC
  Filled 2022-11-23: qty 2

## 2022-11-23 MED ORDER — DEXAMETHASONE SODIUM PHOSPHATE 10 MG/ML IJ SOLN
INTRAMUSCULAR | Status: AC
  Filled 2022-11-23: qty 1

## 2022-11-23 MED ORDER — PROPOFOL 10 MG/ML IV BOLUS
INTRAVENOUS | Status: AC
  Filled 2022-11-23: qty 20

## 2022-11-23 MED ORDER — ONDANSETRON HCL 4 MG PO TABS: 4.0000 mg | ORAL_TABLET | Freq: Three times a day (TID) | ORAL | 0 refills | Status: DC | PRN

## 2022-11-23 MED ORDER — SUGAMMADEX SODIUM 500 MG/5ML IV SOLN
INTRAVENOUS | Status: DC | PRN
  Administered 2022-11-23: 250 mg via INTRAVENOUS

## 2022-11-23 MED ORDER — PROPOFOL 10 MG/ML IV BOLUS
INTRAVENOUS | Status: DC | PRN
  Administered 2022-11-23: 20 mg via INTRAVENOUS
  Administered 2022-11-23: 30 mg via INTRAVENOUS
  Administered 2022-11-23: 180 mg via INTRAVENOUS

## 2022-11-23 MED ORDER — ORAL CARE MOUTH RINSE: 15.0000 mL | Freq: Once | OROMUCOSAL | Status: AC

## 2022-11-23 MED ORDER — ONDANSETRON HCL 4 MG/2ML IJ SOLN
INTRAMUSCULAR | Status: DC | PRN
  Administered 2022-11-23: 4 mg via INTRAVENOUS

## 2022-11-23 MED ORDER — EPHEDRINE 5 MG/ML INJ
INTRAVENOUS | Status: AC
  Filled 2022-11-23: qty 5

## 2022-11-23 MED ORDER — ONDANSETRON HCL 4 MG/2ML IJ SOLN
INTRAMUSCULAR | Status: AC
  Filled 2022-11-23: qty 2

## 2022-11-23 MED ORDER — FENTANYL CITRATE PF 50 MCG/ML IJ SOSY: 25.0000 ug | PREFILLED_SYRINGE | INTRAMUSCULAR | Status: DC | PRN

## 2022-11-23 MED ORDER — ONDANSETRON HCL 4 MG/2ML IJ SOLN: 4.0000 mg | Freq: Four times a day (QID) | INTRAMUSCULAR | Status: DC | PRN

## 2022-11-23 SURGICAL SUPPLY — 63 items
BAG COUNTER SPONGE SURGICOUNT (BAG) IMPLANT
BAG ZIPLOCK 12X15 (MISCELLANEOUS) ×1 IMPLANT
BLADE SAG 18X100X1.27 (BLADE) ×1 IMPLANT
CLSR STERI-STRIP ANTIMIC 1/2X4 (GAUZE/BANDAGES/DRESSINGS) IMPLANT
COVER BACK TABLE 60X90IN (DRAPES) ×1 IMPLANT
COVER SURGICAL LIGHT HANDLE (MISCELLANEOUS) ×1 IMPLANT
CUP SUT UNIV REVERS 39 NEU (Shoulder) IMPLANT
DRAPE ORTHO SPLIT 77X108 STRL (DRAPES) ×2
DRAPE SHEET LG 3/4 BI-LAMINATE (DRAPES) ×1 IMPLANT
DRAPE SURG 17X11 SM STRL (DRAPES) ×1 IMPLANT
DRAPE SURG ORHT 6 SPLT 77X108 (DRAPES) ×2 IMPLANT
DRAPE TOP 10253 STERILE (DRAPES) ×1 IMPLANT
DRAPE U-SHAPE 47X51 STRL (DRAPES) ×1 IMPLANT
DRILL SURG AR 10 (DRILL) IMPLANT
DRSG AQUACEL AG ADV 3.5X 6 (GAUZE/BANDAGES/DRESSINGS) IMPLANT
DRSG AQUACEL AG ADV 3.5X10 (GAUZE/BANDAGES/DRESSINGS) ×1 IMPLANT
DURAPREP 26ML APPLICATOR (WOUND CARE) IMPLANT
ELECT REM PT RETURN 15FT ADLT (MISCELLANEOUS) ×1 IMPLANT
FACESHIELD WRAPAROUND (MASK) ×1 IMPLANT
FACESHIELD WRAPAROUND OR TEAM (MASK) ×1 IMPLANT
GLENOID UNI REV MOD 24 +2 LAT (Joint) IMPLANT
GLENOSPHERE 39+4 LAT/24 UNI RV (Joint) IMPLANT
GLOVE BIO SURGEON STRL SZ7.5 (GLOVE) ×4 IMPLANT
GLOVE BIOGEL PI IND STRL 8 (GLOVE) ×2 IMPLANT
GOWN STRL REUS W/ TWL XL LVL3 (GOWN DISPOSABLE) ×2 IMPLANT
GOWN STRL REUS W/TWL XL LVL3 (GOWN DISPOSABLE) ×2
INSERT HUMERAL M/39 +3/CNSTRND (Miscellaneous) IMPLANT
KIT BASIN OR (CUSTOM PROCEDURE TRAY) ×1 IMPLANT
KIT TURNOVER KIT A (KITS) IMPLANT
MANIFOLD NEPTUNE II (INSTRUMENTS) ×1 IMPLANT
NDL TAPERED W/ NITINOL LOOP (MISCELLANEOUS) IMPLANT
NEEDLE TAPERED W/ NITINOL LOOP (MISCELLANEOUS) IMPLANT
NS IRRIG 1000ML POUR BTL (IV SOLUTION) ×1 IMPLANT
PACK SHOULDER (CUSTOM PROCEDURE TRAY) ×1 IMPLANT
PAD CAST 4YDX4 CTTN HI CHSV (CAST SUPPLIES) ×1 IMPLANT
PADDING CAST COTTON 4X4 STRL (CAST SUPPLIES) ×1
RESTRAINT HEAD UNIVERSAL NS (MISCELLANEOUS) IMPLANT
SCREW CENTRAL MOD 30MM (Screw) IMPLANT
SCREW GLENOSPHERE (Miscellaneous) IMPLANT
SCREW PERI LOCK 5.5X16 (Screw) IMPLANT
SCREW PERI LOCK 5.5X36 (Screw) IMPLANT
SCREW PERIPHERAL 5.5X20 LOCK (Screw) IMPLANT
SCREW PERIPHERAL 5.5X40 LOCK (Screw) IMPLANT
SLING ARM FOAM STRAP MED (SOFTGOODS) IMPLANT
SLING ARM IMMOBILIZER XL (CAST SUPPLIES) IMPLANT
SMARTMIX MINI TOWER (MISCELLANEOUS)
SPONGE T-LAP 4X18 ~~LOC~~+RFID (SPONGE) IMPLANT
STEM HUM UNIV REV SZ13 (Stem) IMPLANT
STRIP CLOSURE SKIN 1/2X4 (GAUZE/BANDAGES/DRESSINGS) ×1 IMPLANT
SUCTION FRAZIER HANDLE 10FR (MISCELLANEOUS) ×1
SUCTION TUBE FRAZIER 10FR DISP (MISCELLANEOUS) ×1 IMPLANT
SUT FIBERWIRE #2 38 T-5 BLUE (SUTURE)
SUT MNCRL AB 3-0 PS2 18 (SUTURE) ×1 IMPLANT
SUT VIC AB 0 CT1 36 (SUTURE) ×1 IMPLANT
SUT VIC AB 1 CT1 36 (SUTURE) ×1 IMPLANT
SUT VIC AB 2-0 CT1 27 (SUTURE) ×1
SUT VIC AB 2-0 CT1 TAPERPNT 27 (SUTURE) ×1 IMPLANT
SUTURE FIBERWR #2 38 T-5 BLUE (SUTURE) IMPLANT
SUTURE TAPE 1.3 40 TPR END (SUTURE) ×2 IMPLANT
SUTURETAPE 1.3 40 TPR END (SUTURE) ×2
TOWEL OR 17X26 10 PK STRL BLUE (TOWEL DISPOSABLE) ×1 IMPLANT
TOWER SMARTMIX MINI (MISCELLANEOUS) IMPLANT
TUBE SUCTION HIGH CAP CLEAR NV (SUCTIONS) ×1 IMPLANT

## 2022-11-23 NOTE — Discharge Instructions (Addendum)
Orthopedic surgery discharge instructions:  -Maintain postoperative bandage until follow-up appointment.  This is waterproof, and you may begin showering on postoperative day #3.  Do not submerge underwater.  Maintain that bandage until your follow-up appointment in 2 weeks.  -No lifting over 2 pounds with operateive arm.  You may use the arm immediately for activities of daily living such as bathing, washing your face and brushing your teeth, eating, and getting dressed.  Otherwise maintain your sling when you are out of the house and sleeping.  -Apply ice liberally to the shoulder throughout the day.  For mild to moderate pain use Tylenol and Advil as needed around-the-clock.  For breakthrough pain use oxycodone as necessary.  -You will return to see Dr. Rogers in the office in 2 weeks for routine postoperative check with x-rays.  

## 2022-11-23 NOTE — Op Note (Signed)
11/23/2022  3:07 PM  PATIENT:  Allen Robertson    PRE-OPERATIVE DIAGNOSIS:  right shoulder rotator cuff arthropathy  POST-OPERATIVE DIAGNOSIS:  Same  PROCEDURE:  Right REVERSE SHOULDER ARTHROPLASTY  SURGEON:  Nicholes Stairs, MD  ASSISTANT: Jonelle Sidle, PA-C  Assistant attestation:  PA McClung present for the entire procedure.  ANESTHESIA:   General with interscalene  ESTIMATED BLOOD LOSS: 100 cc  PREOPERATIVE INDICATIONS:  AUSITN MANY is a  72 y.o. male with a diagnosis of right shoulder rotator cuff arthropathty who failed conservative measures and elected for surgical management.    The risks benefits and alternatives were discussed with the patient preoperatively including but not limited to the risks of infection, bleeding, nerve injury, cardiopulmonary complications, the need for revision surgery, dislocation, brachial plexus palsy, incomplete relief of pain, among others, and the patient was willing to proceed.  OPERATIVE IMPLANTS:  Arthrex universe reverse arthroplasty system with a size 13 stem, standard humeral tray with a +3 constrained polyethylene liner 25 mm +2 mm lateralized baseplate with a 30 mm central screw and 4 peripheral locking screws.  A 39 mm +4 lateralized glenosphere   OPERATIVE FINDINGS:  Advanced rotator cuff arthropathy changes with degenerative changes of the humeral head, complete deficiency of the supraspinatus, and subscapularis.  Infraspinatus and teres minor intact.  Long head of biceps was significantly flattened and had longitudinal tears throughout the intra-articular portion.  There was moderate fluid noted in the joint consistent with rotator cuff arthropathy and no concern for any infectious process.  There were several loose bodies noted as well and will intra-articular space.  OPERATIVE PROCEDURE: The patient was brought to the operating room and placed in the supine position. General anesthesia was administered. IV  antibiotics were given. A Foley was not placed. Time out was performed. The upper extremity was prepped and draped in usual sterile fashion. The patient was in a beachchair position. Deltopectoral approach was carried out. The biceps was released. The subscapularis was released off of the bone.   I then performed circumferential releases of the humerus, and then dislocated the head, and then reamed with the reamer to the above named size.  I then applied the jig, and cut the humeral head in 30 of retroversion, and then turned my attention to the glenoid.  Deep retractors were placed, and I resected the labrum, and then placed a guidepin into the center position on the glenoid, with slight inferior inclination. I then reamed over the guidepin, and this created a small metaphyseal cancellus blush inferiorly, removing just the cartilage to the subchondral bone superiorly.  We then measured and tapped for a 30 mm central screw.  The base plate was selected and screwed into place with a nonlocking screw, and I had excellent purchase both inferiorly and superiorly. I placed a short locking screws on anterior and posterior aspects.  I then turned my attention to the glenosphere.  The glenoid sphere was completely seated, and had engagement of the [facility] taper. I then turned my attention back to the humerus.  It was also secured with a central setscrew  I sequentially broached, and then trialed, and was found to restore soft tissue tension, and it had 2 finger tightness. Therefore the above named components were selected. The shoulder felt stable throughout functional motion.  I then impacted the real prosthesis into place, as well as the real humeral tray, and reduced the shoulder. The shoulder had excellent motion, and was stable, and I irrigated the  wounds copiously.   The subscapularis was completely torn and chronically so, and thus not repaired.  I then irrigated the shoulder copiously once more, we  placed 1 g of vancomycin powder into the wound, repaired the deltopectoral interval with #2 FiberWire followed by subcutaneous Vicryl, then monocryl for the skin,  with Steri-Strips and sterile gauze for the skin. The patient was awakened and returned back in stable and satisfactory condition. There no complications and they tolerated the procedure well.  All counts were correct x2.  Transported to PACU in stable condition.  Disposition:  Allen Robertson will be nonweightbearing to the right upper extremity other than for activities of daily living.  He may begin those immediately.  Otherwise nonweightbearing for 2 weeks.  He will maintain his sling at nighttime and also when out of the house for 2 weeks.  We will see him back in the office for routine follow-up in 2 weeks.  He will be discharged home today from PACU.

## 2022-11-23 NOTE — Anesthesia Preprocedure Evaluation (Signed)
Anesthesia Evaluation  Patient identified by MRN, date of birth, ID band Patient awake    Reviewed: Allergy & Precautions, H&P , NPO status , Patient's Chart, lab work & pertinent test results  Airway Mallampati: II   Neck ROM: full    Dental   Pulmonary former smoker   breath sounds clear to auscultation       Cardiovascular hypertension,  Rhythm:regular Rate:Normal     Neuro/Psych  Headaches PSYCHIATRIC DISORDERS Anxiety        GI/Hepatic ,GERD  ,,  Endo/Other    Renal/GU      Musculoskeletal  (+) Arthritis ,    Abdominal   Peds  Hematology   Anesthesia Other Findings   Reproductive/Obstetrics                             Anesthesia Physical Anesthesia Plan  ASA: 2  Anesthesia Plan: General   Post-op Pain Management: Regional block*   Induction: Intravenous  PONV Risk Score and Plan: 2 and Ondansetron, Dexamethasone, Midazolam and Treatment may vary due to age or medical condition  Airway Management Planned: Oral ETT  Additional Equipment:   Intra-op Plan:   Post-operative Plan: Extubation in OR  Informed Consent: I have reviewed the patients History and Physical, chart, labs and discussed the procedure including the risks, benefits and alternatives for the proposed anesthesia with the patient or authorized representative who has indicated his/her understanding and acceptance.     Dental advisory given  Plan Discussed with: CRNA, Surgeon and Anesthesiologist  Anesthesia Plan Comments:        Anesthesia Quick Evaluation

## 2022-11-23 NOTE — H&P (Signed)
ORTHOPAEDIC H&P  REQUESTING PHYSICIAN: Nicholes Stairs, MD  PCP:  Malena Peer, MD  Chief Complaint: Right shoulder rotator cuff arthropathy  HPI: Allen Robertson is a 72 y.o. male who complains of right shoulder pain including weakness and difficulty with activities of daily living.  He was found to have a large/massive and irreparable superior rotator cuff tear.  He has failed conservative treatment and is here today for reverse arthroplasty.  No new complaints at this time.  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   Fracture of left clavicle    2000 / also had infection had to have IV infusion therapy    GERD (gastroesophageal reflux disease)    Headache    prescribed metoprolol for HA management   History of chicken pox    History of kidney stones    Hyperlipidemia    Hypertension    Recurrent falls    Staph skin infection    Tinnitus    Varicose veins    Past Surgical History:  Procedure Laterality Date   ablation calf     2009   BRISTOW PROCEDURE     left shoulder    CHOLECYSTECTOMY N/A 06/09/2019   Procedure: LAPAROSCOPIC CHOLECYSTECTOMY;  Surgeon: Erroll Luna, MD;  Location: [facility];  Service: General;  Laterality: N/A;   I & D EXTREMITY     right lower leg    JOINT REPLACEMENT     LEFT HEART CATH AND CORONARY ANGIOGRAPHY N/A 02/27/2019   Procedure: LEFT HEART CATH AND CORONARY ANGIOGRAPHY;  Surgeon: Troy Sine, MD;  Location: [facility];  Service: Cardiovascular;  Laterality: N/A;   LEG SURGERY Right    LITHOTRIPSY     NASAL TURBINATE REDUCTION Bilateral    with deviated septum repair   PICC LINE PLACE PERIPHERAL ([place] [facility])     history of / secondary to right lower leg infection    TONGUE SURGERY     robotic assisted with bilateral neck dissection 11/2011   TOTAL KNEE ARTHROPLASTY Right    partial   TOTAL KNEE REVISION Right 09/12/2015   Procedure: RIGHT TOTAL KNEE REVISION;   Surgeon: Paralee Cancel, MD;  Location: [facility];  Service: Orthopedics;  Laterality: Right;   Social History   Socioeconomic History   Marital status: Married    Spouse name: Not on file   Number of children: 2   Years of education: Not on file   Highest education level: Not on file  Occupational History   Occupation: Retired  Tobacco Use   Smoking status: Former    Packs/day: 1.00    Years: 9.00    Total pack years: 9.00    Types: Cigarettes    Quit date: 10/15/1980    Years since quitting: 42.1   Smokeless tobacco: Former    Types: Chew    Quit date: 10/16/2007  Vaping Use   Vaping Use: Never used  Substance and Sexual Activity   Alcohol use: Not Currently    Alcohol/week: 43.0 standard drinks of alcohol    Types: 1 Cans of beer, 42 Standard drinks or equivalent per week    Comment: 2 xper week   Drug use: No   Sexual activity: Not Currently  Other Topics Concern   Not on file  Social History Narrative   Not on file   Social Determinants of Health   Financial Resource Strain:  Not on file  Food Insecurity: Not on file  Transportation Needs: Not on file  Physical Activity: Not on file  Stress: Not on file  Social Connections: Not on file   Family History  Problem Relation Age of Onset   Hypertension Mother    Dementia Mother    Leukemia Father    Headache Father    Other Father        liver aneurysm   Colon cancer Maternal Grandmother 57   Esophageal cancer Neg Hx    Pancreatic cancer Neg Hx    Stomach cancer Neg Hx    Liver disease Neg Hx    Allergies  Allergen Reactions   Oxycodone-Acetaminophen Rash   Zolpidem Other (See Comments)   Percocet [Oxycodone-Acetaminophen] Rash   Prior to Admission medications   Medication Sig Start Date End Date Taking? Authorizing Provider  acetaminophen (TYLENOL) 500 MG tablet Take 1,000 mg by mouth every 6 (six) hours as needed for mild pain.   Yes [provider]  amLODipine (NORVASC) 5 MG tablet Take 5 mg by  mouth daily. 12/23/20  Yes [provider]  atorvastatin (LIPITOR) 10 MG tablet Take 10 mg by mouth daily at 6 PM.    Yes [provider]  celecoxib (CELEBREX) 100 MG capsule 1 to 2 capsules daily. Patient taking differently: Take 100 mg by mouth daily as needed for mild pain or moderate pain. 04/21/21  Yes Silverio Decamp, MD  Cyanocobalamin (VITAMIN B-12) 3000 MCG SUBL Place 3,000 mcg under the tongue daily.   Yes [provider]  Multiple Vitamins-Minerals (ICAPS AREDS 2 PO) Take 1 tablet by mouth 2 (two) times daily.   Yes [provider]  pantoprazole (PROTONIX) 40 MG tablet Take 1 tablet (40 mg total) by mouth 2 (two) times daily. 12/23/19  Yes Ladene Artist, MD  sertraline (ZOLOFT) 25 MG tablet Take 25 mg by mouth every morning. 05/24/20  Yes [provider]  sodium chloride (OCEAN) 0.65 % SOLN nasal spray Place 1 spray into both nostrils as needed for congestion.   Yes [provider]  SUMAtriptan (IMITREX) 50 MG tablet Take 50 mg by mouth every 2 (two) hours as needed for migraine. May repeat in 2 hours if headache persists or recurs.   Yes [provider]  traZODone (DESYREL) 100 MG tablet Take 100 mg by mouth at bedtime as needed for sleep. 06/22/22  Yes [provider]  colchicine 0.6 MG tablet Take 0.6 mg by mouth daily as needed (gout).    [provider]   No results found.  Positive ROS: All other systems have been reviewed and were otherwise negative with the exception of those mentioned in the HPI and as above.  Physical Exam: General: Alert, no acute distress Cardiovascular: No pedal edema Respiratory: No cyanosis, no use of accessory musculature GI: No organomegaly, abdomen is soft and non-tender Skin: No lesions in the area of chief complaint Neurologic: Sensation intact distally Psychiatric: Patient is competent for consent with normal mood and affect Lymphatic: No axillary or cervical  lymphadenopathy  MUSCULOSKELETAL: Right upper extremity is warm and well-perfused with no open wounds or lesions.  Neurovascular intact  Assessment: Right shoulder advanced rotator cuff arthropathy  Plan: Plan to proceed with reverse arthroplasty of the right shoulder.  We discussed again the risk and benefits of the procedure in detail.  We discussed the risk of bleeding, infection, damage to surrounding nerves and vessels, dislocation, fracture, stiffness and persistent pain as  well as the risk of anesthesia.  He has provided informed consent.  Plan will be to discharge home postoperatively from PACU.    Nicholes Stairs, MD Cell [phone]    11/23/2022 1:29 PM

## 2022-11-23 NOTE — Anesthesia Procedure Notes (Signed)
Anesthesia Regional Block: Interscalene brachial plexus block   Pre-Anesthetic Checklist: , timeout performed,  Correct Patient, Correct Site, Correct Laterality,  Correct Procedure, Correct Position, site marked,  Risks and benefits discussed,  Surgical consent,  Pre-op evaluation,  At surgeon's request and post-op pain management  Laterality: Right  Prep: chloraprep       Needles:  Injection technique: Single-shot  Needle Type: Echogenic Stimulator Needle     Needle Length: 5cm  Needle Gauge: 22     Additional Needles:   Procedures:, nerve stimulator,,,,,     Nerve Stimulator or Paresthesia:  Response: biceps flexion, 0.45 mA  Additional Responses:   Narrative:  Start time: 11/23/2022 1:12 PM End time: 11/23/2022 1:20 PM Injection made incrementally with aspirations every 5 mL.  Performed by: Personally  Anesthesiologist: Albertha Ghee, MD  Additional Notes: Functioning IV was confirmed and monitors were applied.  A 48m 22ga Arrow echogenic stimulator needle was used. Sterile prep and drape,hand hygiene and sterile gloves were used.  Negative aspiration and negative test dose prior to incremental administration of local anesthetic. The patient tolerated the procedure well.  Ultrasound guidance: relevent anatomy identified, needle position confirmed, local anesthetic spread visualized around nerve(s), vascular puncture avoided.  Image printed for medical record.

## 2022-11-23 NOTE — Anesthesia Procedure Notes (Signed)
Procedure Name: Intubation Date/Time: 11/23/2022 1:49 PM  Performed by: Sharlette Dense, CRNAPre-anesthesia Checklist: Patient identified, Emergency Drugs available, Suction available and Patient being monitored Patient Re-evaluated:Patient Re-evaluated prior to induction Oxygen Delivery Method: Circle system utilized Preoxygenation: Pre-oxygenation with 100% oxygen Induction Type: IV induction Ventilation: Mask ventilation without difficulty Laryngoscope Size: Miller and 3 Grade View: Grade III Tube type: Oral Tube size: 8.0 mm Number of attempts: 1 Airway Equipment and Method: Stylet and Oral airway Placement Confirmation: ETT inserted through vocal cords under direct vision, positive ETCO2 and breath sounds checked- equal and bilateral Secured at: 22 cm Tube secured with: Tape Dental Injury: Teeth and Oropharynx as per pre-operative assessment  Difficulty Due To: Difficult Airway- due to anterior larynx and Difficult Airway- due to reduced neck mobility

## 2022-11-23 NOTE — Transfer of Care (Signed)
Immediate Anesthesia Transfer of Care Note  Patient: Allen Robertson  Procedure(s) Performed: REVERSE SHOULDER ARTHROPLASTY (Right: Shoulder)  Patient Location: PACU  Anesthesia Type:GA combined with regional for post-op pain  Level of Consciousness: awake, alert , and oriented  Airway & Oxygen Therapy: Patient Spontanous Breathing and Patient connected to face mask oxygen  Post-op Assessment: Report given to RN and Post -op Vital signs reviewed and stable  Post vital signs: Reviewed and stable  Last Vitals:  Vitals Value Taken Time  BP 159/81 11/23/22 1534  Temp    Pulse 97 11/23/22 1535  Resp 21 11/23/22 1535  SpO2 99 % 11/23/22 1535  Vitals shown include unvalidated device data.  Last Pain:  Vitals:   11/23/22 1323  TempSrc:   PainSc: 0-No pain      Patients Stated Pain Goal: 0 ([id number])  Complications: No notable events documented.

## 2022-11-23 NOTE — Brief Op Note (Signed)
11/23/2022  3:04 PM  PATIENT:  Allen Robertson  72 y.o. male  PRE-OPERATIVE DIAGNOSIS:  right shoulder rotator cuff arthroplasty  POST-OPERATIVE DIAGNOSIS:  right shoulder rotator cuff arthroplasty  PROCEDURE:  Procedure(s) with comments: REVERSE SHOULDER ARTHROPLASTY (Right) - 120  SURGEON:  Surgeon(s) and Role:    * Stann Mainland, Elly Modena, MD - Primary  PHYSICIAN ASSISTANT: Jonelle Sidle, PA-C  ANESTHESIA:   regional and general  EBL:  100 mL   BLOOD ADMINISTERED:none  DRAINS: none   LOCAL MEDICATIONS USED:  NONE  SPECIMEN:  No Specimen  DISPOSITION OF SPECIMEN:  N/A  COUNTS:  YES  TOURNIQUET:  * No tourniquets in log *  DICTATION: .Note written in EPIC  PLAN OF CARE: Discharge to home after PACU  PATIENT DISPOSITION:  PACU - hemodynamically stable.   Delay start of Pharmacological VTE agent (>24hrs) due to surgical blood loss or risk of bleeding: not applicable

## 2022-11-26 ENCOUNTER — Encounter (HOSPITAL_COMMUNITY): Payer: Self-pay | Admitting: Orthopedic Surgery

## 2022-11-27 ENCOUNTER — Encounter (HOSPITAL_COMMUNITY): Payer: Self-pay | Admitting: Orthopedic Surgery

## 2022-11-27 NOTE — Anesthesia Postprocedure Evaluation (Signed)
Anesthesia Post Note  Patient: Allen Robertson  Procedure(s) Performed: REVERSE SHOULDER ARTHROPLASTY (Right: Shoulder)     Patient location during evaluation: PACU Anesthesia Type: Regional and General Level of consciousness: awake and alert Pain management: pain level controlled Vital Signs Assessment: post-procedure vital signs reviewed and stable Respiratory status: spontaneous breathing, nonlabored ventilation, respiratory function stable and patient connected to nasal cannula oxygen Cardiovascular status: blood pressure returned to baseline and stable Postop Assessment: no apparent nausea or vomiting Anesthetic complications: no   No notable events documented.  Last Vitals:  Vitals:   11/23/22 1600 11/23/22 1615  BP: (!) 152/79 (!) 157/80  Pulse: 97 97  Resp: 19 14  Temp:  36.9 C  SpO2: 92% 93%    Last Pain:  Vitals:   11/23/22 1615  TempSrc:   PainSc: 0-No pain                 Adalai Perl S

## 2023-02-26 ENCOUNTER — Ambulatory Visit (INDEPENDENT_AMBULATORY_CARE_PROVIDER_SITE_OTHER): Payer: Medicare Other | Admitting: Family Medicine

## 2023-02-26 ENCOUNTER — Encounter: Payer: Self-pay | Admitting: Family Medicine

## 2023-02-26 VITALS — BP 134/68 | HR 70 | Ht 74.0 in | Wt 259.0 lb

## 2023-02-26 DIAGNOSIS — M109 Gout, unspecified: Secondary | ICD-10-CM | POA: Insufficient documentation

## 2023-02-26 DIAGNOSIS — K219 Gastro-esophageal reflux disease without esophagitis: Secondary | ICD-10-CM

## 2023-02-26 DIAGNOSIS — E7800 Pure hypercholesterolemia, unspecified: Secondary | ICD-10-CM | POA: Diagnosis not present

## 2023-02-26 DIAGNOSIS — G2581 Restless legs syndrome: Secondary | ICD-10-CM

## 2023-02-26 DIAGNOSIS — F419 Anxiety disorder, unspecified: Secondary | ICD-10-CM

## 2023-02-26 DIAGNOSIS — F5101 Primary insomnia: Secondary | ICD-10-CM

## 2023-02-26 DIAGNOSIS — H259 Unspecified age-related cataract: Secondary | ICD-10-CM | POA: Insufficient documentation

## 2023-02-26 DIAGNOSIS — Q762 Congenital spondylolisthesis: Secondary | ICD-10-CM | POA: Insufficient documentation

## 2023-02-26 DIAGNOSIS — M1A0720 Idiopathic chronic gout, left ankle and foot, without tophus (tophi): Secondary | ICD-10-CM

## 2023-02-26 DIAGNOSIS — E669 Obesity, unspecified: Secondary | ICD-10-CM | POA: Insufficient documentation

## 2023-02-26 DIAGNOSIS — R911 Solitary pulmonary nodule: Secondary | ICD-10-CM

## 2023-02-26 DIAGNOSIS — I10 Essential (primary) hypertension: Secondary | ICD-10-CM

## 2023-02-26 DIAGNOSIS — H3530 Unspecified macular degeneration: Secondary | ICD-10-CM

## 2023-02-26 DIAGNOSIS — G4700 Insomnia, unspecified: Secondary | ICD-10-CM | POA: Insufficient documentation

## 2023-02-26 NOTE — Assessment & Plan Note (Signed)
Followed at the [facility].  He had a CT thorax on January 30, 2023, showing stable 4 mm left upper lobe pulmonary nodule.

## 2023-02-26 NOTE — Patient Instructions (Signed)
See if you can find if you were given the Prevnar 20 vaccine.  This is the newest and latest pneumonia vaccine available.  It is taking place of the Prevnar 13 that you had about 5 years ago.

## 2023-02-26 NOTE — Assessment & Plan Note (Signed)
Discussed decreasing PPI down to once a day and maybe even every other day.  He says in fact recently he traveled for vacation and missed his medication for most a week and actually had no symptoms.

## 2023-02-26 NOTE — Assessment & Plan Note (Signed)
Prior history of mild coronary artery disease and atherosclerosis he is on a statin.

## 2023-02-26 NOTE — Assessment & Plan Note (Signed)
Followed every 6 months at the [facility].

## 2023-02-26 NOTE — Assessment & Plan Note (Signed)
No recent flares or exacerbations.  

## 2023-02-26 NOTE — Assessment & Plan Note (Signed)
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

## 2023-02-26 NOTE — Assessment & Plan Note (Signed)
Currently on sertraline 50 mg he has been on it for 3 years and feels like he is doing well with it.

## 2023-02-26 NOTE — Progress Notes (Signed)
New Patient Office Visit  Subjective    Patient ID: Allen Robertson, male    DOB: [birth year]  Age: 72 y.o. MRN: [id number]  CC:  Chief Complaint  Patient presents with   Establish Care         HPI Allen Robertson presents to establish care  He follows at the [facility] for most of his care but he does have an outside PCP.  He has a history of hypertension and is currently on amlodipine.  He had a catheterization years ago where they noted 25% atherosclerosis in the coronaries he did not require any intervention at that time and they ended up diagnosing him with GERD.  But he was started on a statin. He has a history of gout in his left foot and has an occasional flare but it is not very often. He has a diagnosis of anxiety and is currently on a low-dose of sertraline which has been on for about 3 years.  He has been happy with his regimen.  He has a history of bilateral shoulder dislocation and right knee dislocation.  He is already had a right knee replacement and this year underwent a shoulder replacement.  GERD-he does take his PPI once or twice daily.  Most the time he just takes it once he does pretty well he feels like his symptoms are currently well-controlled.  He does walk for exercise typically 5 days a week walking approximately 3 miles.  He does have a cyst that they are following via chest x-ray at the [place].  He had a recent follow-up and was told that it was stable.  He had labs done at the [place] last fall.  He thinks may be around September.  Colonoscopy is up-to-date.  Performed October 2018.  Due in 10 years from prior.   Outpatient Encounter Medications as of 02/26/2023  Medication Sig   acetaminophen (TYLENOL) 500 MG tablet Take 1,000 mg by mouth every 6 (six) hours as needed for mild pain.   amLODipine (NORVASC) 5 MG tablet Take 5 mg by mouth daily.   atorvastatin (LIPITOR) 10 MG tablet Take 10 mg by mouth daily at 6 PM.    celecoxib (CELEBREX) 100 MG capsule 1 to 2  capsules daily. (Patient taking differently: Take 100 mg by mouth daily as needed for mild pain or moderate pain.)   colchicine 0.6 MG tablet Take 0.6 mg by mouth daily as needed (gout).   Cyanocobalamin (VITAMIN B-12) 3000 MCG SUBL Place 3,000 mcg under the tongue daily.   Multiple Vitamins-Minerals (ICAPS AREDS 2 PO) Take 1 tablet by mouth 2 (two) times daily.   pantoprazole (PROTONIX) 40 MG tablet Take 1 tablet (40 mg total) by mouth 2 (two) times daily.   sertraline (ZOLOFT) 25 MG tablet Take 25 mg by mouth every morning.   sodium chloride (OCEAN) 0.65 % SOLN nasal spray Place 1 spray into both nostrils as needed for congestion.   SUMAtriptan (IMITREX) 50 MG tablet Take 50 mg by mouth every 2 (two) hours as needed for migraine. May repeat in 2 hours if headache persists or recurs.   traZODone (DESYREL) 100 MG tablet Take 100 mg by mouth at bedtime as needed for sleep.   [DISCONTINUED] ondansetron (ZOFRAN) 4 MG tablet Take 1 tablet (4 mg total) by mouth every 8 (eight) hours as needed for vomiting or nausea.   [DISCONTINUED] oxyCODONE (ROXICODONE) 5 MG immediate release tablet Take 1 tablet (5 mg total) by mouth every 6 (six)  hours as needed for severe pain or breakthrough pain.   No facility-administered encounter medications on file as of 02/26/2023.    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   Fracture of left clavicle    2000 / also had infection had to have IV infusion therapy    GERD (gastroesophageal reflux disease)    Headache    prescribed metoprolol for HA management   History of chicken pox    History of kidney stones    Hyperlipidemia    Hypertension    Recurrent falls    Staph skin infection    Tinnitus    Varicose veins     Past Surgical History:  Procedure Laterality Date   ablation calf     2009   BRISTOW PROCEDURE     left shoulder    CHOLECYSTECTOMY N/A 06/09/2019   Procedure: LAPAROSCOPIC  CHOLECYSTECTOMY;  Surgeon: Harriette Bouillon, MD;  Location: [facility] OR;  Service: General;  Laterality: N/A;   I & D EXTREMITY     right lower leg    JOINT REPLACEMENT     LEFT HEART CATH AND CORONARY ANGIOGRAPHY N/A 02/27/2019   Procedure: LEFT HEART CATH AND CORONARY ANGIOGRAPHY;  Surgeon: Lennette Bihari, MD;  Location: [facility] INVASIVE CV LAB;  Service: Cardiovascular;  Laterality: N/A;   LEG SURGERY Right    LITHOTRIPSY     NASAL TURBINATE REDUCTION Bilateral    with deviated septum repair   PICC LINE PLACE PERIPHERAL ([facility] HX)     history of / secondary to right lower leg infection    REVERSE SHOULDER ARTHROPLASTY Right 11/23/2022   Procedure: REVERSE SHOULDER ARTHROPLASTY;  Surgeon: Yolonda Kida, MD;  Location: [facility] ORS;  Service: Orthopedics;  Laterality: Right;  120   TONGUE SURGERY     robotic assisted with bilateral neck dissection 11/2011   TOTAL KNEE ARTHROPLASTY Right    partial   TOTAL KNEE REVISION Right 09/12/2015   Procedure: RIGHT TOTAL KNEE REVISION;  Surgeon: Durene Romans, MD;  Location: [facility] ORS;  Service: Orthopedics;  Laterality: Right;    Family History  Problem Relation Age of Onset   Hypertension Mother    Dementia Mother    Leukemia Father    Headache Father    Other Father        liver aneurysm   Colon cancer Maternal Grandmother 78   Esophageal cancer Neg Hx    Pancreatic cancer Neg Hx    Stomach cancer Neg Hx    Liver disease Neg Hx     Social History   Socioeconomic History   Marital status: Married    Spouse name: Not on file   Number of children: 2   Years of education: Not on file   Highest education level: Not on file  Occupational History   Occupation: Retired  Tobacco Use   Smoking status: Former    Packs/day: 1.00    Years: 9.00    Additional pack years: 0.00    Total pack years: 9.00    Types: Cigarettes    Quit date: 10/15/1980    Years since quitting: 42.3   Smokeless tobacco: Former    Types: Chew    Quit date: 10/16/2007   Vaping Use   Vaping Use: Never used  Substance and Sexual Activity   Alcohol use: Not Currently    Alcohol/week: 43.0 standard drinks of alcohol    Types:  1 Cans of beer, 42 Standard drinks or equivalent per week    Comment: 2 xper week   Drug use: No   Sexual activity: Not Currently  Other Topics Concern   Not on file  Social History Narrative   Not on file   Social Determinants of Health   Financial Resource Strain: Not on file  Food Insecurity: Not on file  Transportation Needs: Not on file  Physical Activity: Not on file  Stress: Not on file  Social Connections: Not on file  Intimate Partner Violence: Not on file    ROS      Objective    BP 134/68   Pulse 70   Ht 6\' 2"  (1.88 m)   Wt 259 lb (117.5 kg)   SpO2 98%   BMI 33.25 kg/m   Physical Exam Constitutional:      Appearance: He is well-developed.  HENT:     Head: Normocephalic and atraumatic.  Cardiovascular:     Rate and Rhythm: Normal rate and regular rhythm.     Heart sounds: Normal heart sounds.  Pulmonary:     Effort: Pulmonary effort is normal.     Breath sounds: Normal breath sounds.  Skin:    General: Skin is warm and dry.  Neurological:     Mental Status: He is alert and oriented to person, place, and time.  Psychiatric:        Behavior: Behavior normal.         Assessment & Plan:   Problem List Items Addressed This Visit       Cardiovascular and Mediastinum   Primary hypertension    Pressure well-controlled.  Follow-up in the fall.        Respiratory   Nodule of upper lobe of left lung    Followed at the [place].  He had a CT thorax on January 30, 2023, showing stable 4 mm left upper lobe pulmonary nodule.        Digestive   GERD (gastroesophageal reflux disease)    Discussed decreasing PPI down to once a day and maybe even every other day.  He says in fact recently he traveled for vacation and missed his medication for most a week and actually had no symptoms.         Other   Unspecified macular degeneration    Followed every 6 months at the [place].      RLS (restless legs syndrome)   Insomnia    On trazodone, since ambien caused sleep walking.        Hyperlipidemia - Primary    Prior history of mild coronary artery disease and atherosclerosis he is on a statin.      Gout    No recent flares or exacerbations.      Anxiety    Currently on sertraline 50 mg he has been on it for 3 years and feels like he is doing well with it.       Encouraged him to check to see if he has had the Prevnar 20 done at the [place] if not we are happy to do it here or he can get it done there.  Return in about 4 months (around 06/29/2023).   Nani Gasser, MD

## 2023-02-26 NOTE — Assessment & Plan Note (Signed)
Pressure well-controlled.  Follow-up in the fall.

## 2023-07-01 ENCOUNTER — Encounter: Payer: Self-pay | Admitting: Family Medicine

## 2023-07-01 ENCOUNTER — Ambulatory Visit (INDEPENDENT_AMBULATORY_CARE_PROVIDER_SITE_OTHER): Payer: Medicare Other | Admitting: Family Medicine

## 2023-07-01 VITALS — BP 131/62 | HR 80 | Ht 74.0 in | Wt 258.0 lb

## 2023-07-01 DIAGNOSIS — M1A0720 Idiopathic chronic gout, left ankle and foot, without tophus (tophi): Secondary | ICD-10-CM

## 2023-07-01 DIAGNOSIS — Z125 Encounter for screening for malignant neoplasm of prostate: Secondary | ICD-10-CM | POA: Diagnosis not present

## 2023-07-01 DIAGNOSIS — E7800 Pure hypercholesterolemia, unspecified: Secondary | ICD-10-CM

## 2023-07-01 DIAGNOSIS — Z0000 Encounter for general adult medical examination without abnormal findings: Secondary | ICD-10-CM

## 2023-07-01 DIAGNOSIS — I10 Essential (primary) hypertension: Secondary | ICD-10-CM | POA: Diagnosis not present

## 2023-07-01 DIAGNOSIS — Z23 Encounter for immunization: Secondary | ICD-10-CM | POA: Diagnosis not present

## 2023-07-01 DIAGNOSIS — C01 Malignant neoplasm of base of tongue: Secondary | ICD-10-CM

## 2023-07-01 NOTE — Progress Notes (Signed)
Complete physical exam  Patient: Allen Robertson   DOB: Nov 15, 1950   72 y.o. Male  MRN: [id number]  Subjective:    Chief Complaint  Patient presents with   Annual Exam    Allen Robertson is a 72 y.o. male who presents today for a complete physical exam. He reports consuming a general diet.  Walking 3 miles/day and golf 2 x per day.    He generally feels well. He does not have additional problems to discuss today.    Most recent fall risk assessment:    02/26/2023   10:26 AM  Fall Risk   Falls in the past year? 1  Number falls in past yr: 1  Injury with Fall? 0  Risk for fall due to : History of fall(s)  Follow up Falls evaluation completed     Most recent depression screenings:    07/01/2023   10:18 AM 02/26/2023   10:27 AM  PHQ 2/9 Scores  PHQ - 2 Score 0 0        Patient Care Team: Agapito Games, MD as PCP - General (Family Medicine) Rollene Rotunda, MD as Consulting Physician (Cardiology)   Outpatient Medications Prior to Visit  Medication Sig   acetaminophen (TYLENOL) 500 MG tablet Take 1,000 mg by mouth every 6 (six) hours as needed for mild pain.   amLODipine (NORVASC) 5 MG tablet Take 5 mg by mouth daily.   atorvastatin (LIPITOR) 10 MG tablet Take 10 mg by mouth daily at 6 PM.    celecoxib (CELEBREX) 100 MG capsule 1 to 2 capsules daily. (Patient taking differently: Take 100 mg by mouth daily as needed for mild pain or moderate pain.)   colchicine 0.6 MG tablet Take 0.6 mg by mouth daily as needed (gout).   Cyanocobalamin (VITAMIN B-12) 3000 MCG SUBL Place 3,000 mcg under the tongue daily.   Multiple Vitamins-Minerals (ICAPS AREDS 2 PO) Take 1 tablet by mouth 2 (two) times daily.   pantoprazole (PROTONIX) 40 MG tablet Take 1 tablet (40 mg total) by mouth 2 (two) times daily.   sertraline (ZOLOFT) 25 MG tablet Take 25 mg by mouth every morning.   sodium chloride (OCEAN) 0.65 % SOLN nasal spray Place 1 spray into both nostrils as needed for  congestion.   SUMAtriptan (IMITREX) 50 MG tablet Take 50 mg by mouth every 2 (two) hours as needed for migraine. May repeat in 2 hours if headache persists or recurs.   traZODone (DESYREL) 100 MG tablet Take 100 mg by mouth at bedtime as needed for sleep.   No facility-administered medications prior to visit.    ROS        Objective:     BP 131/62   Pulse 80   Ht 6\' 2"  (1.88 m)   Wt 258 lb (117 kg)   SpO2 97%   BMI 33.13 kg/m    Physical Exam Constitutional:      Appearance: Normal appearance.  HENT:     Head: Normocephalic and atraumatic.     Right Ear: Tympanic membrane, ear canal and external ear normal.     Left Ear: Tympanic membrane, ear canal and external ear normal.     Nose: Nose normal.     Mouth/Throat:     Pharynx: Oropharynx is clear.  Eyes:     Extraocular Movements: Extraocular movements intact.     Conjunctiva/sclera: Conjunctivae normal.     Pupils: Pupils are equal, round, and reactive to light.  Neck:  Thyroid: No thyromegaly.  Cardiovascular:     Rate and Rhythm: Normal rate and regular rhythm.  Pulmonary:     Effort: Pulmonary effort is normal.     Breath sounds: Normal breath sounds.  Abdominal:     General: Bowel sounds are normal.     Palpations: Abdomen is soft.     Tenderness: There is no abdominal tenderness.  Musculoskeletal:        General: No swelling.     Cervical back: Neck supple.  Skin:    General: Skin is warm and dry.  Neurological:     Mental Status: He is oriented to person, place, and time.  Psychiatric:        Mood and Affect: Mood normal.        Behavior: Behavior normal.      No results found for any visits on 07/01/23.     Assessment & Plan:    Routine Health Maintenance and Physical Exam  Immunization History  Administered Date(s) Administered   Influenza Split 07/15/2018   Influenza, High Dose Seasonal PF 08/02/2017, 07/19/2022   Influenza,inj,Quad PF,6+ Mos 08/26/2015, 08/08/2016    Influenza,inj,quad, With Preservative 07/19/2022   Influenza,trivalent, recombinat, inj, PF 07/15/2018   Influenza-Unspecified 09/24/2002, 10/10/2005, 07/30/2006, 06/30/2008, 07/29/2009, 08/03/2010, 08/21/2011, 06/30/2012, 07/07/2013, 07/21/2014, 07/14/2015, 07/16/2019, 06/15/2020, 06/28/2021   PFIZER(Purple Top)SARS-COV-2 Vaccination 05/24/2020, 06/14/2020   Pfizer Covid-19 Vaccine Bivalent Booster 38yrs & up 07/03/2021   Pneumococcal Conjugate-13 06/18/2017   Pneumococcal Polysaccharide-23 06/19/2018   Tdap 01/13/2009, 07/07/2014   Zoster Recombinant(Shingrix) 08/09/2017, 10/24/2017   Zoster, Live 06/12/2012, 01/06/2013    Health Maintenance  Topic Date Due   Medicare Annual Wellness (AWV)  Never done   Hepatitis C Screening  Never done   INFLUENZA VACCINE  05/16/2023   COVID-19 Vaccine (4 - 2023-24 season) 06/16/2023   DTaP/Tdap/Td (3 - Td or Tdap) 07/07/2024   Colonoscopy  08/08/2027   Pneumonia Vaccine 41+ Years old  Completed   Zoster Vaccines- Shingrix  Completed   HPV VACCINES  Aged Out    Discussed health benefits of physical activity, and encouraged him to engage in regular exercise appropriate for his age and condition.  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  Your vaccines are up to date.  Get some updated labs today as well.  Return in about 6 months (around 12/29/2023) for Hypertension.     Nani Gasser, MD

## 2023-07-02 LAB — CMP14+EGFR
ALT: 20 IU/L (ref 0–44)
AST: 26 IU/L (ref 0–40)
Albumin: 4.3 g/dL (ref 3.8–4.8)
Alkaline Phosphatase: 106 IU/L (ref 44–121)
BUN/Creatinine Ratio: 19 (ref 10–24)
BUN: 14 mg/dL (ref 8–27)
Bilirubin Total: 0.6 mg/dL (ref 0.0–1.2)
CO2: 22 mmol/L (ref 20–29)
Calcium: 9.9 mg/dL (ref 8.6–10.2)
Chloride: 102 mmol/L (ref 96–106)
Creatinine, Ser: 0.75 mg/dL — ABNORMAL LOW (ref 0.76–1.27)
Globulin, Total: 2.9 g/dL (ref 1.5–4.5)
Glucose: 90 mg/dL (ref 70–99)
Potassium: 4.2 mmol/L (ref 3.5–5.2)
Sodium: 140 mmol/L (ref 134–144)
Total Protein: 7.2 g/dL (ref 6.0–8.5)
eGFR: 96 mL/min/{1.73_m2} (ref >59)

## 2023-07-02 LAB — LIPID PANEL WITH LDL/HDL RATIO
Cholesterol, Total: 159 mg/dL (ref 100–199)
HDL: 70 mg/dL (ref >39)
LDL Chol Calc (NIH): 67 mg/dL (ref 0–99)
LDL/HDL Ratio: 1 ratio (ref 0.0–3.6)
Triglycerides: 131 mg/dL (ref 0–149)
VLDL Cholesterol Cal: 22 mg/dL (ref 5–40)

## 2023-07-02 LAB — PSA: Prostate Specific Ag, Serum: 1 ng/mL (ref 0.0–4.0)

## 2023-07-02 LAB — URIC ACID: Uric Acid: 5.2 mg/dL (ref 3.8–8.4)

## 2023-07-02 NOTE — Progress Notes (Signed)
Uric acid level looks great.  It is under 6 which is where we want it.

## 2023-07-02 NOTE — Progress Notes (Signed)
Hi Judy, kidney function is stable.  Liver enzymes are normal.  Cholesterol looks great and PSA test is normal.  Nothing still pending is the uric acid level.

## 2023-08-22 ENCOUNTER — Ambulatory Visit (INDEPENDENT_AMBULATORY_CARE_PROVIDER_SITE_OTHER): Payer: Medicare Other | Admitting: Sports Medicine

## 2023-08-22 ENCOUNTER — Other Ambulatory Visit (INDEPENDENT_AMBULATORY_CARE_PROVIDER_SITE_OTHER): Payer: Medicare Other

## 2023-08-22 ENCOUNTER — Encounter: Payer: Self-pay | Admitting: Sports Medicine

## 2023-08-22 DIAGNOSIS — M1612 Unilateral primary osteoarthritis, left hip: Secondary | ICD-10-CM

## 2023-08-22 MED ORDER — TRIAMCINOLONE ACETONIDE 40 MG/ML IJ SUSP
40.0000 mg | Freq: Once | INTRAMUSCULAR | Status: AC
  Administered 2023-08-22: 40 mg via INTRAMUSCULAR

## 2023-08-22 NOTE — Assessment & Plan Note (Signed)
Very pleasant 72 year old male, hip osteoarthritis, last injected January this year, recurrence of pain, repeat injection.

## 2023-08-22 NOTE — Progress Notes (Signed)
    Procedures performed today:    Procedure: Real-time Ultrasound Guided injection of the left hip Device: Samsung HS60  Verbal informed consent obtained.  Time-out conducted.  Noted no overlying erythema, induration, or other signs of local infection.  Skin prepped in a sterile fashion.  Local anesthesia: Topical Ethyl chloride.  With sterile technique and under real time ultrasound guidance: Arthritic joint noted, 1 cc Kenalog 40, 2 cc lidocaine, 2 cc bupivacaine injected easily Completed without difficulty  Advised to call if fevers/chills, erythema, induration, drainage, or persistent bleeding.  Images permanently stored and available for review in PACS.  Impression: Technically successful ultrasound guided injection.  Independent interpretation of notes and tests performed by another provider:   None.  Brief History, Exam, Impression, and Recommendations:    Primary osteoarthritis of left hip Very pleasant 72 year old male, hip osteoarthritis, last injected January this year, recurrence of pain, repeat injection.    ____________________________________________ Allen Robertson. Benjamin Stain, M.D., ABFM., CAQSM., AME. 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

## 2023-08-22 NOTE — Addendum Note (Signed)
Addended by: Carren Rang A on: 08/22/2023 02:33 PM   Modules accepted: Orders

## 2024-01-07 ENCOUNTER — Ambulatory Visit: Payer: Medicare Other | Admitting: Family Medicine

## 2024-02-03 ENCOUNTER — Encounter: Payer: Self-pay | Admitting: Family Medicine

## 2024-02-03 ENCOUNTER — Ambulatory Visit (INDEPENDENT_AMBULATORY_CARE_PROVIDER_SITE_OTHER): Admitting: Family Medicine

## 2024-02-03 VITALS — BP 141/80 | HR 91 | Ht 74.0 in | Wt 261.0 lb

## 2024-02-03 DIAGNOSIS — M79672 Pain in left foot: Secondary | ICD-10-CM

## 2024-02-03 DIAGNOSIS — I10 Essential (primary) hypertension: Secondary | ICD-10-CM | POA: Diagnosis not present

## 2024-02-03 DIAGNOSIS — R7309 Other abnormal glucose: Secondary | ICD-10-CM | POA: Diagnosis not present

## 2024-02-03 LAB — POCT GLYCOSYLATED HEMOGLOBIN (HGB A1C): Hemoglobin A1C: 4.9 % (ref 4.0–5.6)

## 2024-02-03 NOTE — Assessment & Plan Note (Signed)
 Show blood pressure elevated today we will recheck.  Normally looks much better.  Will have him check it at home if it still little elevated this week and then let me know if we might need to adjust his medication regimen.

## 2024-02-03 NOTE — Patient Instructions (Signed)
 Check at the [facility] in May to see if you are due for an updated Tdap as well as Prevnar 20

## 2024-02-03 NOTE — Progress Notes (Addendum)
 Established Patient Office Visit  Subjective  Patient ID: Allen Robertson, male    DOB: 23-Sep-1951  Age: 73 y.o. MRN: [id number]  Chief Complaint  Patient presents with   Hypertension    HPI  Hypertension- Pt denies chest pain, SOB, dizziness, or heart palpitations.  Taking meds as directed w/o problems.  Denies medication side effects.  Has been walking some in fact this morning he walked 3 miles.  Had have a more heavy salt meal yesterday with it being Easter.  F/U Gout -  no recent flares.   He has been having some foot pain on the top of his left foot on and off.  No injury or trauma he wonders if it could be a tendinitis.  He mostly wears boots shoes during the day but does wear sneakers when he walks for exercise.  He unfortunately had to deal with a kidney stone recently and had to have surgery for it.   ROS    Objective:     BP (!) 141/80   Pulse 91   Ht 6\' 2"  (1.88 m)   Wt 261 lb (118.4 kg)   SpO2 98%   BMI 33.51 kg/m    Physical Exam Vitals and nursing note reviewed.  Constitutional:      Appearance: Normal appearance.  HENT:     Head: Normocephalic and atraumatic.  Eyes:     Conjunctiva/sclera: Conjunctivae normal.  Cardiovascular:     Rate and Rhythm: Normal rate and regular rhythm.  Pulmonary:     Effort: Pulmonary effort is normal.     Breath sounds: Normal breath sounds.  Musculoskeletal:     Comments: Foot with normal appearance.  No significant swelling or rash.  Nontender along the metatarsals.  No pain with dorsiflexion against resistance  Skin:    General: Skin is warm and dry.  Neurological:     Mental Status: He is alert.  Psychiatric:        Mood and Affect: Mood normal.      Results for orders placed or performed in visit on 02/03/24  POCT HgB A1C  Result Value Ref Range   Hemoglobin A1C 4.9 4.0 - 5.6 %   HbA1c POC (<> result, manual entry)     HbA1c, POC (prediabetic range)     HbA1c, POC (controlled diabetic range)         The 10-year ASCVD risk score (Arnett DK, et al., 2019) is: 20.5%    Assessment & Plan:   Problem List Items Addressed This Visit       Cardiovascular and Mediastinum   Primary hypertension - Primary   Show blood pressure elevated today we will recheck.  Normally looks much better.  Will have him check it at home if it still little elevated this week and then let me know if we might need to adjust his medication regimen.      Relevant Medications   atorvastatin  (LIPITOR) 10 MG tablet   Other Visit Diagnoses       Abnormal glucose       Relevant Orders   POCT HgB A1C (Completed)     Left foot pain           Left foot pain-mostly on the top of the foot closer to the 1st and 2nd metatarsal proximal head but occasionally radiates down to the great toe.  We discussed wearing more supportive inserts or shoe wear.  He has some inserts so he will try wearing those in  his regular shoes and then if not improving we can refer him to Dr. Noralyn Beams for further workup.  So encouraged him to get his Tdap and Prevnar 20 updated at the [place] next month.  They can confirm if he is already had it or not.  Abnormal glucose-A1c looks fantastic today at 4.9.  Return in about 6 months (around 08/04/2024) for Wellness Exam.    Duaine German, MD

## 2024-06-16 ENCOUNTER — Encounter: Payer: Self-pay | Admitting: Sports Medicine

## 2024-07-01 ENCOUNTER — Encounter: Payer: Self-pay | Admitting: Family Medicine

## 2024-07-01 ENCOUNTER — Encounter: Admitting: Family Medicine

## 2024-08-04 ENCOUNTER — Ambulatory Visit (INDEPENDENT_AMBULATORY_CARE_PROVIDER_SITE_OTHER)

## 2024-08-04 VITALS — BP 130/73 | HR 75 | Ht 74.0 in | Wt 255.0 lb

## 2024-08-04 DIAGNOSIS — Z0000 Encounter for general adult medical examination without abnormal findings: Secondary | ICD-10-CM

## 2024-08-04 NOTE — Patient Instructions (Signed)
  Mr. Allen Robertson , Thank you for taking time to come for your Medicare Wellness Visit. I appreciate your ongoing commitment to your health goals. Please review the following plan we discussed and let me know if I can assist you in the future.   These are the goals we discussed:  Goals      Patient Stated     Patient states he would like to lose weight.         This is a list of the screening recommended for you and due dates:  Health Maintenance  Topic Date Due   Hepatitis C Screening  Never done   Flu Shot  05/15/2024   COVID-19 Vaccine (4 - 2025-26 season) 06/15/2024   Medicare Annual Wellness Visit  08/04/2025   Colon Cancer Screening  08/08/2027   DTaP/Tdap/Td vaccine (4 - Td or Tdap) 02/25/2034   Pneumococcal Vaccine for age over 86  Completed   Zoster (Shingles) Vaccine  Completed   Meningitis B Vaccine  Aged Out

## 2024-08-04 NOTE — Progress Notes (Signed)
 Subjective:   EDEM TIEGS is a 73 y.o. male who presents for Medicare Annual/Subsequent preventive examination.  Visit Complete: In person  Patient Medicare AWV questionnaire was completed by the patient on 07/31/2024; I have confirmed that all information answered by patient is correct and no changes since this date.  Cardiac Risk Factors include: advanced age (>67men, >1 women);male gender;dyslipidemia;hypertension;obesity (BMI >30kg/m2);smoking/ tobacco exposure     Objective:    Today's Vitals   08/04/24 0804 08/04/24 0805 08/04/24 0826  BP: (!) 143/85  130/73  Pulse: 75    SpO2: 99%    Weight: 255 lb (115.7 kg)    Height: 6' 2 (1.88 m)    PainSc:  4     Body mass index is 32.74 kg/m.     08/04/2024    8:13 AM 11/13/2022    1:25 PM 06/09/2019    7:34 AM 06/05/2019   10:29 AM 02/27/2019    9:20 AM 02/22/2019    5:57 PM 07/15/2017   10:58 AM  Advanced Directives  Does Patient Have a Medical Advance Directive? Yes Yes Yes Yes  Yes Yes   Type of Estate agent of [place];Living will Living will;Healthcare Power of [employer] Power of [place];Living will Healthcare Power of [place];Living will Healthcare Power of [place];Living will Healthcare Power of [place];Living will Healthcare Power of [place];Living will  Does patient want to make changes to medical advance directive? No - Patient declined No - Patient declined No - Patient declined No - Patient declined  No - Patient declined     Copy of Healthcare Power of Attorney in Chart?  No - copy requested No - copy requested No - copy requested  No - copy requested  No - copy requested    Would patient like information on creating a medical advance directive?      No - Patient declined       Data saved with a previous flowsheet row definition    Current Medications (verified) Outpatient Encounter Medications as of 08/04/2024  Medication Sig   acetaminophen  (TYLENOL ) 500 MG tablet  Take 1,000 mg by mouth every 6 (six) hours as needed for mild pain.   amLODipine (NORVASC) 5 MG tablet Take 5 mg by mouth daily.   atorvastatin  (LIPITOR) 10 MG tablet Take 10 mg by mouth at bedtime.   celecoxib  (CELEBREX ) 100 MG capsule 1 to 2 capsules daily.   colchicine 0.6 MG tablet Take 0.6 mg by mouth daily as needed (gout).   Cyanocobalamin (VITAMIN B-12) 3000 MCG SUBL Place 3,000 mcg under the tongue daily.   Multiple Vitamins-Minerals (ICAPS AREDS 2 PO) Take 1 tablet by mouth 2 (two) times daily.   pantoprazole  (PROTONIX ) 40 MG tablet Take 1 tablet (40 mg total) by mouth 2 (two) times daily.   sertraline (ZOLOFT) 25 MG tablet Take 25 mg by mouth every morning.   sodium chloride  (OCEAN) 0.65 % SOLN nasal spray Place 1 spray into both nostrils as needed for congestion.   SUMAtriptan (IMITREX) 50 MG tablet Take 50 mg by mouth every 2 (two) hours as needed for migraine. May repeat in 2 hours if headache persists or recurs.   tamsulosin (FLOMAX) 0.4 MG CAPS capsule Take 0.4 mg by mouth.   traZODone (DESYREL) 100 MG tablet Take 100 mg by mouth at bedtime as needed for sleep.   No facility-administered encounter medications on file as of 08/04/2024.    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   Cataract 2022   No surgery   Fracture of left clavicle    2000 / also had infection had to have IV infusion therapy    GERD (gastroesophageal reflux disease)    Headache    prescribed metoprolol  for HA management   History of chicken pox    History of kidney stones    Hyperlipidemia    Hypertension    Recurrent falls    Staph skin infection    Tinnitus    Varicose veins    Past Surgical History:  Procedure Laterality Date   ablation calf     2009   BRISTOW  PROCEDURE     left shoulder    CHOLECYSTECTOMY N/A 06/09/2019   Procedure: LAPAROSCOPIC CHOLECYSTECTOMY;  Surgeon: Vanderbilt Ned, MD;  Location: [facility] OR;  Service: General;  Laterality: N/A;   I & D EXTREMITY     right lower leg    JOINT REPLACEMENT     LEFT HEART CATH AND CORONARY ANGIOGRAPHY N/A 02/27/2019   Procedure: LEFT HEART CATH AND CORONARY ANGIOGRAPHY;  Surgeon: Burnard Ned LABOR, MD;  Location: [facility] INVASIVE CV [facility];  Service: Cardiovascular;  Laterality: N/A;   LEG SURGERY Right    LITHOTRIPSY     NASAL TURBINATE REDUCTION Bilateral    with deviated septum repair   PICC LINE PLACE PERIPHERAL ([facility] HX)     history of / secondary to right lower leg infection    REVERSE SHOULDER ARTHROPLASTY Right 11/23/2022   Procedure: REVERSE SHOULDER ARTHROPLASTY;  Surgeon: Sharl Selinda Dover, MD;  Location: [facility] ORS;  Service: Orthopedics;  Laterality: Right;  120   TONGUE SURGERY     robotic assisted with bilateral neck dissection 11/2011   TOTAL KNEE ARTHROPLASTY Right    partial   TOTAL KNEE REVISION Right 09/12/2015   Procedure: RIGHT TOTAL KNEE REVISION;  Surgeon: Donnice Car, MD;  Location: [facility];  Service: Orthopedics;  Laterality: Right;   Family History  Problem Relation Age of Onset   Hypertension Mother    Dementia Mother    Miscarriages / [place] Mother    Stroke Mother    Leukemia Father    Headache Father    Other Father        liver aneurysm   Arthritis Father    Cancer Father    Colon cancer Maternal Grandmother 40   Esophageal cancer Neg Hx    Pancreatic cancer Neg Hx    Stomach cancer Neg Hx    Liver disease Neg Hx    Social History   Socioeconomic History   Marital status: Married    Spouse name: Not on file   Number of children: 2   Years of education: Not on file   Highest education level: 12th grade  Occupational History   Occupation: Retired  Tobacco Use   Smoking status: Former    Current packs/day: 0.00    Average packs/day: 1 pack/day for  10.0 years (10.0 ttl pk-yrs)    Types: Cigarettes    Start date: 10/16/1971    Quit date: 10/15/1980    Years since quitting: 43.8   Smokeless tobacco: Former    Types: Chew    Quit date: 10/16/2007  Vaping Use   Vaping status: Never Used  Substance and Sexual Activity  Alcohol use: Yes    Alcohol/week: 14.0 standard drinks of alcohol    Types: 12 Cans of beer, 2 Shots of liquor per week    Comment: 2 xper week   Drug use: No   Sexual activity: Yes    Birth control/protection: None  Other Topics Concern   Not on file  Social History Narrative   Larnie lives with his wife. He enjoys playing golf.    Social Drivers of Corporate investment banker Strain: Low Risk  (08/04/2024)   Overall Financial Resource Strain (CARDIA)    Difficulty of Paying Living Expenses: Not hard at all  Food Insecurity: No Food Insecurity (08/04/2024)   Hunger Vital Sign    Worried About Running Out of Food in the Last Year: Never true    Ran Out of Food in the Last Year: Never true  Transportation Needs: No Transportation Needs (08/04/2024)   PRAPARE - Administrator, Civil Service (Medical): No    Lack of Transportation (Non-Medical): No  Physical Activity: Sufficiently Active (08/04/2024)   Exercise Vital Sign    Days of Exercise per Week: 5 days    Minutes of Exercise per Session: 60 min  Recent Concern: Physical Activity - Inactive (07/31/2024)   Exercise Vital Sign    Days of Exercise per Week: 0 days    Minutes of Exercise per Session: Not on file  Stress: No Stress Concern Present (08/04/2024)   [employer] of Occupational Health - Occupational Stress Questionnaire    Feeling of Stress: Only a little  Recent Concern: Stress - Stress Concern Present (06/29/2024)   [employer] of Occupational Health - Occupational Stress Questionnaire    Feeling of Stress: To some extent  Social Connections: Socially Integrated (08/04/2024)   Social Connection and Isolation Panel     Frequency of Communication with Friends and Family: More than three times a week    Frequency of Social Gatherings with Friends and Family: Once a week    Attends Religious Services: More than 4 times per year    Active Member of [employer] or Organizations: Yes    Attends Engineer, structural: More than 4 times per year    Marital Status: Married    Tobacco Counseling Counseling given: Not Answered   Clinical Intake:  Pre-visit preparation completed: Yes  Pain : 0-10 Pain Score: 4  Pain Type: Chronic pain Pain Location: Knee Pain Orientation: Left Pain Onset: More than a month ago Pain Frequency: Intermittent     BMI - recorded: 32.74 Nutritional Status: BMI > 30  Obese Nutritional Risks: None Diabetes: No  How often do you need to have someone help you when you read instructions, pamphlets, or other written materials from your doctor or pharmacy?: 1 - Never What is the last grade level you completed in school?: 12  Interpreter Needed?: No      Activities of Daily Living    08/04/2024    8:06 AM 07/31/2024   10:16 AM  In your present state of health, do you have any difficulty performing the following activities:  Hearing? 0 0  Vision? 0 0  Difficulty concentrating or making decisions? 0 0  Walking or climbing stairs? 0 0  Dressing or bathing? 0 0  Doing errands, shopping? 0 0  Preparing Food and eating ? N N  Using the Toilet? N N  In the past six months, have you accidently leaked urine? N N  Do you have problems with loss  of bowel control? N N  Managing your Medications? N N  Managing your Finances? N N  Housekeeping or managing your Housekeeping? N N    Patient Care Team: Alvan Dorothyann BIRCH, MD as PCP - General (Family Medicine)  Indicate any recent Medical Services you may have received from other than [facility] providers in the past year (date may be approximate).     Assessment:   This is a routine wellness examination for  [place].  Hearing/Vision screen No results found.   Goals Addressed             This Visit's Progress    Patient Stated       Patient states he would like to lose weight.        Depression Screen    08/04/2024    8:12 AM 07/01/2023   10:18 AM 02/26/2023   10:27 AM  PHQ 2/9 Scores  PHQ - 2 Score 0 0 0    Fall Risk    08/04/2024    8:14 AM 07/31/2024   10:16 AM 02/26/2023   10:26 AM  Fall Risk   Falls in the past year? 0 0 1  Number falls in past yr: 0 0 1  Injury with Fall? 0  0  Risk for fall due to : No Fall Risks  History of fall(s)  Follow up Falls evaluation completed  Falls evaluation completed    MEDICARE RISK AT HOME: Medicare Risk at Home Any stairs in or around the home?: Yes If so, are there any without handrails?: Yes Home free of loose throw rugs in walkways, pet beds, electrical cords, etc?: Yes Adequate lighting in your home to reduce risk of falls?: Yes Life alert?: No Use of a cane, walker or w/c?: No Grab bars in the bathroom?: No Shower chair or bench in shower?: Yes Elevated toilet seat or a handicapped toilet?: Yes  TIMED UP AND GO:  Was the test performed?  Yes  Length of time to ambulate 10 feet: 8 sec Gait steady and fast without use of assistive device    Cognitive Function:        08/04/2024    8:14 AM  6CIT Screen  What Year? 0 points  What month? 0 points  What time? 0 points  Count back from 20 0 points  Months in reverse 2 points  Repeat phrase 0 points  Total Score 2 points    Immunizations Immunization History  Administered Date(s) Administered   Fluad Trivalent(High Dose 65+) 07/01/2023   Fluzone Influenza virus vaccine,trivalent (IIV3), split virus 06/17/2017   INFLUENZA, HIGH DOSE SEASONAL PF 08/02/2017, 07/19/2022, 07/16/2023   Influenza Split 07/15/2018   Influenza,inj,Quad PF,6+ Mos 08/26/2015, 08/08/2016   Influenza,inj,quad, With Preservative 07/19/2022   Influenza,trivalent, recombinat, inj, PF  07/15/2018   Influenza-Unspecified 09/24/2002, 10/10/2005, 07/30/2006, 06/30/2008, 07/29/2009, 08/03/2010, 08/21/2011, 06/30/2012, 07/07/2013, 07/21/2014, 07/14/2015, 07/16/2019, 06/15/2020, 06/28/2021   PFIZER(Purple Top)SARS-COV-2 Vaccination 05/24/2020, 06/14/2020   Pfizer Covid-19 Vaccine Bivalent Booster 78yrs & up 07/03/2021   Pneumococcal Conjugate-13 06/18/2017   Pneumococcal Polysaccharide-23 06/19/2018   Tdap 01/13/2009, 07/07/2014, 02/26/2024   Zoster Recombinant(Shingrix) 08/09/2017, 10/24/2017   Zoster, Live 06/12/2012, 01/06/2013    TDAP status: Up to date  Flu Vaccine status: Due, Education has been provided regarding the importance of this vaccine. Advised may receive this vaccine at local pharmacy or Health Dept. Aware to provide a copy of the vaccination record if obtained from local pharmacy or Health Dept. Verbalized acceptance and understanding.  Pneumococcal vaccine status:  Up to date  Covid-19 vaccine status: Declined, Education has been provided regarding the importance of this vaccine but patient still declined. Advised may receive this vaccine at local pharmacy or Health Dept.or vaccine clinic. Aware to provide a copy of the vaccination record if obtained from local pharmacy or Health Dept. Verbalized acceptance and understanding.  Qualifies for Shingles Vaccine? Yes   Zostavax completed No   Shingrix Completed?: Yes  Screening Tests Health Maintenance  Topic Date Due   Hepatitis C Screening  Never done   Influenza Vaccine  05/15/2024   COVID-19 Vaccine (4 - 2025-26 season) 06/15/2024   Medicare Annual Wellness (AWV)  08/04/2025   Colonoscopy  08/08/2027   DTaP/Tdap/Td (4 - Td or Tdap) 02/25/2034   Pneumococcal Vaccine: 50+ Years  Completed   Zoster Vaccines- Shingrix  Completed   Meningococcal B Vaccine  Aged Out    Health Maintenance  Health Maintenance Due  Topic Date Due   Hepatitis C Screening  Never done   Influenza Vaccine  05/15/2024    COVID-19 Vaccine (4 - 2025-26 season) 06/15/2024    Colorectal cancer screening: Type of screening: Colonoscopy. Completed 08/07/2017. Repeat every 10 years  Lung Cancer Screening: (Low Dose CT Chest recommended if Age 74-80 years, 20 pack-year currently smoking OR have quit w/in 15years.) does not qualify.   Lung Cancer Screening Referral: n/a  Additional Screening:  Hepatitis C Screening: does qualify; Completed Not yet  Vision Screening: Recommended annual ophthalmology exams for early detection of glaucoma and other disorders of the eye. Is the patient up to date with their annual eye exam?  Yes  Who is the provider or what is the name of the office in which the patient attends [facility] If pt is not established with a provider, would they like to be referred to a provider to establish care? N/a.   Dental Screening: Recommended annual dental exams for proper oral hygiene   Community Resource Referral / Chronic Care Management: CRR required this visit?  No   CCM required this visit?  No     Plan:     I have personally reviewed and noted the following in the patient's chart:   Medical and social history Use of alcohol, tobacco or illicit drugs  Current medications and supplements including opioid prescriptions. Patient is not currently taking opioid prescriptions. Functional ability and status Nutritional status Physical activity Advanced directives List of other physicians Hospitalizations# 0, surgeries # 0, and ER # 2 visits in previous 12 months. Vitals Screenings to include cognitive, depression, and falls Referrals and appointments  In addition, I have reviewed and discussed with patient certain preventive protocols, quality metrics, and best practice recommendations. A written personalized care plan for preventive services as well as general preventive health recommendations were provided to patient.     Bonny Jon Mayor, CMA   08/04/2024    After Visit Summary: (In Person-Declined) Patient declined AVS at this time.  Nurse Notes:   IBRAHEM VOLKMAN is a 73 y.o. male patient of Dorothyann Byars, MD who had a Medicare Annual Wellness Visit today. Atari lives with his wife. He reports that he is socially active and does interact with friends/family regularly. He is moderately physically active and enjoys playing golf.

## 2024-08-17 ENCOUNTER — Ambulatory Visit (INDEPENDENT_AMBULATORY_CARE_PROVIDER_SITE_OTHER): Admitting: Family Medicine

## 2024-08-17 ENCOUNTER — Encounter: Payer: Self-pay | Admitting: Family Medicine

## 2024-08-17 VITALS — BP 122/74 | HR 73 | Ht 74.0 in | Wt 257.0 lb

## 2024-08-17 DIAGNOSIS — E7800 Pure hypercholesterolemia, unspecified: Secondary | ICD-10-CM

## 2024-08-17 DIAGNOSIS — Z23 Encounter for immunization: Secondary | ICD-10-CM

## 2024-08-17 DIAGNOSIS — Z125 Encounter for screening for malignant neoplasm of prostate: Secondary | ICD-10-CM | POA: Diagnosis not present

## 2024-08-17 DIAGNOSIS — Z0000 Encounter for general adult medical examination without abnormal findings: Secondary | ICD-10-CM | POA: Diagnosis not present

## 2024-08-17 NOTE — Progress Notes (Signed)
 Complete physical exam  Patient: Allen Robertson    DOB: [birth year] 73 y.o.   MRN: [id number]  Chief Complaint  Patient presents with   Annual Exam    Subjective:    Allen Robertson is a 73 y.o. male who presents today for a complete physical exam. He reports consuming a general diet. Limited bc of knee but does yardwork and golfing He generally feels well. He reports sleeping well. He does not have additional problems to discuss today.   Discussed the use of AI scribe software for clinical note transcription with the patient, who gave verbal consent to proceed.  History of Present Illness Allen Robertson is a 73 year old male who presents for an annual physical exam and preoperative clearance for knee surgery.  Knee pain and functional limitation - Scheduled for knee surgery on January 29th - Severe knee pain limiting ability to walk - Pain began spontaneously during a walk and has persisted - Unable to walk for exercise - Able to perform upper body activities - No chest pain or breathing difficulties during exercise  Sleep quality - Sleeps well at night without issues  Psychiatric medication management - Currently taking low dose sertraline (Zoloft) - Stable on current regimen without need for changes  History of surgical procedures - Prior knee replacement - Shoulder replacement - Gallbladder removal - Shoulder surgery for recurrent dislocations, 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 0  Number falls in past yr: 0  Injury with Fall? 0  Risk for fall due to : No Fall Risks  Follow up Falls evaluation completed     Most recent depression  screenings:    08/04/2024    8:12 AM 07/01/2023   10:18 AM  PHQ 2/9 Scores  PHQ - 2 Score 0 0        Patient Care Team: Alvan Dorothyann BIRCH, MD as PCP - General (Family Medicine)   ROS    Objective:    BP 122/74   Pulse 73   Ht 6' 2 (1.88 m)   Wt 257 lb 0.6 oz (116.6 kg)   SpO2 98%   BMI 33.00 kg/m     Physical Exam Constitutional:      Appearance: Normal appearance.  HENT:     Head: Normocephalic and atraumatic.     Right Ear: Tympanic membrane, ear canal and external ear normal.     Left Ear: Tympanic membrane, ear canal and external ear normal.     Nose: Nose normal.     Mouth/Throat:     Pharynx: Oropharynx is clear.  Eyes:     Extraocular Movements: Extraocular movements intact.     Conjunctiva/sclera: Conjunctivae normal.     Pupils: Pupils are equal, round, and reactive to light.  Neck:     Thyroid : No thyromegaly.  Cardiovascular:     Rate and Rhythm: Normal rate and regular rhythm.  Pulmonary:     Effort: Pulmonary effort is normal.     Breath sounds: Normal breath sounds.  Abdominal:     General: Bowel sounds are normal.     Palpations: Abdomen is soft.  Tenderness: There is no abdominal tenderness.  Musculoskeletal:        General: No swelling.     Cervical back: Neck supple.  Skin:    General: Skin is warm and dry.  Neurological:     Mental Status: He is oriented to person, place, and time.  Psychiatric:        Mood and Affect: Mood normal.        Behavior: Behavior normal.       No results found for any visits on 08/17/24.      Assessment & Plan:    Routine Health Maintenance and Physical Exam Immunization History  Administered Date(s) Administered   Fluad Trivalent(High Dose 65+) 07/01/2023   Fluzone Influenza virus vaccine,trivalent (IIV3), split virus 06/17/2017   INFLUENZA, HIGH DOSE SEASONAL PF 08/02/2017, 07/19/2022, 07/16/2023   Influenza Split 07/15/2018   Influenza,inj,Quad PF,6+ Mos 08/26/2015, 08/08/2016    Influenza,inj,quad, With Preservative 07/19/2022   Influenza,trivalent, recombinat, inj, PF 07/15/2018   Influenza-Unspecified 09/24/2002, 10/10/2005, 07/30/2006, 06/30/2008, 07/29/2009, 08/03/2010, 08/21/2011, 06/30/2012, 07/07/2013, 07/21/2014, 07/14/2015, 07/16/2019, 06/15/2020, 06/28/2021   PFIZER(Purple Top)SARS-COV-2 Vaccination 05/24/2020, 06/14/2020   Pfizer Covid-19 Vaccine Bivalent Booster 82yrs & up 07/03/2021   Pneumococcal Conjugate-13 06/18/2017   Pneumococcal Polysaccharide-23 06/19/2018   Tdap 01/13/2009, 07/07/2014, 02/26/2024   Zoster Recombinant(Shingrix) 08/09/2017, 10/24/2017   Zoster, Live 06/12/2012, 01/06/2013    Health Maintenance  Topic Date Due   Hepatitis C Screening  Never done   COVID-19 Vaccine (4 - 2025-26 season) 06/15/2024   Influenza Vaccine  01/12/2025 (Originally 05/15/2024)   Medicare Annual Wellness (AWV)  08/04/2025   Colonoscopy  08/08/2027   DTaP/Tdap/Td (4 - Td or Tdap) 02/25/2034   Pneumococcal Vaccine: 50+ Years  Completed   Zoster Vaccines- Shingrix  Completed   Meningococcal B Vaccine  Aged Out    Discussed health benefits of physical activity, and encouraged him to engage in regular exercise appropriate for his age and condition.  Problem List Items Addressed This Visit       Other   Hyperlipidemia   Relevant Orders   Lipid Panel With LDL/HDL Ratio   Other Visit Diagnoses       Wellness examination    -  Primary     Screening for malignant neoplasm of prostate       Relevant Orders   PSA       Assessment and Plan Assessment & Plan Adult Wellness Visit Routine wellness visit conducted. Preoperative clearance for surgery on January 29th discussed. - Schedule preoperative clearance appointment for late November or early December.  Chronic right knee pain Chronic right knee pain limits physical activity. - Encourage upper body strengthening exercises using bands or weights. - Advise to remain as active as possible within  comfort limits.  Pure hypercholesterolemia Hypercholesterolemia managed with medication. Cholesterol recheck due. - Order blood work to recheck cholesterol levels.  General Health Maintenance Colonoscopy and tetanus vaccinations up to date. Flu vaccination pending. Regular dermatological checks conducted due to sun exposure history. - Administer flu shot today. - Encourage use of sunscreen and wearing a hat when outdoors. - Advise use of face lotion with sunscreen, especially when outdoors for extended periods.    Return in about 6 months (around 02/14/2025) for Medication management .    Dorothyann Byars, MD [facility] at [facility]

## 2024-08-18 ENCOUNTER — Ambulatory Visit: Payer: Self-pay | Admitting: Family Medicine

## 2024-08-18 LAB — LIPID PANEL WITH LDL/HDL RATIO
Cholesterol, Total: 149 mg/dL (ref 100–199)
HDL: 63 mg/dL (ref >39)
LDL Chol Calc (NIH): 64 mg/dL (ref 0–99)
LDL/HDL Ratio: 1 ratio (ref 0.0–3.6)
Triglycerides: 127 mg/dL (ref 0–149)
VLDL Cholesterol Cal: 22 mg/dL (ref 5–40)

## 2024-08-18 LAB — PSA: Prostate Specific Ag, Serum: 1.1 ng/mL (ref 0.0–4.0)

## 2024-08-18 NOTE — Progress Notes (Signed)
 Your lab work is within acceptable range and there are no concerning findings.   ?

## 2024-09-07 ENCOUNTER — Ambulatory Visit (INDEPENDENT_AMBULATORY_CARE_PROVIDER_SITE_OTHER): Admitting: Family Medicine

## 2024-09-07 ENCOUNTER — Encounter: Payer: Self-pay | Admitting: Family Medicine

## 2024-09-07 VITALS — BP 129/74 | HR 84 | Ht 74.0 in | Wt 259.1 lb

## 2024-09-07 DIAGNOSIS — Z01818 Encounter for other preprocedural examination: Secondary | ICD-10-CM

## 2024-09-07 NOTE — Progress Notes (Addendum)
 Established Patient Office Visit  Patient ID: Allen Robertson, male    DOB: [birth year]  Age: 73 y.o. MRN: [id number] PCP: Alvan Dorothyann BIRCH, MD  Chief Complaint  Patient presents with   Pre-op Exam    Subjective:     HPI  Discussed the use of AI scribe software for clinical note transcription with the patient, who gave verbal consent to proceed.  History of Present Illness Allen Robertson is a 73 year old male who presents for a pre-operative evaluation for upcoming surgery.  Preoperative evaluation - Preparing for surgery scheduled on January 29th, 2026 for  Left total knee replacement, spinal.  - Recent laboratory studies from November 12th at the [facility] include CBC, CMP, and A1c; A1c is 5.1, within the required timeframe for surgery  Cardiopulmonary symptoms - No recent chest pain - No shortness of breath - Able to walk 1.75 miles without significant knee pain - No history of anesthesia complications  Musculoskeletal symptoms - Experiences knee pain after walking over a mile, occurring the following day  Hematologic findings - Recurring large bruise on arm, second occurrence - Bruise appeared after minor prick while working in the yard - Bruise has been present since last Friday - Associated pruritus, managed with topical cream  Immunization status - Received influenza vaccination earlier this year - Tetanus vaccination is up to date as of 2025  Medication use - Current medication: diclofenac - Does not take fish oil or baby aspirin   Gastrointestinal symptoms - No recent swallowing issues     ROS    Objective:     BP 129/74   Pulse 84   Ht 6' 2 (1.88 m)   Wt 259 lb 1.9 oz (117.5 kg)   SpO2 97%   BMI 33.27 kg/m    Physical Exam Constitutional:      Appearance: Normal appearance.  HENT:     Head: Normocephalic and atraumatic.     Right Ear: Tympanic membrane, ear canal and external ear normal.     Left Ear: Tympanic membrane, ear canal  and external ear normal.     Nose: Nose normal.     Mouth/Throat:     Pharynx: Oropharynx is clear.  Eyes:     Extraocular Movements: Extraocular movements intact.     Conjunctiva/sclera: Conjunctivae normal.     Pupils: Pupils are equal, round, and reactive to light.  Neck:     Thyroid : No thyromegaly.  Cardiovascular:     Rate and Rhythm: Normal rate and regular rhythm.  Pulmonary:     Effort: Pulmonary effort is normal.     Breath sounds: Normal breath sounds.  Abdominal:     General: Bowel sounds are normal.     Palpations: Abdomen is soft.     Tenderness: There is no abdominal tenderness.  Musculoskeletal:        General: No swelling.     Cervical back: Neck supple.  Skin:    General: Skin is warm and dry.  Neurological:     Mental Status: He is oriented to person, place, and time.  Psychiatric:        Mood and Affect: Mood normal.        Behavior: Behavior normal.      No results found for any visits on 09/07/24.    The 10-year ASCVD risk score (Arnett DK, et al., 2019) is: 19.5%    Assessment & Plan:   Problem List Items Addressed This Visit   None Visit Diagnoses  Preop general physical exam    -  Primary       Assessment and Plan Assessment & Plan Preoperative Evaluation for Scheduled Surgery Preoperative evaluation for surgery on January 29th. Labs and EKG acceptable. No recent chest pain, shortness of breath, or anesthesia complications. Discussed stopping anti-inflammatory medications to reduce bleeding risk. - Cleared for surgery.  - Create preoperative packet including EKG and recent labs for anesthesia team. - Obtain recent lab results from [place] and include in preoperative packet. - Instructed to stop anti-inflammatory medications one week prior to surgery. - Advised use of Tylenol , heat, ice, and topical mentholated rubs for pain management during the week prior to surgery. - Last A1c performed at the [facility] in November was 5.1. - Per ACS  risk calculator his risk is overall low.  Knee Pain Intermittent knee pain exacerbated by walking over a mile. Managed with anti-inflammatory medications, which will be stopped one week prior to surgery. - Advised use of Tylenol , heat, ice, and topical mentholated rubs for pain management during the week prior to surgery.  Recurrent Ecchymosis and Pruritus of Arm Recurrent ecchymosis and pruritus of the arm, current episode larger and more severe. Itching managed with topical cream. - Apply over-the-counter hydrocortisone cream twice daily to affected area. - Monitor for resolution within one week and report if not improved.  EKG shows rate of 74 bpm, normal sinus rhythm.  No acute ST-T wave changes.  Possible left atrial enlargement.  EKG unchanged from prior compared to 2020.  Return if symptoms worsen or fail to improve.    Dorothyann Byars, MD [facility] at [facility]

## 2024-10-27 NOTE — Progress Notes (Signed)
 Sent message, via epic in basket, requesting orders in epic from Careers adviser.

## 2024-11-01 NOTE — Progress Notes (Incomplete)
 COVID Vaccine received:  []  No [x]  Yes Date of any COVID positive Test in last 90 days:  PCP - Dorothyann Byars, MD clearance in 09-07-24 Epic note.    [facility]?  Cardiologist -   Chest x-ray - 12-23-2020  2v in [facility]   EKG -  02-23-2019   repeat ??  [facility] [phone]?? Stress Test - 05-28-2017  ECHO - 05-28-2017  Cardiac Cath - 02-27-2019  LHC/ CORS by Dr. Burnard CT Coronary Calcium  score:   Pacemaker / ICD device [x]  No []  Yes   Spinal Cord Stimulator:[x]  No []  Yes       History of Sleep Apnea? []  No []  Yes   CPAP used?- []  No []  Yes    Medication on DOS: amlodipine (NORVASC), sertraline (ZOLOFT),pantoprazole  (PROTONIX ), nasal spray.   Patient has: [x]  NO Hx DM   []  Pre-DM   []  DM1  []   DM2 Does the patient monitor blood sugar?   [x]  N/A   []  No []  Yes  Last A1c was: 5.1 on   08-26-2024  at [facility],  Blood Thinner / Instructions: none Aspirin  Instructions: none  Activity level: Able to walk up 2 flights of stairs without becoming significantly short of breath or having chest pain?   []    Yes   []  No,  would have:  Patient can perform ADLs without assistance.  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  Patient verbalized understanding and agreement to the Pre-Surgical Instructions that were given to them at this PAT appointment. Patient was also educated of the need to review these PAT instructions again prior to his surgery.I reviewed the appropriate phone numbers to call if they have any and questions or concerns.

## 2024-11-01 NOTE — Patient Instructions (Signed)
 SURGICAL WAITING ROOM VISITATION Patients having surgery or a procedure may have no more than 2 support people in the waiting area - these visitors may rotate in the visitor waiting room.   If the patient needs to stay at the hospital during part of their recovery, the visitor guidelines for inpatient rooms apply.  PRE-OP VISITATION  Pre-op nurse will coordinate an appropriate time for 1 support person to accompany the patient in pre-op.  This support person may not rotate.  This visitor will be contacted when the time is appropriate for the visitor to come back in the pre-op area.  Please refer to the [facility] website for the visitor guidelines for Inpatients (after your surgery is over and you are in a regular room).  Temporary Visitor Restrictions  Children ages 13 and under will not be able to visit patients in [facility] under most circumstances. Visitation is not restricted outside of hospitals unless noted otherwise in the [facility] and Location Specific Visitation Guidelines at :      [email or web address] Visitors with respiratory illnesses are discouraged from visiting and should remain at home.  You are not required to quarantine at this time prior to your surgery. However, you must do this: Hand Hygiene often Do NOT share personal items Notify your provider if you are in close contact with someone who has COVID or you develop fever 100.4 or greater, new onset of sneezing, cough, sore throat, shortness of breath or body aches.  If you test positive for Covid or have been in contact with anyone that has tested positive in the last 10 days please notify you surgeon.    Your procedure is scheduled on:  THURSDAY  11-12-2024  Report to [facility] Main Entrance: [address] entrance where the [employer] is available.   Report to admitting at: 06:00    AM  Call this number if you have any questions or problems the morning of surgery [phone]  Do not eat  food after Midnight the night prior to your surgery/procedure.  After Midnight you may have the following liquids until 05:30 AM DAY OF SURGERY  Clear Liquid Diet Water  Black Coffee (sugar ok, NO MILK/CREAM OR CREAMERS)  Tea (sugar ok, NO MILK/CREAM OR CREAMERS) regular and decaf                             Plain Jell-O  with no fruit (NO RED)                                           Fruit ices (not with fruit pulp, NO RED)                                     Popsicles (NO RED)                                                                  Juice: NO CITRUS JUICES: only apple, WHITE grape, WHITE cranberry Sports drinks like Gatorade or Powerade (NO RED)  The day of surgery:  Drink ONE (1) Pre-Surgery Clear Ensure at  05:30 AM the morning of surgery. Drink in one sitting. Do not sip.  This drink was given to you during your hospital pre-op appointment visit. Nothing else to drink after completing the Pre-Surgery Clear Ensure: No candy, chewing gum or throat lozenges.    FOLLOW ANY ADDITIONAL PRE OP INSTRUCTIONS YOU RECEIVED FROM YOUR SURGEON'S OFFICE!!!   Oral Hygiene is also important to reduce your risk of infection.        Remember - BRUSH YOUR TEETH THE MORNING OF SURGERY WITH YOUR REGULAR TOOTHPASTE  Do NOT smoke after Midnight the night before surgery.  STOP TAKING all Vitamins, Herbs and supplements 1 week before your surgery.   Take ONLY these medicines the morning of surgery with A SIP OF WATER :  amlodipine (NORVASC), sertraline (ZOLOFT),pantoprazole  (PROTONIX ), nasal spray.   If You have been diagnosed with Sleep Apnea - Bring CPAP mask and tubing day of surgery. We will provide you with a CPAP machine on the day of your surgery.                   You may not have any metal on your body including jewelry, and body piercing  Do not wear  lotions, powders, cologne, or deodorant  Men may shave face and neck.  Contacts, Hearing Aids, dentures or  bridgework may not be worn into surgery. DENTURES WILL BE REMOVED PRIOR TO SURGERY PLEASE DO NOT APPLY Poly grip OR ADHESIVES!!!  Patients discharged on the day of surgery will not be allowed to drive home.  Someone NEEDS to stay with you for the first 24 hours after anesthesia.  Do not bring your home medications to the hospital. The Pharmacy will dispense medications listed on your medication list to you during your admission in the Hospital.  Please read over the following fact sheets you were given: IF YOU HAVE QUESTIONS ABOUT YOUR PRE-OP INSTRUCTIONS, PLEASE CALL [phone].      Pre-operative 4 CHG Bath Instructions   You can play a key role in reducing the risk of infection after surgery. Your skin needs to be as free of germs as possible. You can reduce the number of germs on your skin by washing with CHG (chlorhexidine  gluconate) soap before surgery. CHG is an antiseptic soap that kills germs and continues to kill germs even after washing.   DO NOT use if you have an allergy to chlorhexidine /CHG or antibacterial soaps. If your skin becomes reddened or irritated, stop using the CHG and notify one of our RNs at [phone]  Please shower with the CHG soap starting 4 days before surgery using the following schedule:  SUNDAY  11-08-2024     Do NOT use CHG soap                                                                                                                      the morning of your  surgery.         Please keep in mind the following:  DO NOT shave, including legs and underarms, starting the day of your first shower.   You may shave your face at any point before/day of surgery.  Place clean sheets on your bed the day you start using CHG soap. Use a clean washcloth (not used since being washed) for each shower. DO NOT sleep with pets once you  start using the CHG.  CHG Shower Instructions:  If you choose to wash your hair and private area, wash first with your normal shampoo/soap.  After you use shampoo/soap, rinse your hair and body thoroughly to remove shampoo/soap residue.  Turn the water  OFF and apply about 3 tablespoons (45 ml) of CHG soap to a CLEAN washcloth.  Apply CHG soap ONLY FROM YOUR NECK DOWN TO YOUR TOES (washing for 3-5 minutes)  DO NOT use CHG soap on face, private areas, open wounds, or sores.  Pay special attention to the area where your surgery is being performed.  If you are having back surgery, having someone wash your back for you may be helpful. Wait 2 minutes after CHG soap is applied, then you may rinse off the CHG soap.  Pat dry with a clean towel  Put on clean clothes/pajamas   If you choose to wear lotion, please use ONLY the CHG-compatible lotions on the back of this paper.     Additional instructions for the day of surgery: DO NOT APPLY any CHG Soap,  lotions, deodorants, cologne, or perfumes on the day of surgery  Put on clean/comfortable clothes.  Brush your teeth.  Ask your nurse before applying any prescription medications to the skin.   CHG Compatible Lotions   Aveeno Moisturizing lotion  Cetaphil Moisturizing Cream  Cetaphil Moisturizing Lotion  Clairol Herbal Essence Moisturizing Lotion, Dry Skin  Clairol Herbal Essence Moisturizing Lotion, Extra Dry Skin  Clairol Herbal Essence Moisturizing Lotion, Normal Skin  Curel Age Defying Therapeutic Moisturizing Lotion with Alpha Hydroxy  Curel Extreme Care Body Lotion  Curel Soothing Hands Moisturizing Hand Lotion  Curel Therapeutic Moisturizing Cream, Fragrance-Free  Curel Therapeutic Moisturizing Lotion, Fragrance-Free  Curel Therapeutic Moisturizing Lotion, Original Formula  Eucerin Daily Replenishing Lotion  Eucerin Dry Skin Therapy Plus Alpha Hydroxy Crme  Eucerin Dry Skin Therapy Plus Alpha Hydroxy Lotion  Eucerin Original Crme   Eucerin Original Lotion  Eucerin Plus Crme Eucerin Plus Lotion  Eucerin TriLipid Replenishing Lotion  Keri Anti-Bacterial Hand Lotion  Keri Deep Conditioning Original Lotion Dry Skin Formula Softly Scented  Keri Deep Conditioning Original Lotion, Fragrance Free Sensitive Skin Formula  Keri Lotion Fast Absorbing Fragrance Free Sensitive Skin Formula  Keri Lotion Fast Absorbing Softly Scented Dry Skin Formula  Keri Original Lotion  Keri Skin Renewal Lotion Keri Silky Smooth Lotion  Keri Silky Smooth Sensitive Skin Lotion  Nivea Body Creamy Conditioning Oil  Nivea Body Extra Enriched Lotion  Nivea Body Original Lotion  Nivea Body Sheer Moisturizing Lotion Nivea Crme  Nivea Skin Firming Lotion  NutraDerm 30 Skin Lotion  NutraDerm Skin Lotion  NutraDerm Therapeutic Skin Cream  NutraDerm Therapeutic Skin Lotion  ProShield Protective Hand Cream  Provon moisturizing lotion   FAILURE TO FOLLOW THESE INSTRUCTIONS MAY RESULT IN THE CANCELLATION OF YOUR SURGERY  PATIENT SIGNATURE_________________________________  NURSE SIGNATURE__________________________________  ________________________________________________________________________        Allen Robertson    An incentive spirometer is a tool that can help keep your lungs clear and active. This tool measures  how well you are filling your lungs with each breath. Taking long deep breaths may help reverse or decrease the chance of developing breathing (pulmonary) problems (especially infection) following: A long period of time when you are unable to move or be active. BEFORE THE PROCEDURE  If the spirometer includes an indicator to show your best effort, your nurse or respiratory therapist will set it to a desired goal. If possible, sit up straight or lean slightly forward. Try not to slouch. Hold the incentive spirometer in an upright position. INSTRUCTIONS FOR USE  Sit on the edge of your bed if possible, or sit up as far  as you can in bed or on a chair. Hold the incentive spirometer in an upright position. Breathe out normally. Place the mouthpiece in your mouth and seal your lips tightly around it. Breathe in slowly and as deeply as possible, raising the piston or the ball toward the top of the column. Hold your breath for 3-5 seconds or for as long as possible. Allow the piston or ball to fall to the bottom of the column. Remove the mouthpiece from your mouth and breathe out normally. Rest for a few seconds and repeat Steps 1 through 7 at least 10 times every 1-2 hours when you are awake. Take your time and take a few normal breaths between deep breaths. The spirometer may include an indicator to show your best effort. Use the indicator as a goal to work toward during each repetition. After each set of 10 deep breaths, practice coughing to be sure your lungs are clear. If you have an incision (the cut made at the time of surgery), support your incision when coughing by placing a pillow or rolled up towels firmly against it. Once you are able to get out of bed, walk around indoors and cough well. You may stop using the incentive spirometer when instructed by your caregiver.  RISKS AND COMPLICATIONS Take your time so you do not get dizzy or light-headed. If you are in pain, you may need to take or ask for pain medication before doing incentive spirometry. It is harder to take a deep breath if you are having pain. AFTER USE Rest and breathe slowly and easily. It can be helpful to keep track of a log of your progress. Your caregiver can provide you with a simple table to help with this. If you are using the spirometer at home, follow these instructions: SEEK MEDICAL CARE IF:  You are having difficultly using the spirometer. You have trouble using the spirometer as often as instructed. Your pain medication is not giving enough relief while using the spirometer. You develop fever of 100.5 F (38.1 C) or higher.                                                                                                     SEEK IMMEDIATE MEDICAL CARE IF:  You cough up bloody sputum that had not been present before. You develop fever of 102 F (38.9 C) or greater. You develop worsening pain at or near the incision site.  MAKE SURE YOU:  Understand these instructions. Will watch your condition. Will get help right away if you are not doing well or get worse. Document Released: 02/11/2007 Document Revised: 12/24/2011 Document Reviewed: 04/14/2007 [facility] Patient Information 2014 [place], [place].       If you would like to see a video about joint replacement:   [email or web address]

## 2024-11-02 ENCOUNTER — Ambulatory Visit
Admission: EM | Admit: 2024-11-02 | Discharge: 2024-11-02 | Disposition: A | Discharge status: Home / Self Care | Attending: Family Medicine | Admitting: Family Medicine

## 2024-11-02 ENCOUNTER — Encounter: Payer: Self-pay | Admitting: Emergency Medicine

## 2024-11-02 DIAGNOSIS — R059 Cough, unspecified: Secondary | ICD-10-CM

## 2024-11-02 DIAGNOSIS — J069 Acute upper respiratory infection, unspecified: Secondary | ICD-10-CM | POA: Diagnosis not present

## 2024-11-02 MED ORDER — PREDNISONE 20 MG PO TABS: ORAL_TABLET | ORAL | 0 refills | Status: AC

## 2024-11-02 MED ORDER — AMOXICILLIN-POT CLAVULANATE 875-125 MG PO TABS: 1.0000 | ORAL_TABLET | Freq: Two times a day (BID) | ORAL | 0 refills | Status: AC

## 2024-11-02 MED ORDER — HYDROCODONE BIT-HOMATROP MBR 5-1.5 MG/5ML PO SOLN: 5.0000 mL | Freq: Four times a day (QID) | ORAL | 0 refills | Status: AC | PRN

## 2024-11-02 NOTE — Discharge Instructions (Addendum)
 Advised patient take medication as directed with food to completion.  Advised take prednisone  with first dose of Augmentin  for the next 5 of 7 days.  Advised may use Hycodan cough syrup at night prior to sleep for cough due to sedative effects.  Encouraged increase daily water intake to 64 ounces per day while taking these medications.  Advised if symptoms worsen and/or unresolved please follow-up with PCP or here for further evaluation.

## 2024-11-02 NOTE — ED Provider Notes (Signed)
 " [facility]    CSN: [id number] Arrival date & time: 11/02/24  1047      History   Chief Complaint Chief Complaint  Patient presents with   Cough    HPI Allen Robertson is a 74 y.o. male.   HPI leasant 74 year old male presents with cough and congestion for 3 weeks.  PMH significant for SCC of tongue, HLD, and obesity.  Patient is accompanied by his wife this afternoon who reports cough is disrupting sleep for the past week.  Past Medical History:  Diagnosis Date   Allergy 2011   I broke out in a rash after taking Percocet   Anxiety    Arthritis    Cancer (HCC) 2013   tongue   Cataract 2022   No surgery   Fracture of left clavicle    2000 / also had infection had to have IV infusion therapy    GERD (gastroesophageal reflux disease)    Headache    prescribed metoprolol  for HA management   History of chicken pox    History of kidney stones    Hyperlipidemia    Hypertension    Recurrent falls    Staph skin infection    Tinnitus    Varicose veins     Patient Active Problem List   Diagnosis Date Noted   Age-related cataract of both eyes 02/26/2023   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   Unspecified macular degeneration 02/26/2023   Obesity 02/26/2023   Gout 02/26/2023   Nodule of upper lobe of left lung 02/26/2023   RLS (restless legs syndrome) 02/26/2023   Insomnia 02/26/2023   Anxiety 02/26/2023   Primary hypertension 01/24/2021   Lumbar degenerative disc disease 11/26/2017   Primary osteoarthritis of left hip 10/29/2017   Hyperlipidemia 05/28/2017   GERD (gastroesophageal reflux disease) 05/28/2017   Abnormal EKG 05/28/2017   S/P conversion right UKR to TKA 09/12/2015   History of knee surgery 09/12/2015   History of artificial joint 09/12/2015   Foreign body (FB) in soft tissue 07/08/2014   Lesion of soft tissue 07/08/2014   Joint pain 04/22/2014   Soft tissue disorder 04/22/2014   Pubic bone pain 03/31/2013   History of  arthroplasty 04/11/2012   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   Cancer of tongue (HCC) 11/23/2011   Tongue mass 11/07/2011   Snoring 10/19/2011   Varicose veins 10/19/2011    Past Surgical History:  Procedure Laterality Date   ablation calf     2009   BRISTOW PROCEDURE     left shoulder    CHOLECYSTECTOMY N/A 06/09/2019   Procedure: LAPAROSCOPIC CHOLECYSTECTOMY;  Surgeon: Vanderbilt Ned, MD;  Location: [facility] OR;  Service: General;  Laterality: N/A;   I & D EXTREMITY     right lower leg    JOINT REPLACEMENT     LEFT HEART CATH AND CORONARY ANGIOGRAPHY N/A 02/27/2019   Procedure: LEFT HEART CATH AND CORONARY ANGIOGRAPHY;  Surgeon: Burnard Ned LABOR, MD;  Location: [facility];  Service: Cardiovascular;  Laterality: N/A;   LEG SURGERY Right    LITHOTRIPSY     NASAL TURBINATE REDUCTION Bilateral    with deviated septum repair   PICC LINE PLACE PERIPHERAL ([facility] HX)     history of / secondary to right lower leg infection    REVERSE SHOULDER ARTHROPLASTY Right 11/23/2022   Procedure: REVERSE SHOULDER ARTHROPLASTY;  Surgeon: Sharl Selinda Dover, MD;  Location: [facility];  Service: Orthopedics;  Laterality: Right;  120   TONGUE SURGERY     robotic assisted with bilateral neck dissection 11/2011   TOTAL KNEE ARTHROPLASTY Right    partial   TOTAL KNEE REVISION Right 09/12/2015   Procedure: RIGHT TOTAL KNEE REVISION;  Surgeon: Donnice Car, MD;  Location: [facility];  Service: Orthopedics;  Laterality: Right;       Home Medications    Prior to Admission medications  Medication Sig Start Date End Date Taking? Authorizing Provider  amLODipine (NORVASC) 5 MG tablet Take 5 mg by mouth in the morning. 12/23/20  Yes [provider]  amoxicillin -clavulanate (AUGMENTIN ) 875-125 MG tablet Take 1 tablet by mouth every 12 (twelve) hours. 11/02/24  Yes Teddy Sharper, FNP  atorvastatin  (LIPITOR) 20 MG tablet Take 20 mg by mouth every evening.   Yes [provider]  Cyanocobalamin (VITAMIN B-12 SL) Take 1 each by mouth in the morning.   Yes [provider]  diclofenac (VOLTAREN) 75 MG EC tablet Take 75 mg by mouth 2 (two) times daily as needed (inflammation/pain.). 08/21/24  Yes [provider]  HYDROcodone  bit-homatropine (HYCODAN) 5-1.5 MG/5ML syrup Take 5 mLs by mouth every 6 (six) hours as needed for cough. 11/02/24  Yes Bricen Victory, FNP  pantoprazole  (PROTONIX ) 40 MG tablet Take 40 mg by mouth daily before breakfast.   Yes [provider]  predniSONE  (DELTASONE ) 20 MG tablet Take 3 tabs PO daily x 5 days. 11/02/24  Yes Teddy Sharper, FNP  Probiotic Product (PROBIOTIC PO) Take 1 capsule by mouth daily with lunch.   Yes [provider]  sertraline (ZOLOFT) 100 MG tablet Take 50 mg by mouth every morning. 05/24/20  Yes [provider]  sodium chloride  (OCEAN) 0.65 % SOLN nasal spray Place 1 spray into both nostrils as needed for congestion.   Yes [provider]  tamsulosin (FLOMAX) 0.4 MG CAPS capsule Take 0.4 mg by mouth every evening.   Yes [provider]  traZODone (DESYREL) 100 MG tablet Take 100 mg by mouth at bedtime as needed for sleep. 06/22/22  Yes [provider]  meloxicam (MOBIC) 15 MG tablet Take 15 mg by mouth daily.    [provider]  Multiple Vitamins-Minerals (PRESERVISION AREDS 2) CAPS Take 1 capsule by mouth in the morning and at bedtime.    [provider]    Family History Family History  Problem Relation Age of Onset   Hypertension Mother    Dementia Mother    Miscarriages / Stillbirths Mother    Stroke Mother    Leukemia Father    Headache Father    Other Father        liver aneurysm   Arthritis Father    Cancer Father    Colon cancer Maternal Grandmother 40   Esophageal cancer Neg Hx    Pancreatic cancer Neg Hx    Stomach cancer Neg Hx    Liver disease Neg Hx     Social History Social History[1]   Allergies    Oxycodone -acetaminophen , Zolpidem , and Percocet [oxycodone -acetaminophen ]   Review of Systems Review of Systems  HENT:  Positive for congestion.   Respiratory:  Positive for cough.   All other systems reviewed and are negative.    Physical Exam Triage Vital Signs ED Triage Vitals  Encounter Vitals Group     BP      Girls Systolic BP Percentile      Girls Diastolic BP Percentile      Boys Systolic BP Percentile  Boys Diastolic BP Percentile      Pulse      Resp      Temp      Temp src      SpO2      Weight      Height      Head Circumference      Peak Flow      Pain Score      Pain Loc      Pain Education      Exclude from Growth Chart    No data found.  Updated Vital Signs BP 119/80 (BP Location: Right Arm)   Pulse 88   Temp 97.9 F (36.6 C) (Oral)   Resp 18   Ht 6' 2 (1.88 m)   Wt 250 lb (113.4 kg)   SpO2 94%   BMI 32.10 kg/m    Physical Exam Vitals and nursing note reviewed.  Constitutional:      Appearance: Normal appearance. He is normal weight. He is ill-appearing.  HENT:     Head: Normocephalic and atraumatic.     Right Ear: Tympanic membrane, ear canal and external ear normal.     Left Ear: Tympanic membrane, ear canal and external ear normal.     Mouth/Throat:     Mouth: Mucous membranes are moist.     Pharynx: Oropharynx is clear.  Eyes:     Extraocular Movements: Extraocular movements intact.     Conjunctiva/sclera: Conjunctivae normal.     Pupils: Pupils are equal, round, and reactive to light.  Cardiovascular:     Rate and Rhythm: Normal rate and regular rhythm.     Heart sounds: Normal heart sounds.  Pulmonary:     Effort: Pulmonary effort is normal.     Breath sounds: Normal breath sounds. No wheezing, rhonchi or rales.     Comments: Infrequent nonproductive cough on exam Musculoskeletal:        General: Normal range of motion.  Skin:    General: Skin is warm and dry.  Neurological:     General: No focal deficit present.      Mental Status: He is alert and oriented to person, place, and time. Mental status is at baseline.  Psychiatric:        Mood and Affect: Mood normal.        Behavior: Behavior normal.      UC Treatments / Results  Labs (all labs ordered are listed, but only abnormal results are displayed) Labs Reviewed - No data to display  EKG   Radiology No results found.  Procedures Procedures (including critical care time)  Medications Ordered in UC Medications - No data to display  Initial Impression / Assessment and Plan / UC Course  I have reviewed the triage vital signs and the nursing notes.  Pertinent labs & imaging results that were available during my care of the patient were reviewed by me and considered in my medical decision making (see chart for details).     MDM: 1.  Acute URI-Rx'd Augmentin  875/225 mg tablet: Take 1 tablet twice daily x 7 days; 2.  Cough, unspecified type-Rx prednisone  20 mg tablet: Take 3 tablets p.o. daily x 5 days, Rx'd Hycodan 5-1.5 mg / 5 mL syrup: Take 5 mL every 6 hours for cough. Advised patient take medication as directed with food to completion.  Advised take prednisone  with first dose of Augmentin  for the next 5 of 7 days.  Advised may use Hycodan cough syrup at night prior  to sleep for cough due to sedative effects.  Encouraged increase daily water  intake to 64 ounces per day while taking these medications.  Advised if symptoms worsen and/or unresolved please follow-up with PCP or here for further evaluation.  Patient discharged home, hemodynamically stable. Final Clinical Impressions(s) / UC Diagnoses   Final diagnoses:  Acute URI  Cough, unspecified type     Discharge Instructions      Advised patient take medication as directed with food to completion.  Advised take prednisone  with first dose of Augmentin  for the next 5 of 7 days.  Advised may use Hycodan cough syrup at night prior to sleep for cough due to sedative effects.  Encouraged  increase daily water  intake to 64 ounces per day while taking these medications.  Advised if symptoms worsen and/or unresolved please follow-up with PCP or here for further evaluation.     ED Prescriptions     Medication Sig Dispense Auth. Provider   amoxicillin -clavulanate (AUGMENTIN ) 875-125 MG tablet Take 1 tablet by mouth every 12 (twelve) hours. 14 tablet Fermon Ureta, FNP   predniSONE  (DELTASONE ) 20 MG tablet Take 3 tabs PO daily x 5 days. 15 tablet Kaelin Bonelli, FNP   HYDROcodone  bit-homatropine (HYCODAN) 5-1.5 MG/5ML syrup Take 5 mLs by mouth every 6 (six) hours as needed for cough. 120 mL Teddy Sharper, FNP      I have reviewed the PDMP during this encounter.    [1]  Social History Tobacco Use   Smoking status: Former    Current packs/day: 0.00    Average packs/day: 1 pack/day for 10.0 years (10.0 ttl pk-yrs)    Types: Cigarettes    Start date: 10/16/1971    Quit date: 10/15/1980    Years since quitting: 44.0   Smokeless tobacco: Former    Types: Chew    Quit date: 10/16/2007  Vaping Use   Vaping status: Never Used  Substance Use Topics   Alcohol use: Yes    Alcohol/week: 14.0 standard drinks of alcohol    Types: 12 Cans of beer, 2 Shots of liquor per week    Comment: 2 xper week   Drug use: No     Teddy Sharper, FNP 11/02/24 1237  "

## 2024-11-02 NOTE — ED Triage Notes (Signed)
 Patient c/o productive cough and congestion for several weeks.  Sx's worsened x 3 days.  Body chills last night, this morning he broke out into a sweat.  Patient has taken Thera-Flu, Dayquil, Nyquil and Tussin Cough Syrup.

## 2024-11-03 ENCOUNTER — Encounter (HOSPITAL_COMMUNITY)
Admission: RE | Admit: 2024-11-03 | Discharge: 2024-11-03 | Disposition: A | Discharge status: Home / Self Care | Source: Ambulatory Visit | Attending: Orthopedic Surgery | Admitting: Orthopedic Surgery

## 2024-11-03 ENCOUNTER — Other Ambulatory Visit: Payer: Self-pay

## 2024-11-03 ENCOUNTER — Encounter (HOSPITAL_COMMUNITY): Payer: Self-pay

## 2024-11-03 VITALS — BP 130/75 | HR 98 | Temp 98.2°F | Resp 14 | Ht 74.0 in | Wt 252.0 lb

## 2024-11-03 DIAGNOSIS — I2510 Atherosclerotic heart disease of native coronary artery without angina pectoris: Secondary | ICD-10-CM | POA: Insufficient documentation

## 2024-11-03 DIAGNOSIS — M1712 Unilateral primary osteoarthritis, left knee: Secondary | ICD-10-CM | POA: Insufficient documentation

## 2024-11-03 DIAGNOSIS — Z01818 Encounter for other preprocedural examination: Secondary | ICD-10-CM

## 2024-11-03 DIAGNOSIS — Z01812 Encounter for preprocedural laboratory examination: Secondary | ICD-10-CM | POA: Diagnosis present

## 2024-11-03 LAB — BASIC METABOLIC PANEL WITH GFR
Anion gap: 12 (ref 5–15)
BUN: 16 mg/dL (ref 8–23)
CO2: 21 mmol/L — ABNORMAL LOW (ref 22–32)
Calcium: 9.5 mg/dL (ref 8.9–10.3)
Chloride: 103 mmol/L (ref 98–111)
Creatinine, Ser: 1.07 mg/dL (ref 0.61–1.24)
GFR, Estimated: >60 mL/min
Glucose, Bld: 148 mg/dL — ABNORMAL HIGH (ref 70–99)
Potassium: 4.5 mmol/L (ref 3.5–5.1)
Sodium: 136 mmol/L (ref 135–145)

## 2024-11-03 LAB — CBC
HCT: 45 % (ref 39.0–52.0)
Hemoglobin: 15.2 g/dL (ref 13.0–17.0)
MCH: 34.2 pg — ABNORMAL HIGH (ref 26.0–34.0)
MCHC: 33.8 g/dL (ref 30.0–36.0)
MCV: 101.4 fL — ABNORMAL HIGH (ref 80.0–100.0)
Platelets: 148 K/uL — ABNORMAL LOW (ref 150–400)
RBC: 4.44 MIL/uL (ref 4.22–5.81)
RDW: 12 % (ref 11.5–15.5)
WBC: 5 K/uL (ref 4.0–10.5)
nRBC: 0 % (ref 0.0–0.2)

## 2024-11-03 LAB — SURGICAL PCR SCREEN
MRSA, PCR: NEGATIVE
Staphylococcus aureus: POSITIVE — AB

## 2024-11-03 NOTE — Progress Notes (Signed)
 Patient's PCR screen is positive for STAPH. Appropriate notes have been placed on the patient's chart. This note has been routed to Dr. Ernie / Rosina Calin, PA for review. The Patient's surgery is currently scheduled for Left TKA on 11-12-24 at [facility].  Shawnee Aloe, BSN, CVRN-BC   Pre-Surgical Testing Nurse [facility]  [phone]

## 2024-11-17 ENCOUNTER — Ambulatory Visit: Admitting: Physical Therapy

## 2025-01-19 ENCOUNTER — Encounter (HOSPITAL_COMMUNITY): Admission: RE | Payer: Self-pay | Source: Ambulatory Visit

## 2025-01-19 ENCOUNTER — Ambulatory Visit (HOSPITAL_COMMUNITY): Admission: RE | Admit: 2025-01-19 | Source: Ambulatory Visit | Admitting: Orthopedic Surgery

## 2025-02-15 ENCOUNTER — Ambulatory Visit: Admitting: Family Medicine

## 2025-08-05 ENCOUNTER — Ambulatory Visit
